# Patient Record
Sex: Female | Born: 1945 | Race: White | Hispanic: No | Marital: Married | Smoking: Never smoker
Health system: Southern US, Community
[De-identification: ages and names within clinical notes are randomized; demographics above are authoritative.]

## PROBLEM LIST (undated history)

## (undated) DIAGNOSIS — K5790 Diverticulosis of intestine, part unspecified, without perforation or abscess without bleeding: Secondary | ICD-10-CM

## (undated) DIAGNOSIS — G8929 Other chronic pain: Secondary | ICD-10-CM

## (undated) DIAGNOSIS — K317 Polyp of stomach and duodenum: Secondary | ICD-10-CM

## (undated) DIAGNOSIS — R011 Cardiac murmur, unspecified: Secondary | ICD-10-CM

## (undated) DIAGNOSIS — N189 Chronic kidney disease, unspecified: Secondary | ICD-10-CM

## (undated) DIAGNOSIS — E039 Hypothyroidism, unspecified: Secondary | ICD-10-CM

## (undated) DIAGNOSIS — M8580 Other specified disorders of bone density and structure, unspecified site: Secondary | ICD-10-CM

## (undated) DIAGNOSIS — Q239 Congenital malformation of aortic and mitral valves, unspecified: Secondary | ICD-10-CM

## (undated) DIAGNOSIS — D126 Benign neoplasm of colon, unspecified: Secondary | ICD-10-CM

## (undated) DIAGNOSIS — M545 Low back pain, unspecified: Secondary | ICD-10-CM

## (undated) DIAGNOSIS — I471 Supraventricular tachycardia, unspecified: Secondary | ICD-10-CM

## (undated) DIAGNOSIS — H269 Unspecified cataract: Secondary | ICD-10-CM

## (undated) DIAGNOSIS — I10 Essential (primary) hypertension: Secondary | ICD-10-CM

## (undated) DIAGNOSIS — M1990 Unspecified osteoarthritis, unspecified site: Secondary | ICD-10-CM

## (undated) DIAGNOSIS — E785 Hyperlipidemia, unspecified: Secondary | ICD-10-CM

## (undated) DIAGNOSIS — T7840XA Allergy, unspecified, initial encounter: Secondary | ICD-10-CM

## (undated) DIAGNOSIS — K219 Gastro-esophageal reflux disease without esophagitis: Secondary | ICD-10-CM

## (undated) DIAGNOSIS — M4800 Spinal stenosis, site unspecified: Secondary | ICD-10-CM

## (undated) DIAGNOSIS — C801 Malignant (primary) neoplasm, unspecified: Secondary | ICD-10-CM

## (undated) DIAGNOSIS — K589 Irritable bowel syndrome without diarrhea: Secondary | ICD-10-CM

## (undated) HISTORY — DX: Other chronic pain: G89.29

## (undated) HISTORY — DX: Spinal stenosis, site unspecified: M48.00

## (undated) HISTORY — DX: Gastro-esophageal reflux disease without esophagitis: K21.9

## (undated) HISTORY — PX: COLONOSCOPY: SHX174

## (undated) HISTORY — DX: Low back pain: M54.5

## (undated) HISTORY — DX: Hyperlipidemia, unspecified: E78.5

## (undated) HISTORY — DX: Essential (primary) hypertension: I10

## (undated) HISTORY — PX: ABDOMINAL HYSTERECTOMY: SHX81

## (undated) HISTORY — PX: COLONOSCOPY W/ BIOPSIES: SHX1374

## (undated) HISTORY — DX: Cardiac murmur, unspecified: R01.1

## (undated) HISTORY — DX: Benign neoplasm of colon, unspecified: D12.6

## (undated) HISTORY — PX: POLYPECTOMY: SHX149

## (undated) HISTORY — DX: Hypothyroidism, unspecified: E03.9

## (undated) HISTORY — DX: Unspecified cataract: H26.9

## (undated) HISTORY — DX: Chronic kidney disease, unspecified: N18.9

## (undated) HISTORY — DX: Supraventricular tachycardia: I47.1

## (undated) HISTORY — PX: COSMETIC SURGERY: SHX468

## (undated) HISTORY — DX: Congenital malformation of aortic and mitral valves, unspecified: Q23.9

## (undated) HISTORY — DX: Polyp of stomach and duodenum: K31.7

## (undated) HISTORY — DX: Supraventricular tachycardia, unspecified: I47.10

## (undated) HISTORY — DX: Irritable bowel syndrome, unspecified: K58.9

## (undated) HISTORY — PX: SHOULDER SURGERY: SHX246

## (undated) HISTORY — DX: Allergy, unspecified, initial encounter: T78.40XA

## (undated) HISTORY — DX: Low back pain, unspecified: M54.50

## (undated) HISTORY — DX: Other specified disorders of bone density and structure, unspecified site: M85.80

## (undated) HISTORY — DX: Unspecified osteoarthritis, unspecified site: M19.90

## (undated) HISTORY — DX: Diverticulosis of intestine, part unspecified, without perforation or abscess without bleeding: K57.90

## (undated) HISTORY — DX: Malignant (primary) neoplasm, unspecified: C80.1

---

## 2000-02-21 ENCOUNTER — Encounter: Admission: RE | Admit: 2000-02-21 | Discharge: 2000-02-21 | Payer: Self-pay | Admitting: Internal Medicine

## 2000-02-21 ENCOUNTER — Encounter: Payer: Self-pay | Admitting: Internal Medicine

## 2001-02-23 ENCOUNTER — Encounter: Payer: Self-pay | Admitting: Internal Medicine

## 2001-02-23 ENCOUNTER — Encounter: Admission: RE | Admit: 2001-02-23 | Discharge: 2001-02-23 | Payer: Self-pay | Admitting: Internal Medicine

## 2001-05-19 ENCOUNTER — Other Ambulatory Visit: Admission: RE | Admit: 2001-05-19 | Discharge: 2001-05-19 | Payer: Self-pay | Admitting: Obstetrics and Gynecology

## 2001-12-17 ENCOUNTER — Ambulatory Visit (HOSPITAL_COMMUNITY): Admission: RE | Admit: 2001-12-17 | Discharge: 2001-12-17 | Payer: Self-pay | Admitting: Internal Medicine

## 2001-12-17 ENCOUNTER — Encounter (INDEPENDENT_AMBULATORY_CARE_PROVIDER_SITE_OTHER): Payer: Self-pay

## 2002-03-01 ENCOUNTER — Encounter: Admission: RE | Admit: 2002-03-01 | Discharge: 2002-03-01 | Payer: Self-pay | Admitting: Obstetrics and Gynecology

## 2002-03-01 ENCOUNTER — Encounter: Payer: Self-pay | Admitting: Internal Medicine

## 2003-03-03 ENCOUNTER — Encounter: Admission: RE | Admit: 2003-03-03 | Discharge: 2003-03-03 | Payer: Self-pay | Admitting: Internal Medicine

## 2004-02-28 ENCOUNTER — Ambulatory Visit: Payer: Self-pay | Admitting: Internal Medicine

## 2004-03-04 ENCOUNTER — Ambulatory Visit (HOSPITAL_COMMUNITY): Admission: RE | Admit: 2004-03-04 | Discharge: 2004-03-04 | Payer: Self-pay | Admitting: Internal Medicine

## 2004-03-06 ENCOUNTER — Ambulatory Visit: Payer: Self-pay | Admitting: Internal Medicine

## 2004-05-17 ENCOUNTER — Other Ambulatory Visit: Admission: RE | Admit: 2004-05-17 | Discharge: 2004-05-17 | Payer: Self-pay | Admitting: Obstetrics and Gynecology

## 2004-05-22 ENCOUNTER — Ambulatory Visit: Payer: Self-pay | Admitting: Internal Medicine

## 2004-06-19 ENCOUNTER — Ambulatory Visit: Payer: Self-pay | Admitting: Family Medicine

## 2004-08-20 ENCOUNTER — Ambulatory Visit: Payer: Self-pay | Admitting: Internal Medicine

## 2004-11-22 ENCOUNTER — Ambulatory Visit: Payer: Self-pay | Admitting: Internal Medicine

## 2004-12-03 ENCOUNTER — Ambulatory Visit: Payer: Self-pay | Admitting: Internal Medicine

## 2004-12-06 ENCOUNTER — Ambulatory Visit: Payer: Self-pay | Admitting: Internal Medicine

## 2004-12-06 ENCOUNTER — Encounter: Payer: Self-pay | Admitting: Internal Medicine

## 2005-03-04 ENCOUNTER — Ambulatory Visit: Payer: Self-pay | Admitting: Internal Medicine

## 2005-03-07 ENCOUNTER — Ambulatory Visit (HOSPITAL_COMMUNITY): Admission: RE | Admit: 2005-03-07 | Discharge: 2005-03-07 | Payer: Self-pay | Admitting: Obstetrics and Gynecology

## 2005-03-11 ENCOUNTER — Ambulatory Visit: Payer: Self-pay | Admitting: Internal Medicine

## 2005-07-04 ENCOUNTER — Ambulatory Visit: Payer: Self-pay | Admitting: Internal Medicine

## 2005-11-04 ENCOUNTER — Ambulatory Visit: Payer: Self-pay | Admitting: Internal Medicine

## 2005-11-10 ENCOUNTER — Ambulatory Visit: Payer: Self-pay | Admitting: Internal Medicine

## 2006-02-12 ENCOUNTER — Ambulatory Visit: Payer: Self-pay | Admitting: Internal Medicine

## 2006-02-12 LAB — CONVERTED CEMR LAB
ALT: 15 units/L (ref 0–40)
AST: 12 units/L (ref 0–37)
Albumin: 3.6 g/dL (ref 3.5–5.2)
Alkaline Phosphatase: 71 units/L (ref 39–117)
BUN: 15 mg/dL (ref 6–23)
Basophils Absolute: 0 10*3/uL (ref 0.0–0.1)
Basophils Relative: 0.4 % (ref 0.0–1.0)
CO2: 32 meq/L (ref 19–32)
Calcium: 8.7 mg/dL (ref 8.4–10.5)
Chloride: 103 meq/L (ref 96–112)
Chol/HDL Ratio, serum: 4.6
Cholesterol: 163 mg/dL (ref 0–200)
Creatinine, Ser: 0.9 mg/dL (ref 0.4–1.2)
Eosinophil percent: 1.9 % (ref 0.0–5.0)
GFR calc non Af Amer: 67 mL/min
Glomerular Filtration Rate, Af Am: 82 mL/min/{1.73_m2}
Glucose, Bld: 103 mg/dL — ABNORMAL HIGH (ref 70–99)
HCT: 36.4 % (ref 36.0–46.0)
HDL: 35.7 mg/dL — ABNORMAL LOW (ref >39.0)
Hemoglobin: 12.3 g/dL (ref 12.0–15.0)
LDL Cholesterol: 116 mg/dL — ABNORMAL HIGH (ref 0–99)
Lymphocytes Relative: 24.1 % (ref 12.0–46.0)
MCHC: 33.8 g/dL (ref 30.0–36.0)
MCV: 90.9 fL (ref 78.0–100.0)
Monocytes Absolute: 0.3 10*3/uL (ref 0.2–0.7)
Monocytes Relative: 7.4 % (ref 3.0–11.0)
Neutro Abs: 2.8 10*3/uL (ref 1.4–7.7)
Neutrophils Relative %: 66.2 % (ref 43.0–77.0)
Platelets: 230 10*3/uL (ref 150–400)
Potassium: 3.5 meq/L (ref 3.5–5.1)
RBC: 4 M/uL (ref 3.87–5.11)
RDW: 12.2 % (ref 11.5–14.6)
Sodium: 141 meq/L (ref 135–145)
TSH: 0.8 microintl units/mL (ref 0.35–5.50)
Total Bilirubin: 0.7 mg/dL (ref 0.3–1.2)
Total Protein: 6 g/dL (ref 6.0–8.3)
Triglyceride fasting, serum: 59 mg/dL (ref 0–149)
VLDL: 12 mg/dL (ref 0–40)
WBC: 4.2 10*3/uL — ABNORMAL LOW (ref 4.5–10.5)

## 2006-02-19 ENCOUNTER — Ambulatory Visit: Payer: Self-pay | Admitting: Internal Medicine

## 2006-03-09 ENCOUNTER — Ambulatory Visit (HOSPITAL_COMMUNITY): Admission: RE | Admit: 2006-03-09 | Discharge: 2006-03-09 | Payer: Self-pay | Admitting: Obstetrics and Gynecology

## 2006-06-18 ENCOUNTER — Ambulatory Visit: Payer: Self-pay | Admitting: Internal Medicine

## 2006-09-16 ENCOUNTER — Ambulatory Visit: Payer: Self-pay | Admitting: Internal Medicine

## 2006-11-03 DIAGNOSIS — E039 Hypothyroidism, unspecified: Secondary | ICD-10-CM

## 2006-11-03 DIAGNOSIS — N951 Menopausal and female climacteric states: Secondary | ICD-10-CM

## 2006-11-03 DIAGNOSIS — Z86010 Personal history of colonic polyps: Secondary | ICD-10-CM

## 2006-11-03 DIAGNOSIS — I10 Essential (primary) hypertension: Secondary | ICD-10-CM

## 2006-11-27 ENCOUNTER — Telehealth: Payer: Self-pay | Admitting: Internal Medicine

## 2007-01-01 ENCOUNTER — Ambulatory Visit: Payer: Self-pay | Admitting: Internal Medicine

## 2007-03-01 ENCOUNTER — Telehealth: Payer: Self-pay | Admitting: Internal Medicine

## 2007-03-01 ENCOUNTER — Ambulatory Visit: Payer: Self-pay | Admitting: Internal Medicine

## 2007-03-12 ENCOUNTER — Ambulatory Visit (HOSPITAL_COMMUNITY): Admission: RE | Admit: 2007-03-12 | Discharge: 2007-03-12 | Payer: Self-pay | Admitting: Obstetrics and Gynecology

## 2007-04-27 ENCOUNTER — Ambulatory Visit: Payer: Self-pay | Admitting: Internal Medicine

## 2007-04-27 LAB — CONVERTED CEMR LAB
ALT: 15 units/L (ref 0–35)
AST: 13 units/L (ref 0–37)
Albumin: 3.6 g/dL (ref 3.5–5.2)
Alkaline Phosphatase: 76 units/L (ref 39–117)
BUN: 19 mg/dL (ref 6–23)
Basophils Absolute: 0 10*3/uL (ref 0.0–0.1)
Basophils Relative: 0.2 % (ref 0.0–1.0)
Bilirubin Urine: NEGATIVE
Bilirubin, Direct: 0.1 mg/dL (ref 0.0–0.3)
CO2: 33 meq/L — ABNORMAL HIGH (ref 19–32)
Calcium: 8.9 mg/dL (ref 8.4–10.5)
Chloride: 100 meq/L (ref 96–112)
Cholesterol: 186 mg/dL (ref 0–200)
Creatinine, Ser: 0.8 mg/dL (ref 0.4–1.2)
Eosinophils Absolute: 0.1 10*3/uL (ref 0.0–0.6)
Eosinophils Relative: 2.1 % (ref 0.0–5.0)
GFR calc Af Amer: 93 mL/min
GFR calc non Af Amer: 77 mL/min
Glucose, Bld: 106 mg/dL — ABNORMAL HIGH (ref 70–99)
Glucose, Urine, Semiquant: NEGATIVE
HCT: 34.8 % — ABNORMAL LOW (ref 36.0–46.0)
HDL: 38.2 mg/dL — ABNORMAL LOW (ref >39.0)
Hemoglobin: 12.4 g/dL (ref 12.0–15.0)
Ketones, urine, test strip: NEGATIVE
LDL Cholesterol: 130 mg/dL — ABNORMAL HIGH (ref 0–99)
Lymphocytes Relative: 22.7 % (ref 12.0–46.0)
MCHC: 35.7 g/dL (ref 30.0–36.0)
MCV: 87.4 fL (ref 78.0–100.0)
Monocytes Absolute: 0.3 10*3/uL (ref 0.2–0.7)
Monocytes Relative: 7.3 % (ref 3.0–11.0)
Neutro Abs: 3.1 10*3/uL (ref 1.4–7.7)
Neutrophils Relative %: 67.7 % (ref 43.0–77.0)
Nitrite: NEGATIVE
Platelets: 200 10*3/uL (ref 150–400)
Potassium: 3.8 meq/L (ref 3.5–5.1)
Protein, U semiquant: NEGATIVE
RBC: 3.99 M/uL (ref 3.87–5.11)
RDW: 12.7 % (ref 11.5–14.6)
Sodium: 139 meq/L (ref 135–145)
Specific Gravity, Urine: 1.02
TSH: 3.72 microintl units/mL (ref 0.35–5.50)
Total Bilirubin: 0.9 mg/dL (ref 0.3–1.2)
Total CHOL/HDL Ratio: 4.9
Total Protein: 6 g/dL (ref 6.0–8.3)
Triglycerides: 87 mg/dL (ref 0–149)
Urobilinogen, UA: 0.2
VLDL: 17 mg/dL (ref 0–40)
WBC: 4.5 10*3/uL (ref 4.5–10.5)
pH: 7

## 2007-05-04 ENCOUNTER — Ambulatory Visit: Payer: Self-pay | Admitting: Internal Medicine

## 2007-06-15 ENCOUNTER — Ambulatory Visit: Payer: Self-pay | Admitting: Internal Medicine

## 2007-07-23 ENCOUNTER — Telehealth: Payer: Self-pay | Admitting: Internal Medicine

## 2007-09-01 ENCOUNTER — Ambulatory Visit: Payer: Self-pay | Admitting: Internal Medicine

## 2007-09-16 ENCOUNTER — Telehealth: Payer: Self-pay | Admitting: Internal Medicine

## 2007-09-22 ENCOUNTER — Encounter: Payer: Self-pay | Admitting: Internal Medicine

## 2007-12-06 ENCOUNTER — Ambulatory Visit: Payer: Self-pay | Admitting: Internal Medicine

## 2008-01-13 ENCOUNTER — Ambulatory Visit: Payer: Self-pay | Admitting: Internal Medicine

## 2008-01-18 ENCOUNTER — Encounter: Payer: Self-pay | Admitting: Internal Medicine

## 2008-02-22 ENCOUNTER — Telehealth: Payer: Self-pay | Admitting: Internal Medicine

## 2008-03-02 ENCOUNTER — Telehealth: Payer: Self-pay | Admitting: Internal Medicine

## 2008-03-10 ENCOUNTER — Telehealth: Payer: Self-pay | Admitting: Internal Medicine

## 2008-03-15 ENCOUNTER — Telehealth (INDEPENDENT_AMBULATORY_CARE_PROVIDER_SITE_OTHER): Payer: Self-pay

## 2008-03-15 ENCOUNTER — Ambulatory Visit (HOSPITAL_COMMUNITY): Admission: RE | Admit: 2008-03-15 | Discharge: 2008-03-15 | Payer: Self-pay | Admitting: Obstetrics and Gynecology

## 2008-04-28 ENCOUNTER — Ambulatory Visit: Payer: Self-pay | Admitting: Internal Medicine

## 2008-04-28 LAB — CONVERTED CEMR LAB
ALT: 12 units/L (ref 0–35)
AST: 14 units/L (ref 0–37)
Albumin: 4 g/dL (ref 3.5–5.2)
Alkaline Phosphatase: 77 units/L (ref 39–117)
BUN: 24 mg/dL — ABNORMAL HIGH (ref 6–23)
Basophils Absolute: 0 10*3/uL (ref 0.0–0.1)
Basophils Relative: 0.7 % (ref 0.0–3.0)
Bilirubin Urine: NEGATIVE
Bilirubin, Direct: 0.1 mg/dL (ref 0.0–0.3)
Blood in Urine, dipstick: NEGATIVE
CO2: 32 meq/L (ref 19–32)
Calcium: 9 mg/dL (ref 8.4–10.5)
Chloride: 104 meq/L (ref 96–112)
Cholesterol: 191 mg/dL (ref 0–200)
Creatinine, Ser: 0.9 mg/dL (ref 0.4–1.2)
Eosinophils Absolute: 0.1 10*3/uL (ref 0.0–0.7)
Eosinophils Relative: 2.5 % (ref 0.0–5.0)
GFR calc Af Amer: 81 mL/min
GFR calc non Af Amer: 67 mL/min
Glucose, Bld: 112 mg/dL — ABNORMAL HIGH (ref 70–99)
Glucose, Urine, Semiquant: NEGATIVE
HCT: 37.1 % (ref 36.0–46.0)
HDL: 42.5 mg/dL (ref >39.0)
Hemoglobin: 12.9 g/dL (ref 12.0–15.0)
Ketones, urine, test strip: NEGATIVE
LDL Cholesterol: 130 mg/dL — ABNORMAL HIGH (ref 0–99)
Lymphocytes Relative: 22.7 % (ref 12.0–46.0)
MCHC: 34.7 g/dL (ref 30.0–36.0)
MCV: 90.2 fL (ref 78.0–100.0)
Monocytes Absolute: 0.4 10*3/uL (ref 0.1–1.0)
Monocytes Relative: 7.7 % (ref 3.0–12.0)
Neutro Abs: 3.2 10*3/uL (ref 1.4–7.7)
Neutrophils Relative %: 66.4 % (ref 43.0–77.0)
Nitrite: NEGATIVE
Platelets: 203 10*3/uL (ref 150–400)
Potassium: 4 meq/L (ref 3.5–5.1)
RBC: 4.11 M/uL (ref 3.87–5.11)
RDW: 12.3 % (ref 11.5–14.6)
Sodium: 141 meq/L (ref 135–145)
Specific Gravity, Urine: 1.015
TSH: 3.76 microintl units/mL (ref 0.35–5.50)
Total Bilirubin: 0.8 mg/dL (ref 0.3–1.2)
Total CHOL/HDL Ratio: 4.5
Total Protein: 6.5 g/dL (ref 6.0–8.3)
Triglycerides: 93 mg/dL (ref 0–149)
Urobilinogen, UA: 0.2
VLDL: 19 mg/dL (ref 0–40)
WBC Urine, dipstick: NEGATIVE
WBC: 4.8 10*3/uL (ref 4.5–10.5)
pH: 7

## 2008-05-04 ENCOUNTER — Encounter: Payer: Self-pay | Admitting: Internal Medicine

## 2008-05-05 ENCOUNTER — Ambulatory Visit: Payer: Self-pay | Admitting: Internal Medicine

## 2008-09-12 ENCOUNTER — Telehealth: Payer: Self-pay | Admitting: Internal Medicine

## 2008-09-14 ENCOUNTER — Telehealth: Payer: Self-pay | Admitting: Internal Medicine

## 2008-09-15 ENCOUNTER — Ambulatory Visit: Payer: Self-pay | Admitting: Internal Medicine

## 2008-12-26 ENCOUNTER — Telehealth (INDEPENDENT_AMBULATORY_CARE_PROVIDER_SITE_OTHER): Payer: Self-pay | Admitting: *Deleted

## 2009-02-16 ENCOUNTER — Ambulatory Visit: Payer: Self-pay | Admitting: Internal Medicine

## 2009-03-20 ENCOUNTER — Ambulatory Visit (HOSPITAL_COMMUNITY): Admission: RE | Admit: 2009-03-20 | Discharge: 2009-03-20 | Payer: Self-pay | Admitting: Obstetrics and Gynecology

## 2009-04-02 ENCOUNTER — Telehealth: Payer: Self-pay | Admitting: Internal Medicine

## 2009-04-10 ENCOUNTER — Telehealth: Payer: Self-pay | Admitting: Internal Medicine

## 2009-04-21 HISTORY — PX: COLON SURGERY: SHX602

## 2009-04-21 HISTORY — PX: LUMBAR EPIDURAL INJECTION: SHX1980

## 2009-08-09 ENCOUNTER — Ambulatory Visit: Payer: Self-pay | Admitting: Internal Medicine

## 2009-08-09 LAB — CONVERTED CEMR LAB
ALT: 12 units/L (ref 0–35)
AST: 14 units/L (ref 0–37)
Albumin: 3.9 g/dL (ref 3.5–5.2)
Alkaline Phosphatase: 89 units/L (ref 39–117)
BUN: 16 mg/dL (ref 6–23)
Basophils Absolute: 0 10*3/uL (ref 0.0–0.1)
Basophils Relative: 0.3 % (ref 0.0–3.0)
Bilirubin Urine: NEGATIVE
Bilirubin, Direct: 0 mg/dL (ref 0.0–0.3)
Blood in Urine, dipstick: NEGATIVE
CO2: 29 meq/L (ref 19–32)
Calcium: 8.7 mg/dL (ref 8.4–10.5)
Chloride: 102 meq/L (ref 96–112)
Cholesterol: 191 mg/dL (ref 0–200)
Creatinine, Ser: 0.9 mg/dL (ref 0.4–1.2)
Eosinophils Absolute: 0.2 10*3/uL (ref 0.0–0.7)
Eosinophils Relative: 3.8 % (ref 0.0–5.0)
GFR calc non Af Amer: 66.53 mL/min (ref >60)
Glucose, Bld: 108 mg/dL — ABNORMAL HIGH (ref 70–99)
Glucose, Urine, Semiquant: NEGATIVE
HCT: 36 % (ref 36.0–46.0)
HDL: 46.8 mg/dL (ref >39.00)
Hemoglobin: 12.2 g/dL (ref 12.0–15.0)
Ketones, urine, test strip: NEGATIVE
LDL Cholesterol: 122 mg/dL — ABNORMAL HIGH (ref 0–99)
Lymphocytes Relative: 12.6 % (ref 12.0–46.0)
Lymphs Abs: 0.7 10*3/uL (ref 0.7–4.0)
MCHC: 33.8 g/dL (ref 30.0–36.0)
MCV: 89.3 fL (ref 78.0–100.0)
Monocytes Absolute: 0.5 10*3/uL (ref 0.1–1.0)
Monocytes Relative: 9.1 % (ref 3.0–12.0)
Neutro Abs: 3.9 10*3/uL (ref 1.4–7.7)
Neutrophils Relative %: 74.2 % (ref 43.0–77.0)
Nitrite: NEGATIVE
Platelets: 206 10*3/uL (ref 150.0–400.0)
Potassium: 3.8 meq/L (ref 3.5–5.1)
Protein, U semiquant: NEGATIVE
RBC: 4.04 M/uL (ref 3.87–5.11)
RDW: 13.6 % (ref 11.5–14.6)
Sodium: 137 meq/L (ref 135–145)
Specific Gravity, Urine: 1.02
TSH: 7.15 microintl units/mL — ABNORMAL HIGH (ref 0.35–5.50)
Total Bilirubin: 0.6 mg/dL (ref 0.3–1.2)
Total CHOL/HDL Ratio: 4
Total Protein: 6.2 g/dL (ref 6.0–8.3)
Triglycerides: 113 mg/dL (ref 0.0–149.0)
Urobilinogen, UA: 0.2
VLDL: 22.6 mg/dL (ref 0.0–40.0)
WBC: 5.2 10*3/uL (ref 4.5–10.5)
pH: 6.5

## 2009-08-14 ENCOUNTER — Telehealth: Payer: Self-pay | Admitting: Internal Medicine

## 2009-08-16 ENCOUNTER — Ambulatory Visit: Payer: Self-pay | Admitting: Internal Medicine

## 2009-08-16 DIAGNOSIS — J45909 Unspecified asthma, uncomplicated: Secondary | ICD-10-CM

## 2009-08-26 ENCOUNTER — Emergency Department (HOSPITAL_COMMUNITY): Admission: EM | Admit: 2009-08-26 | Discharge: 2009-08-26 | Payer: Self-pay | Admitting: Emergency Medicine

## 2009-08-27 ENCOUNTER — Telehealth (INDEPENDENT_AMBULATORY_CARE_PROVIDER_SITE_OTHER): Payer: Self-pay | Admitting: *Deleted

## 2009-12-04 ENCOUNTER — Encounter (INDEPENDENT_AMBULATORY_CARE_PROVIDER_SITE_OTHER): Payer: Self-pay | Admitting: *Deleted

## 2009-12-05 ENCOUNTER — Encounter (INDEPENDENT_AMBULATORY_CARE_PROVIDER_SITE_OTHER): Payer: Self-pay | Admitting: *Deleted

## 2009-12-17 ENCOUNTER — Ambulatory Visit: Payer: Self-pay | Admitting: Internal Medicine

## 2009-12-17 DIAGNOSIS — M545 Low back pain, unspecified: Secondary | ICD-10-CM | POA: Insufficient documentation

## 2009-12-18 ENCOUNTER — Telehealth: Payer: Self-pay | Admitting: Internal Medicine

## 2010-01-08 ENCOUNTER — Encounter (INDEPENDENT_AMBULATORY_CARE_PROVIDER_SITE_OTHER): Payer: Self-pay | Admitting: *Deleted

## 2010-01-09 ENCOUNTER — Ambulatory Visit: Payer: Self-pay | Admitting: Internal Medicine

## 2010-01-19 LAB — HM COLONOSCOPY

## 2010-01-23 ENCOUNTER — Ambulatory Visit: Payer: Self-pay | Admitting: Internal Medicine

## 2010-01-23 DIAGNOSIS — K5790 Diverticulosis of intestine, part unspecified, without perforation or abscess without bleeding: Secondary | ICD-10-CM

## 2010-01-23 HISTORY — DX: Diverticulosis of intestine, part unspecified, without perforation or abscess without bleeding: K57.90

## 2010-02-07 ENCOUNTER — Encounter: Payer: Self-pay | Admitting: Internal Medicine

## 2010-02-15 ENCOUNTER — Encounter: Payer: Self-pay | Admitting: Internal Medicine

## 2010-02-15 ENCOUNTER — Inpatient Hospital Stay (HOSPITAL_COMMUNITY): Admission: RE | Admit: 2010-02-15 | Discharge: 2010-02-19 | Payer: Self-pay | Admitting: Surgery

## 2010-02-15 ENCOUNTER — Encounter (INDEPENDENT_AMBULATORY_CARE_PROVIDER_SITE_OTHER): Payer: Self-pay | Admitting: Surgery

## 2010-02-15 HISTORY — PX: HEMICOLECTOMY: SHX854

## 2010-02-26 ENCOUNTER — Encounter: Payer: Self-pay | Admitting: Internal Medicine

## 2010-03-06 ENCOUNTER — Encounter: Payer: Self-pay | Admitting: Internal Medicine

## 2010-03-21 ENCOUNTER — Encounter: Payer: Self-pay | Admitting: Internal Medicine

## 2010-03-22 ENCOUNTER — Ambulatory Visit (HOSPITAL_COMMUNITY)
Admission: RE | Admit: 2010-03-22 | Discharge: 2010-03-22 | Payer: Self-pay | Source: Home / Self Care | Admitting: Obstetrics and Gynecology

## 2010-04-08 ENCOUNTER — Ambulatory Visit: Payer: Self-pay | Admitting: Internal Medicine

## 2010-04-21 HISTORY — PX: MELANOMA EXCISION: SHX5266

## 2010-04-25 ENCOUNTER — Telehealth: Payer: Self-pay | Admitting: Internal Medicine

## 2010-04-26 ENCOUNTER — Emergency Department (HOSPITAL_COMMUNITY)
Admission: EM | Admit: 2010-04-26 | Discharge: 2010-04-27 | Payer: Self-pay | Source: Home / Self Care | Admitting: Emergency Medicine

## 2010-04-30 ENCOUNTER — Ambulatory Visit
Admission: RE | Admit: 2010-04-30 | Discharge: 2010-04-30 | Payer: Self-pay | Source: Home / Self Care | Attending: Internal Medicine | Admitting: Internal Medicine

## 2010-04-30 DIAGNOSIS — R109 Unspecified abdominal pain: Secondary | ICD-10-CM | POA: Insufficient documentation

## 2010-05-06 LAB — LIPASE, BLOOD: Lipase: 29 U/L (ref 11–59)

## 2010-05-06 LAB — URINALYSIS, ROUTINE W REFLEX MICROSCOPIC
Bilirubin Urine: NEGATIVE
Hgb urine dipstick: NEGATIVE
Ketones, ur: 15 mg/dL — AB
Nitrite: NEGATIVE
Protein, ur: NEGATIVE mg/dL
Specific Gravity, Urine: 1.015 (ref 1.005–1.030)
Urine Glucose, Fasting: NEGATIVE mg/dL
Urobilinogen, UA: 1 mg/dL (ref 0.0–1.0)
pH: 5.5 (ref 5.0–8.0)

## 2010-05-06 LAB — DIFFERENTIAL
Basophils Absolute: 0 10*3/uL (ref 0.0–0.1)
Basophils Relative: 0 % (ref 0–1)
Eosinophils Absolute: 0.1 10*3/uL (ref 0.0–0.7)
Eosinophils Relative: 2 % (ref 0–5)
Lymphocytes Relative: 18 % (ref 12–46)
Lymphs Abs: 1.4 10*3/uL (ref 0.7–4.0)
Monocytes Absolute: 0.7 10*3/uL (ref 0.1–1.0)
Monocytes Relative: 9 % (ref 3–12)
Neutro Abs: 5.4 10*3/uL (ref 1.7–7.7)
Neutrophils Relative %: 71 % (ref 43–77)

## 2010-05-06 LAB — COMPREHENSIVE METABOLIC PANEL
ALT: 17 U/L (ref 0–35)
AST: 16 U/L (ref 0–37)
Albumin: 3.6 g/dL (ref 3.5–5.2)
Alkaline Phosphatase: 83 U/L (ref 39–117)
BUN: 39 mg/dL — ABNORMAL HIGH (ref 6–23)
CO2: 28 mEq/L (ref 19–32)
Calcium: 9 mg/dL (ref 8.4–10.5)
Chloride: 101 mEq/L (ref 96–112)
Creatinine, Ser: 1.39 mg/dL — ABNORMAL HIGH (ref 0.4–1.2)
GFR calc Af Amer: 46 mL/min — ABNORMAL LOW (ref >60)
GFR calc non Af Amer: 38 mL/min — ABNORMAL LOW (ref >60)
Glucose, Bld: 121 mg/dL — ABNORMAL HIGH (ref 70–99)
Potassium: 3.6 mEq/L (ref 3.5–5.1)
Sodium: 140 mEq/L (ref 135–145)
Total Bilirubin: 0.5 mg/dL (ref 0.3–1.2)
Total Protein: 5.8 g/dL — ABNORMAL LOW (ref 6.0–8.3)

## 2010-05-06 LAB — URINE MICROSCOPIC-ADD ON

## 2010-05-06 LAB — CBC
HCT: 34 % — ABNORMAL LOW (ref 36.0–46.0)
Hemoglobin: 11 g/dL — ABNORMAL LOW (ref 12.0–15.0)
MCH: 27.8 pg (ref 26.0–34.0)
MCHC: 32.4 g/dL (ref 30.0–36.0)
MCV: 86.1 fL (ref 78.0–100.0)
Platelets: 226 10*3/uL (ref 150–400)
RBC: 3.95 MIL/uL (ref 3.87–5.11)
RDW: 13.7 % (ref 11.5–15.5)
WBC: 7.6 10*3/uL (ref 4.0–10.5)

## 2010-05-06 LAB — PROTIME-INR
INR: 1.06 (ref 0.00–1.49)
Prothrombin Time: 14 seconds (ref 11.6–15.2)

## 2010-05-21 NOTE — Letter (Signed)
Summary: Previsit letter  [facility]  [address] [place], [place] [address]   Phone: [phone]  Fax: [phone]       12/05/2009 MRN: [id number]  Jasmine Wheeler [address] [place], [place]  [address]  Dear Ms. Colunga,  Welcome to the Gastroenterology Division at [facility].    You are scheduled to see a nurse for your pre-procedure visit on January 09, 2010 at 11:00am on the 3rd floor at [facility], [address]  We ask that you try to arrive at our office 15 minutes prior to your appointment time to allow for check-in.  Your nurse visit will consist of discussing your medical and surgical history, your immediate family medical history, and your medications.    Please bring a complete list of all your medications or, if you prefer, bring the medication bottles and we will list them.  We will need to be aware of both prescribed and over the counter drugs.  We will need to know exact dosage information as well.  If you are on blood thinners (Coumadin, Plavix, Aggrenox, Ticlid, etc.) please call our office today/prior to your appointment, as we need to consult with your physician about holding your medication.   Please be prepared to read and sign documents such as consent forms, a financial agreement, and acknowledgement forms.  If necessary, and with your consent, a friend or relative is welcome to sit-in on the nurse visit with you.  Please bring your insurance card so that we may make a copy of it.  If your insurance requires a referral to see a specialist, please bring your referral form from your primary care physician.  No co-pay is required for this nurse visit.     If you cannot keep your appointment, please call [phone] to cancel or reschedule prior to your appointment date.  This allows [place] the opportunity to schedule an appointment for another patient in need of care.    Thank you for choosing [facility] for your  medical needs.  We appreciate the opportunity to care for you.  Please visit [place] at our website  to learn more about our practice.                     Sincerely.                                                                                                                   The Gastroenterology Division

## 2010-05-21 NOTE — Miscellaneous (Signed)
Summary: [facility] Previsit/prep  Clinical Lists Changes  Medications: Added new medication of MOVIPREP 100 GM  SOLR (PEG-KCL-NACL-NASULF-NA ASC-C) As per prep instructions. - Signed Rx of MOVIPREP 100 GM  SOLR (PEG-KCL-NACL-NASULF-NA ASC-C) As per prep instructions.;  #1 x 0;  Signed;  Entered by: Wyona Almas RN;  Authorized by: Iva Boop MD, [facility];  Method used: Electronically to [facility] [address], [place], [place], [place]  [address], Ph: [phone], Fax: [phone] Observations: Added new observation of ALLERGY REV: Done (01/09/2010 10:48)    Prescriptions: MOVIPREP 100 GM  SOLR (PEG-KCL-NACL-NASULF-NA ASC-C) As per prep instructions.  #1 x 0   Entered by:   Wyona Almas RN   Authorized by:   Iva Boop MD, [facility]   Signed by:   Wyona Almas RN on 01/09/2010   Method used:   Electronically to        [facility]  [address] (retail)       [address]       [place], [place]  [address]       Ph: [phone]       Fax: [phone]   RxID:   [id number]

## 2010-05-21 NOTE — Letter (Signed)
Summary: Colonoscopy Letter  [facility]  [address] [place], [place] [address]   Phone: [phone]  Fax: [phone]      December 04, 2009 MRN: [id number]   Jasmine Wheeler [address] [place], [place]  [address]   Dear Ms. Walters,   According to your medical record, it is time for you to schedule a Colonoscopy. The American Cancer Society recommends this procedure as a method to detect early colon cancer. Patients with a family history of colon cancer, or a personal history of colon polyps or inflammatory bowel disease are at increased risk.  This letter has beeen generated based on the recommendations made at the time of your procedure. If you feel that in your particular situation this may no longer apply, please contact our office.  Please call our office at [phone] to schedule this appointment or to update your records at your earliest convenience.  Thank you for cooperating with [place] to provide you with the very best care possible.   Sincerely,   Iva Boop, M.D.  [facility] Gastroenterology Division [phone]

## 2010-05-21 NOTE — Procedures (Signed)
Summary: Colonoscopy  Patient: Jasmine Wheeler Note: All result statuses are Final unless otherwise noted.  Tests: (1) Colonoscopy (COL)   COL Colonoscopy           DONE     [facility]     [address]     [place], [place]  [address]           COLONOSCOPY PROCEDURE REPORT           PATIENT:  Jasmine Wheeler, Jasmine Wheeler  MR#:  [id number]     BIRTHDATE:  1946/02/21, 64 yrs. old  GENDER:  female     ENDOSCOPIST:  Iva Boop, MD, [facility]           PROCEDURE DATE:  01/23/2010     PROCEDURE:  Colonoscopy with biopsy and snare polypectomy     ASA CLASS:  Class II     INDICATIONS:  surveillance and high-risk screening, history of     pre-cancerous (adenomatous) colon polyps     MEDICATIONS:   Fentanyl 50 mcg IV, Versed 7 mg IV           DESCRIPTION OF PROCEDURE:   After the risks benefits and     alternatives of the procedure were thoroughly explained, informed     consent was obtained.  Digital rectal exam was performed and     revealed no abnormalities.   The [id number] endoscope was     introduced through the anus and advanced to the cecum, which was     identified by both the appendix and ileocecal valve, without     limitations.  The quality of the prep was good, using MoviPrep.     The instrument was then slowly withdrawn as the colon was fully     examined. Insertion: 3:07 minutes Withdrawal: 15:28 minutes     <<PROCEDUREIMAGES>>           FINDINGS:  There was a possible polyp (lobulated and sessile     appearance) seen at the appendiceal orifice. Multiple biopsies     were obtained and sent to pathology.  A sessile polyp was found in     the cecum. It was 6 - 7 mm in size. Polyp was snared without     cautery. Retrieval was successful. snare polyp  Severe     diverticulosis was found in the left colon.  This was otherwise a     normal examination of the colon.   Retroflexed views in the rectum     revealed no abnormalities.    The scope was then withdrawn from  the patient and the procedure completed.           COMPLICATIONS:  None     ENDOSCOPIC IMPRESSION:     1) Possible polyp at the appendiceal orifice - biopsied     2) 6 - 7 mm sessile polyp in the cecum     3) Severe diverticulosis in the left colon     4) Otherwise normal examination, good prep     5) Prior diminutive adenoma (2006)     6) mother dx colon cancer at 31, 2 uncles with colon cancer age of     onset not known           RECOMMENDATIONS:     If there is an adenoma at the appendix surgery will be required.           REPEAT EXAM:  In for Colonoscopy, pending biopsy results.  Iva Boop, MD, [facility]           CC:  The Patient           n.     eSIGNED:   Iva Boop at 01/23/2010 12:18 PM           Jasmine Wheeler, [id number]  Note: An exclamation mark (!) indicates a result that was not dispersed into the flowsheet. Document Creation Date: 01/23/2010 12:18 PM _______________________________________________________________________  (1) Order result status: Final Collection or observation date-time: 01/23/2010 12:08 Requested date-time:  Receipt date-time:  Reported date-time:  Referring Physician:   Ordering Physician: Stan Head [phone]) Specimen Source:  Source: Launa Grill Order Number: [id number] Lab site:   Appended Document: Colonoscopy     Procedures Next Due Date:    Colonoscopy: 01/2015

## 2010-05-21 NOTE — Progress Notes (Signed)
Summary: Call-A-Nurse Report    Call-A-Nurse Triage Call Report Triage Record Num: [id number] Operator: Migdalia Dk Patient Name: Jasmine Wheeler Call Date & Time: 08/26/2009 1:06:24AM Patient Phone: PCP: Gordy Savers Patient Gender: Female PCP Fax : [phone] Patient DOB: 1945/12/09 Practice Name: [facility] Reason for Call: Husband states pt. is vomiting. Vomiting started around 2200, no diarrhea, afebrile. Homecare advice given. Protocol(s) Used: Nausea / Vomiting Recommended Outcome per Protocol: Provide Home/Self Care Reason for Outcome: New onset of 3-4 episodes vomiting or diarrhea following mild abdominal cramping Care Advice:  ~ Call provider if symptoms do not improve after 24 hours of home care. Vomiting and Diarrhea Care: - Do not eat solid foods until vomiting subsides. - Begin taking fluids by sucking on ice chips or popsicles or taking sips of cool, clear fluids (soda, fruit juices that are low acid, sports drinks or OTC oral rehydration solution). - Gradually drink larger amounts of these fluids so that you are drinking six to eight 8 oz. (1.2 to 1.6 liters) of fluids a day. - Keep activity to a minimum. - Once vomiting and diarrhea subside, eat smaller, more frequent meals of easily digested foods such as crackers, toast, bananas, rice, cooked cereal, applesauce, broth, baked or mashed potatoes, chicken or [place] without skin. Eat slowly. - Take fluids 30 minutes before or 60 minutes after meals. - Avoid high fat, highly seasoned, high fiber or high sugar content foods. - Avoid extremely hot or cold foods. - Do not take pain medication (such as aspirin, NSAIDs) while nauseated or vomiting. - Do not drink caffeinated or alcoholic beverages.  ~ Go to the ED if you have developed signs and symptoms of dehydration such as dry mouth and tongue; increased pulse rate at rest; concentrated urine; no urine output for 8 hours or more; increasing weakness  or drowsiness, or lightheadedness when trying to sit upright or standing.  ~ 08/26/2009 1:14:34AM Page 1 of 1 [id number]

## 2010-05-21 NOTE — Letter (Signed)
Summary: [facility] Instructions  [facility]  [address] [place], [place] [address]   Phone: [phone]  Fax: [phone]       SHAREN YOUNGREN    21-Nov-1945    MRN: [id number]        Procedure Day Dorna Bloom:  [facility] 01/23/10     Arrival Time: 9:30am     Procedure Time: 10:30am     Location of Procedure:                    _X _  [facility] (4th Floor)                       PREPARATION FOR COLONOSCOPY WITH MOVIPREP   Starting 5 days prior to your procedure FRIDAY 09/30  do not eat nuts, seeds, popcorn, corn, beans, peas,  salads, or any raw vegetables.  Do not take any fiber supplements (e.g. Metamucil, Citrucel, and Benefiber).  THE DAY BEFORE YOUR PROCEDURE         DATE: TUESDAY 01/22/10  1.  Drink clear liquids the entire day-NO SOLID FOOD  2.  Do not drink anything colored red or purple.  Avoid juices with pulp.  No orange juice.  3.  Drink at least 64 oz. (8 glasses) of fluid/clear liquids during the day to prevent dehydration and help the prep work efficiently.  CLEAR LIQUIDS INCLUDE: Water Jello Ice Popsicles Tea (sugar ok, no milk/cream) Powdered fruit flavored drinks Coffee (sugar ok, no milk/cream) Gatorade Juice: apple, white grape, white cranberry  Lemonade Clear bullion, consomm, broth Carbonated beverages (any kind) Strained chicken noodle soup Hard Candy                             4.  In the morning, mix first dose of MoviPrep solution:    Empty 1 Pouch A and 1 Pouch B into the disposable container    Add lukewarm drinking water to the top line of the container. Mix to dissolve    Refrigerate (mixed solution should be used within 24 hrs)  5.  Begin drinking the prep at 5:00 p.m. The MoviPrep container is divided by 4 marks.   Every 15 minutes drink the solution down to the next mark (approximately 8 oz) until the full liter is complete.   6.  Follow completed prep with 16 oz of clear liquid of your choice  (Nothing red or purple).  Continue to drink clear liquids until bedtime.  7.  Before going to bed, mix second dose of MoviPrep solution:    Empty 1 Pouch A and 1 Pouch B into the disposable container    Add lukewarm drinking water to the top line of the container. Mix to dissolve    Refrigerate  THE DAY OF YOUR PROCEDURE      DATE:  [facility] 01/23/10  Beginning at  5:30 a.m. (5 hours before procedure):         1. Every 15 minutes, drink the solution down to the next mark (approx 8 oz) until the full liter is complete.  2. Follow completed prep with 16 oz. of clear liquid of your choice.    3. You may drink clear liquids until 8:30am  (2 HOURS BEFORE PROCEDURE).   MEDICATION INSTRUCTIONS  Unless otherwise instructed, you should take regular prescription medications with a small sip of water   as early as possible the morning of your  procedure.   Additional medication instructions: Hold Spironolactone the morning of procedure.         OTHER INSTRUCTIONS  You will need a responsible adult at least 67 years of age to accompany you and drive you home.   This person must remain in the waiting room during your procedure.  Wear loose fitting clothing that is easily removed.  Leave jewelry and other valuables at home.  However, you may wish to bring a book to read or  an iPod/MP3 player to listen to music as you wait for your procedure to start.  Remove all body piercing jewelry and leave at home.  Total time from sign-in until discharge is approximately 2-3 hours.  You should go home directly after your procedure and rest.  You can resume normal activities the  day after your procedure.  The day of your procedure you should not:   Drive   Make legal decisions   Operate machinery   Drink alcohol   Return to work  You will receive specific instructions about eating, activities and medications before you leave.    The above instructions have been reviewed and  explained to me by  Wyona Almas RN  January 09, 2010 11:24 AM     I fully understand and can verbalize these instructions _____________________________ Date _________

## 2010-05-21 NOTE — Consult Note (Signed)
Summary: [facility]   Imported By: Maryln Gottron 02/19/2010 12:19:38  _____________________________________________________________________  External Attachment:    Type:   Image     Comment:   External Document

## 2010-05-21 NOTE — Letter (Signed)
Summary: [facility]   Imported By: Sherian Rein 03/20/2010 10:25:25  _____________________________________________________________________  External Attachment:    Type:   Image     Comment:   External Document

## 2010-05-21 NOTE — Assessment & Plan Note (Signed)
Summary: 4 month follow up/cjr   Vital Signs:  Patient profile:   65 year old female Weight:      172 pounds Temp:     98.0 degrees F oral BP sitting:   138 / 74  (right arm) Cuff size:   regular  Vitals Entered By: Duard Brady LPN (December 17, 2009 9:56 AM) CC: 4 mos rov - doing ok - being seen by ortho for back problems Is Patient Diabetic? No Flu Vaccine Consent Questions     Do you have a history of severe allergic reactions to this vaccine? no    Any prior history of allergic reactions to egg and/or gelatin? no    Do you have a sensitivity to the preservative Thimersol? no    Do you have a past history of Guillan-Barre Syndrome? no    Do you currently have an acute febrile illness? no    Have you ever had a severe reaction to latex? no    Vaccine information given and explained to patient? yes    Are you currently pregnant? no    Lot Number:[id number]   Exp Date:10/19/2010   Site Given  Left Deltoid IM   CC:  4 mos rov - doing ok - being seen by ortho for back problems.  History of Present Illness: 65 year old patient who is seen today for follow-up of her hypertension.  She has multi-drug-resistant hypertension, which has been well-controlled.  She has been followed closely by orthopedics due to low back pain.  This is associated with some right leg radiculopathy.  She has been treated aggressively but conservatively due to an apparent L3-4 disk.  She has hypothyroidism.  Laboratory studies were checked last spring.  She has a history of asthma, which has been stable  Allergies: 1)  Uniphyl 2)  Adalat Cc (Nifedipine) 3)  Proventil  Past History:  Past Medical History: Hypertension Hypothyroidism Colonic polyps, hx of Asthma Low back pain L3-4/ R radiculopathy ([facility])  Past Surgical History: Reviewed history from 08/16/2009 and no changes required. Hysterectomy complete Caesarean section Colonoscopy-12/06/2004 bone density 2006 2008,  02-2009  Review of Systems  The patient denies anorexia, fever, weight loss, weight gain, vision loss, decreased hearing, hoarseness, chest pain, syncope, dyspnea on exertion, peripheral edema, prolonged cough, headaches, hemoptysis, abdominal pain, melena, hematochezia, severe indigestion/heartburn, hematuria, incontinence, genital sores, muscle weakness, suspicious skin lesions, transient blindness, difficulty walking, depression, unusual weight change, abnormal bleeding, enlarged lymph nodes, angioedema, and breast masses.    Physical Exam  General:  overweight-appearing.  130/74overweight-appearing.   Head:  Normocephalic and atraumatic without obvious abnormalities. No apparent alopecia or balding. Eyes:  No corneal or conjunctival inflammation noted. EOMI. Perrla. Funduscopic exam benign, without hemorrhages, exudates or papilledema. Vision grossly normal. Mouth:  Oral mucosa and oropharynx without lesions or exudates.  Teeth in good repair. Neck:  No deformities, masses, or tenderness noted. Lungs:  Normal respiratory effort, chest expands symmetrically. Lungs are clear to auscultation, no crackles or wheezes. Heart:  Normal rate and regular rhythm. S1 and S2 normal without gallop, murmur, click, rub or other extra sounds. Abdomen:  Bowel sounds positive,abdomen soft and non-tender without masses, organomegaly or hernias noted. Msk:  No deformity or scoliosis noted of thoracic or lumbar spine.   Pulses:  R and L carotid,radial,femoral,dorsalis pedis and posterior tibial pulses are full and equal bilaterally Extremities:  No clubbing, cyanosis, edema, or deformity noted with normal full range of motion of all joints.     Impression &  Recommendations:  Problem # 1:  LOW BACK PAIN (ICD-724.2)  Her updated medication list for this problem includes:    Skelaxin 800 Mg Tabs (Metaxalone) ..... One three times daily as needed    Adult Aspirin Ec Low Strength 81 Mg Tbec (Aspirin) .Marland Kitchen... 1  once daily    Tramadol Hcl 50 Mg Tabs (Tramadol hcl) ..... Q6 hrs as needed pain    Hydrocodone-acetaminophen 5-325 Mg Tabs (Hydrocodone-acetaminophen) .Marland Kitchen... At bedtime as needed pain  Her updated medication list for this problem includes:    Skelaxin 800 Mg Tabs (Metaxalone) ..... One three times daily as needed    Adult Aspirin Ec Low Strength 81 Mg Tbec (Aspirin) .Marland Kitchen... 1 once daily    Tramadol Hcl 50 Mg Tabs (Tramadol hcl) ..... Q6 hrs as needed pain    Hydrocodone-acetaminophen 5-325 Mg Tabs (Hydrocodone-acetaminophen) .Marland Kitchen... At bedtime as needed pain  Problem # 2:  HYPERTENSION (ICD-401.9)  Her updated medication list for this problem includes:    Spironolactone 25 Mg Tabs (Spironolactone) .Marland Kitchen... 1 once daily    Quinapril Hcl 40 Mg Tabs (Quinapril hcl) .Marland Kitchen... 1 two times a day    Diltiazem Hcl Er Beads 360 Mg Xr24h-cap (Diltiazem hcl er beads) ..... One every morning    Her updated medication list for this problem includes:    Spironolactone 25 Mg Tabs (Spironolactone) .Marland Kitchen... 1 once daily    Quinapril Hcl 40 Mg Tabs (Quinapril hcl) .Marland Kitchen... 1 two times a day    Diltiazem Hcl Er Beads 360 Mg Xr24h-cap (Diltiazem hcl er beads) ..... One every morning  Orders: Prescription Created Electronically [phone])  Problem # 3:  HYPOTHYROIDISM (ICD-244.9)  Her updated medication list for this problem includes:    Synthroid 200 Mcg Tabs (Levothyroxine sodium) .Marland Kitchen... Take 1 tablet by mouth once a day  Her updated medication list for this problem includes:    Synthroid 200 Mcg Tabs (Levothyroxine sodium) .Marland Kitchen... Take 1 tablet by mouth once a day  Complete Medication List: 1)  Synthroid 200 Mcg Tabs (Levothyroxine sodium) .... Take 1 tablet by mouth once a day 2)  Zyrtec Allergy 10 Mg Tabs (Cetirizine hcl) .Marland Kitchen.. 1 once daily as needed 3)  Spironolactone 25 Mg Tabs (Spironolactone) .Marland Kitchen.. 1 once daily 4)  Quinapril Hcl 40 Mg Tabs (Quinapril hcl) .Marland Kitchen.. 1 two times a day 5)  Combivent 103-18 Mcg/act Aero  (Albuterol-ipratropium) .... Use as dir as needed 6)  Metoprolol Tartrate 50 Mg Tabs (metoprolol Tartrate)  .... One twice daily 7)  Skelaxin 800 Mg Tabs (Metaxalone) .... One three times daily as needed 8)  Adult Aspirin Ec Low Strength 81 Mg Tbec (Aspirin) .Marland Kitchen.. 1 once daily 9)  Diltiazem Hcl Er Beads 360 Mg Xr24h-cap (Diltiazem hcl er beads) .... One every morning 10)  Tramadol Hcl 50 Mg Tabs (Tramadol hcl) .... Q6 hrs as needed pain 11)  Hydrocodone-acetaminophen 5-325 Mg Tabs (Hydrocodone-acetaminophen) .... At bedtime as needed pain 12)  Vitamin C 500 Mg Tabs (Ascorbic acid) .... Qd 13)  Omega-3 Krill Oil 300 Mg Caps (Krill oil) .... Qd 14)  Caltrate 600+d 600-400 Mg-unit Tabs (Calcium carbonate-vitamin d) 15)  Multivitamins Tabs (Multiple vitamin) .... Qd  Other Orders: Admin 1st Vaccine (60454) Flu Vaccine 17yrs + (09811)  Patient Instructions: 1)  Please schedule a follow-up appointment in 4 months. 2)  Limit your Sodium (Salt). 3)  It is important that you exercise regularly at least 20 minutes 5 times a week. If you develop chest pain, have severe difficulty breathing, or  feel very tired , stop exercising immediately and seek medical attention. 4)  You need to lose weight. Consider a lower calorie diet and regular exercise.  Prescriptions: DILTIAZEM HCL ER BEADS 360 MG XR24H-CAP (DILTIAZEM HCL ER BEADS) one every morning  #90 x 6   Entered and Authorized by:   Gordy Savers  MD   Signed by:   Gordy Savers  MD on 12/17/2009   Method used:   Electronically to        [facility]  [address] (retail)       [address]       [place], [place]  [address]       Ph: [phone]       Fax: [phone]   RxID:   [id number] COMBIVENT 103-18 MCG/ACT  AERO (ALBUTEROL-IPRATROPIUM) use as dir as needed  #3 x 6   Entered and Authorized by:   Gordy Savers  MD   Signed by:   Gordy Savers  MD on 12/17/2009   Method used:   Electronically to         [facility]  [address] (retail)       [address]       [place], [place]  [address]       Ph: [phone]       Fax: [phone]   RxID:   [id number] QUINAPRIL HCL 40 MG  TABS (QUINAPRIL HCL) 1 two times a day  #180 x 6   Entered and Authorized by:   Gordy Savers  MD   Signed by:   Gordy Savers  MD on 12/17/2009   Method used:   Electronically to        [facility]  [address] [address] (retail)       [address]       [place], [place]  [address]       Ph: [phone]       Fax: [phone]   RxID:   [id number] SPIRONOLACTONE 25 MG  TABS (SPIRONOLACTONE) 1 once daily  #90 x 6   Entered and Authorized by:   Gordy Savers  MD   Signed by:   Gordy Savers  MD on 12/17/2009   Method used:   Electronically to        [facility]  [address] (retail)       [address]       [place], [place]  [address]       Ph: [phone]       Fax: [phone]   RxID:   [id number] SYNTHROID 200 MCG TABS (LEVOTHYROXINE SODIUM) Take 1 tablet by mouth once a day  #90 x 6   Entered and Authorized by:   Gordy Savers  MD   Signed by:   Gordy Savers  MD on 12/17/2009   Method used:   Electronically to        [facility]  [address] (retail)       [address]       [place], [place]  [address]       Ph: [phone]       Fax: [phone]   RxID:   [id number]

## 2010-05-21 NOTE — Assessment & Plan Note (Signed)
Summary: CPX /RS   Vital Signs:  Patient profile:   65 year old female Height:      62 inches Weight:      160 pounds BMI:     29.37 O2 Sat:      96 % on Room air Temp:     97.7 degrees F oral BP sitting:   122 / 60  (left arm) Cuff size:   regular  Vitals Entered By: Duard Brady LPN (August 16, 2009 9:35 AM)  O2 Flow:  Room air CC: cpx doing well colo do 11/2009 mammo/pap within normal limits   CC:  cpx doing well colo do 11/2009 mammo/pap within normal limits.  History of Present Illness: 65 year old patient who is seen today for a comprehensive evaluation.  Medical constant with a multi-drug-resistant hypertension.  She has hypothyroidism.  History colonic polyps and a history of mild asthma.  For the past, week she's had increasing chest congestion, cough, and some wheezing.  She is followed closely by gynecology.  Preventive Screening-Counseling & Management  Alcohol-Tobacco     Smoking Status: never  Allergies: 1)  Uniphyl 2)  Adalat Cc (Nifedipine) 3)  Proventil  Past History:  Past Medical History: Hypertension Hypothyroidism Colonic polyps, hx of Asthma  Past Surgical History: Hysterectomy complete Caesarean section Colonoscopy-12/06/2004 bone density 2006 2008, 02-2009  Family History: Reviewed history from 05/05/2008 and no changes required. Family History Lung cancer Family History Other cancer-Cervical Fam hx CAD Fam hx COPD Family History Diabetes 1st degree relative father died at age 17, lung cancer.   Mother history coronary disease, hypertension, COPD, hypothyroidism, CAD; history of rectal cancer  one brother one sister.  Positive for CAD  s/p CABG and cervical cancer (Sister died age 33) MGM-CNS Ca 2  maternal uncles-colon ca  Social History: Reviewed history from 05/05/2008 and no changes required. Married works at [employer] Status:  never  Review of Systems  The patient denies anorexia, fever, weight loss, weight gain,  vision loss, decreased hearing, hoarseness, chest pain, syncope, dyspnea on exertion, peripheral edema, prolonged cough, headaches, hemoptysis, abdominal pain, melena, hematochezia, severe indigestion/heartburn, hematuria, incontinence, genital sores, muscle weakness, suspicious skin lesions, transient blindness, difficulty walking, depression, unusual weight change, abnormal bleeding, enlarged lymph nodes, angioedema, and breast masses.    Physical Exam  General:  Well-developed,well-nourished,in no acute distress; alert,appropriate and cooperative throughout examination; 120/70 Head:  Normocephalic and atraumatic without obvious abnormalities. No apparent alopecia or balding. Eyes:  No corneal or conjunctival inflammation noted. EOMI. Perrla. Funduscopic exam benign, without hemorrhages, exudates or papilledema. Vision grossly normal. Ears:  External ear exam shows no significant lesions or deformities.  Otoscopic examination reveals clear canals, tympanic membranes are intact bilaterally without bulging, retraction, inflammation or discharge. Hearing is grossly normal bilaterally. Nose:  External nasal examination shows no deformity or inflammation. Nasal mucosa are pink and moist without lesions or exudates. Mouth:  Oral mucosa and oropharynx without lesions or exudates.  Teeth in good repair. Neck:  No deformities, masses, or tenderness noted. Chest Wall:  No deformities, masses, or tenderness noted. Breasts:  No mass, nodules, thickening, tenderness, bulging, retraction, inflamation, nipple discharge or skin changes noted.   Lungs:  Normal respiratory effort, chest expands symmetrically. Lungs are clear to auscultation, no crackles or wheezes. Heart:  Normal rate and regular rhythm. S1 and S2 normal without gallop, murmur, click, rub or other extra sounds. Abdomen:  Bowel sounds positive,abdomen soft and non-tender without masses, organomegaly or hernias noted. Msk:  No deformity or  scoliosis  noted of thoracic or lumbar spine.   Pulses:  R and L carotid,radial,femoral,dorsalis pedis and posterior tibial pulses are full and equal bilaterally Extremities:  No clubbing, cyanosis, edema, or deformity noted with normal full range of motion of all joints.   Neurologic:  No cranial nerve deficits noted. Station and gait are normal. Plantar reflexes are down-going bilaterally. DTRs are symmetrical throughout. Sensory, motor and coordinative functions appear intact. Skin:  Intact without suspicious lesions or rashes Cervical Nodes:  No lymphadenopathy noted Axillary Nodes:  No palpable lymphadenopathy Inguinal Nodes:  No significant adenopathy Psych:  Cognition and judgment appear intact. Alert and cooperative with normal attention span and concentration. No apparent delusions, illusions, hallucinations   Impression & Recommendations:  Problem # 1:  ASTHMA (ICD-493.90)  Her updated medication list for this problem includes:    Combivent 103-18 Mcg/act Aero (Albuterol-ipratropium) ..... Use as dir as needed  Her updated medication list for this problem includes:    Combivent 103-18 Mcg/act Aero (Albuterol-ipratropium) ..... Use as dir as needed  Problem # 2:  HYPOTHYROIDISM (ICD-244.9)  Her updated medication list for this problem includes:    Synthroid 200 Mcg Tabs (Levothyroxine sodium) .Marland Kitchen... Take 1 tablet by mouth once a day  Her updated medication list for this problem includes:    Synthroid 200 Mcg Tabs (Levothyroxine sodium) .Marland Kitchen... Take 1 tablet by mouth once a day  Problem # 3:  HYPERTENSION (ICD-401.9)  The following medications were removed from the medication list:    Diltiazem Hcl Cr 180 Mg Cp24 (Diltiazem hcl) .Marland Kitchen... 2 daily a.m. and 1 p.m. Her updated medication list for this problem includes:    Spironolactone 25 Mg Tabs (Spironolactone) .Marland Kitchen... 1 once daily    Quinapril Hcl 40 Mg Tabs (Quinapril hcl) .Marland Kitchen... 1 two times a day    Diltiazem Hcl Er Beads 360 Mg Xr24h-cap  (Diltiazem hcl er beads) ..... One every morning  Orders: EKG w/ Interpretation (93000)  Her updated medication list for this problem includes:    Spironolactone 25 Mg Tabs (Spironolactone) .Marland Kitchen... 1 once daily    Quinapril Hcl 40 Mg Tabs (Quinapril hcl) .Marland Kitchen... 1 two times a day    Diltiazem Hcl Cr 180 Mg Cp24 (Diltiazem hcl) .Marland Kitchen... 2 daily a.m. and 1 p.m.  Problem # 4:  Preventive Health Care (ICD-V70.0)  Complete Medication List: 1)  Synthroid 200 Mcg Tabs (Levothyroxine sodium) .... Take 1 tablet by mouth once a day 2)  Zyrtec Allergy 10 Mg Tabs (Cetirizine hcl) .Marland Kitchen.. 1 once daily as needed 3)  Spironolactone 25 Mg Tabs (Spironolactone) .Marland Kitchen.. 1 once daily 4)  Quinapril Hcl 40 Mg Tabs (Quinapril hcl) .Marland Kitchen.. 1 two times a day 5)  Combivent 103-18 Mcg/act Aero (Albuterol-ipratropium) .... Use as dir as needed 6)  Metoprolol Tartrate 50 Mg Tabs (metoprolol Tartrate)  .... One twice daily 7)  Skelaxin 800 Mg Tabs (Metaxalone) .... One three times daily as needed 8)  Adult Aspirin Ec Low Strength 81 Mg Tbec (Aspirin) .Marland Kitchen.. 1 once daily 9)  Diltiazem Hcl Er Beads 360 Mg Xr24h-cap (Diltiazem hcl er beads) .... One every morning  Patient Instructions: 1)  Please schedule a follow-up appointment in 4 months. 2)  Limit your Sodium (Salt). 3)  It is important that you exercise regularly at least 20 minutes 5 times a week. If you develop chest pain, have severe difficulty breathing, or feel very tired , stop exercising immediately and seek medical attention. 4)  You need to lose weight. Consider  a lower calorie diet and regular exercise.  5)  Schedule a colonoscopy/sigmoidoscopy to help detect colon cancer. 6)  Take calcium +Vitamin D daily. 7)  Check your Blood Pressure regularly. If it is above: 150/90 you should make an appointment. Prescriptions: DILTIAZEM HCL ER BEADS 360 MG XR24H-CAP (DILTIAZEM HCL ER BEADS) one every morning  #90 x 6   Entered and Authorized by:   Gordy Savers  MD    Signed by:   Gordy Savers  MD on 08/16/2009   Method used:   Print then Give to Patient   RxID:   [id number] SKELAXIN 800 MG  TABS (METAXALONE) one three times daily as needed  #40 x 0   Entered and Authorized by:   Gordy Savers  MD   Signed by:   Gordy Savers  MD on 08/16/2009   Method used:   Print then Give to Patient   RxID:   [id number] COMBIVENT 103-18 MCG/ACT  AERO (ALBUTEROL-IPRATROPIUM) use as dir as needed  #3 x 6   Entered and Authorized by:   Gordy Savers  MD   Signed by:   Gordy Savers  MD on 08/16/2009   Method used:   Print then Give to Patient   RxID:   [id number] QUINAPRIL HCL 40 MG  TABS (QUINAPRIL HCL) 1 two times a day  #180 x 6   Entered and Authorized by:   Gordy Savers  MD   Signed by:   Gordy Savers  MD on 08/16/2009   Method used:   Print then Give to Patient   RxID:   [id number] SPIRONOLACTONE 25 MG  TABS (SPIRONOLACTONE) 1 once daily  #90 x 6   Entered and Authorized by:   Gordy Savers  MD   Signed by:   Gordy Savers  MD on 08/16/2009   Method used:   Print then Give to Patient   RxID:   [id number] WCBJSE ALLERGY 10 MG TABS (CETIRIZINE HCL) 1 once daily as needed  #90 x 6   Entered and Authorized by:   Gordy Savers  MD   Signed by:   Gordy Savers  MD on 08/16/2009   Method used:   Print then Give to Patient   RxID:   [id number] SYNTHROID 200 MCG TABS (LEVOTHYROXINE SODIUM) Take 1 tablet by mouth once a day  #90 x 6   Entered and Authorized by:   Gordy Savers  MD   Signed by:   Gordy Savers  MD on 08/16/2009   Method used:   Print then Give to Patient   RxID:   [id number] DILTIAZEM HCL ER BEADS 360 MG XR24H-CAP (DILTIAZEM HCL ER BEADS) one every morning  #90 x 6   Entered and Authorized by:   Gordy Savers  MD   Signed by:   Gordy Savers  MD on 08/16/2009   Method used:   Electronically to        [facility]  [address] [facility] ([facility]ail)       [address]       [place], [place]  [address]       Ph: [phone]       Fax: [phone]   RxID:   [id number] DILTIAZEM HCL ER BEADS 360 MG XR24H-CAP (DILTIAZEM HCL ER BEADS) one every morning  #90 x 6   Entered and Authorized by:  Gordy Savers  MD   Signed by:   Gordy Savers  MD on 08/16/2009   Method used:   Electronically to        [facility] [address] (retail)       [address]       [place], [place]  [address]       Ph: [phone]       Fax: [phone]   RxID:   [id number] DILTIAZEM HCL CR 180 MG  CP24 (DILTIAZEM HCL) 2 daily a.m. and 1 p.m.  #270 x 3   Entered and Authorized by:   Gordy Savers  MD   Signed by:   Gordy Savers  MD on 08/16/2009   Method used:   Electronically to        [facility] [address] (retail)       [address]       [place], [place]  [address]       Ph: [phone]       Fax: [phone]   RxID:   [id number] SKELAXIN 800 MG  TABS (METAXALONE) one three times daily as needed  #40 x 0   Entered and Authorized by:   Gordy Savers  MD   Signed by:   Gordy Savers  MD on 08/16/2009   Method used:   Electronically to        [facility] [address] [facility] ([facility]ail)       [address]       [place], [place]  [address]       Ph: [phone]       Fax: [phone]   RxID:   [id number] METOPROLOL TARTRATE 50 MG  TABS (METOPROLOL TARTRATE) one twice daily  #180 x 6   Entered and Authorized by:   Gordy Savers  MD   Signed by:   Gordy Savers  MD on 08/16/2009   Method used:   Print then Give to Patient   RxID:   [id number] COMBIVENT 103-18 MCG/ACT  AERO (ALBUTEROL-IPRATROPIUM) use as dir as needed  #3 x 6   Entered and Authorized by:   Gordy Savers  MD   Signed by:   Gordy Savers  MD on 08/16/2009   Method used:   Electronically to        [facility] [address] [facility] ([facility]ail)        [address]       [place], [place]  [address]       Ph: [phone]       Fax: [phone]   RxID:   [id number] QUINAPRIL HCL 40 MG  TABS (QUINAPRIL HCL) 1 two times a day  #180 x 6   Entered and Authorized by:   Gordy Savers  MD   Signed by:   Gordy Savers  MD on 08/16/2009   Method used:   Electronically to        [facility] [address] [facility] ([facility])       [address]       [place], [place]  [address]       Ph: [phone]       Fax: [phone]   RxID:  [id number] SPIRONOLACTONE 25 MG  TABS (SPIRONOLACTONE) 1 once daily  #90 x 6   Entered and Authorized by:   Gordy Savers  MD   Signed by:   Gordy Savers  MD on 08/16/2009   Method used:   Electronically to        [facility] [address] (retail)       [address]       [place], [place]  [address]       Ph: [phone]       Fax: [phone]   RxID:   [id number] ZYRTEC ALLERGY 10 MG TABS (CETIRIZINE HCL) 1 once daily as needed  #90 x 6   Entered and Authorized by:   Gordy Savers  MD   Signed by:   Gordy Savers  MD on 08/16/2009   Method used:   Electronically to        [facility] [address] [facility])       [address]       [place], [place]  [address]       Ph: [phone]       Fax: [phone]   RxID:   [id number] SYNTHROID 200 MCG TABS (LEVOTHYROXINE SODIUM) Take 1 tablet by mouth once a day  #90 x 6   Entered and Authorized by:   Gordy Savers  MD   Signed by:   Gordy Savers  MD on 08/16/2009   Method used:   Electronically to        [facility] [address] [facility])       [address]       [place], [place]  [address]       Ph: [phone]       Fax: [phone]   RxID:   [id number]

## 2010-05-21 NOTE — Progress Notes (Signed)
Summary: Chest Cold  Phone Note Call from Patient Call back at [phone]   Caller: Patient Summary of Call: Cold deep in chest and has asthma would like to have something called to help break up the congestion. [facility]  Initial call taken by: Trixie Dredge,  August 14, 2009 10:14 AM  Follow-up for Phone Call        Joen Laura DM OTC Follow-up by: Gordy Savers  MD,  August 14, 2009 5:08 PM  Additional Follow-up for Phone Call Additional follow up Details #1::        called pt - told to use Muccinex DM but to make sure to take with a full glass of water - has cpx thurs - can address then if no better. KIK Additional Follow-up by: Duard Brady LPN,  August 14, 2009 5:20 PM

## 2010-05-21 NOTE — Progress Notes (Signed)
Summary: [facility]  Phone Note Call from Patient Call back at Home Phone [phone]   Caller: Patient---live call Summary of Call: was seen yesterday and her meds were sent to [facility] Please send the ones from yesterday to [facility]. Initial call taken by: Warnell Forester,  December 18, 2009 9:45 AM  Follow-up for Phone Call        [facility] - attempt to call pt - ans mach - LMTCB if questions - request completed.KIK Follow-up by: Duard Brady LPN,  December 18, 2009 10:00 AM    Prescriptions: DILTIAZEM HCL ER BEADS 360 MG XR24H-CAP (DILTIAZEM HCL ER BEADS) one every morning  #90 x 6   Entered by:   Duard Brady LPN   Authorized by:   Gordy Savers  MD   Signed by:   Duard Brady LPN on 09/81/1914   Method used:   Faxed to ...       [facility] education officer)       [address] [address]       [place], [place]  [address]       Ph: [phone]       Fax: [phone]   RxID:   [id number] COMBIVENT 103-18 MCG/ACT  AERO (ALBUTEROL-IPRATROPIUM) use as dir as needed  #3 x 6   Entered by:   Duard Brady LPN   Authorized by:   Gordy Savers  MD   Signed by:   Duard Brady LPN on 40/34/7425   Method used:   Faxed to ...       [facility] Environmental education officer)       [address] [address]       [place], [place]  [address]       Ph: [phone]       Fax: [phone]   RxID:   [id number] QUINAPRIL HCL 40 MG  TABS (QUINAPRIL HCL) 1 two times a day  #180 x 6   Entered by:   Duard Brady LPN   Authorized by:   Gordy Savers  MD   Signed by:   Duard Brady LPN on 25/42/7062   Method used:   Faxed to ...       [facility] Environmental education officer)       [address] [address]       [place], [place]  [address]       Ph: [phone]       Fax: [phone]   RxID:   [id number] SPIRONOLACTONE 25 MG  TABS (SPIRONOLACTONE) 1 once daily  #90 x 6   Entered by:   Duard Brady LPN   Authorized by:   Gordy Savers  MD   Signed by:   Duard Brady LPN on 99/37/1696   Method used:   Faxed to ...       [facility] Environmental education officer)       [address] [address]       [place], [place]  [address]       Ph: [phone]       Fax: [phone]   RxID:   [id number] SYNTHROID 200 MCG TABS (LEVOTHYROXINE SODIUM) Take 1 tablet by mouth once a day  #90 x 6   Entered by:   Duard Brady LPN   Authorized by:   Gordy Savers  MD   Signed by:   Duard Brady LPN on 95/12/3265   Method used:   Faxed to ...       [facility] Environmental education officer)  [address] [address]       [place], [place]  [address]       Ph: [phone]       Fax: [phone]   RxID:   [id number]

## 2010-05-22 ENCOUNTER — Encounter: Payer: Self-pay | Admitting: Emergency Medicine

## 2010-05-23 NOTE — Letter (Signed)
Summary: [facility]   Imported By: Sherian Rein 04/23/2010 10:52:41  _____________________________________________________________________  External Attachment:    Type:   Image     Comment:   External Document

## 2010-05-23 NOTE — Assessment & Plan Note (Signed)
Summary: 4 month fup//ccm   Vital Signs:  Patient profile:   65 year old female Weight:      173 pounds Temp:     98.2 degrees F oral BP sitting:   140 / 80  (left arm) Cuff size:   regular  Vitals Entered By: Duard Brady LPN (April 08, 2010 8:04 AM) CC: 4 mon rov---doing ok  Is Patient Diabetic? No   CC:  4 mon rov---doing ok .  History of Present Illness: 65 year old patient who is seen today for follow-up.  she has treated multi-drug-resistant hypertension, hypothyroidism, and also has a history of colonic polyps.  She recently underwent a cecal polypectomy for a tubular adenoma unresectable by colonoscopy.  She did well with her surgery and her blood pressure was not an issue.  She has chronic low back pain and a history of asthma, which has been stable.  Her weight has been stable.  Denies any chronic low back pain or any active wheezing.  Allergies: 1)  Uniphyl 2)  Adalat Cc (Nifedipine) 3)  Proventil  Past History:  Past Medical History: Reviewed history from 12/17/2009 and no changes required. Hypertension Hypothyroidism Colonic polyps, hx of Asthma Low back pain L3-4/ R radiculopathy ([facility])  Past Surgical History: Hysterectomy complete Caesarean section Colonoscopy-12/06/2004, 2011 bone density 2006 2008, 02-2009 status post partial colectomy October 2011  Family History: Reviewed history from 08/16/2009 and no changes required. Family History Lung cancer Family History Other cancer-Cervical Fam hx CAD Fam hx COPD Family History Diabetes 1st degree relative father died at age 6, lung cancer.   Mother history coronary disease, hypertension, COPD, hypothyroidism, CAD; history of rectal cancer  one brother one sister.  Positive for CAD  s/p CABG and cervical cancer (Sister died age 88) MGM-CNS Ca 2  maternal uncles-colon ca  Social History: Reviewed history from 05/05/2008 and no changes required. Married works at [employer]  Review  of Systems  The patient denies anorexia, fever, weight loss, weight gain, vision loss, decreased hearing, hoarseness, chest pain, syncope, dyspnea on exertion, peripheral edema, prolonged cough, headaches, hemoptysis, abdominal pain, melena, hematochezia, severe indigestion/heartburn, hematuria, incontinence, genital sores, muscle weakness, suspicious skin lesions, transient blindness, difficulty walking, depression, unusual weight change, abnormal bleeding, enlarged lymph nodes, angioedema, and breast masses.    Physical Exam  General:  overweight-appearing.  140/80overweight-appearing.   Head:  Normocephalic and atraumatic without obvious abnormalities. No apparent alopecia or balding. Eyes:  No corneal or conjunctival inflammation noted. EOMI. Perrla. Funduscopic exam benign, without hemorrhages, exudates or papilledema. Vision grossly normal. Mouth:  Oral mucosa and oropharynx without lesions or exudates.  Teeth in good repair. Neck:  No deformities, masses, or tenderness noted. Lungs:  Normal respiratory effort, chest expands symmetrically. Lungs are clear to auscultation, no crackles or wheezes. Heart:  Normal rate and regular rhythm. S1 and S2 normal without gallop, murmur, click, rub or other extra sounds. Abdomen:  Bowel sounds positive,abdomen soft and non-tender without masses, organomegaly or hernias noted. Msk:  No deformity or scoliosis noted of thoracic or lumbar spine.   Pulses:  R and L carotid,radial,femoral,dorsalis pedis and posterior tibial pulses are full and equal bilaterally Extremities:  No clubbing, cyanosis, edema, or deformity noted with normal full range of motion of all joints.     Impression & Recommendations:  Problem # 1:  ASTHMA (ICD-493.90)  Her updated medication list for this problem includes:    Combivent 103-18 Mcg/act Aero (Albuterol-ipratropium) ..... Use as dir as needed  Her updated medication list for this problem includes:    Combivent 103-18  Mcg/act Aero (Albuterol-ipratropium) ..... Use as dir as needed  Problem # 2:  COLONIC POLYPS, HX OF (ICD-V12.72)  Problem # 3:  HYPOTHYROIDISM (ICD-244.9)  Her updated medication list for this problem includes:    Synthroid 200 Mcg Tabs (Levothyroxine sodium) .Marland Kitchen... Take 1 tablet by mouth once a day  Her updated medication list for this problem includes:    Synthroid 200 Mcg Tabs (Levothyroxine sodium) .Marland Kitchen... Take 1 tablet by mouth once a day  Problem # 4:  HYPERTENSION (ICD-401.9)  Her updated medication list for this problem includes:    Spironolactone 25 Mg Tabs (Spironolactone) .Marland Kitchen... 1 once daily    Quinapril Hcl 40 Mg Tabs (Quinapril hcl) .Marland Kitchen... 1 two times a day    Diltiazem Hcl Er Beads 360 Mg Xr24h-cap (Diltiazem hcl er beads) ..... One every morning  Her updated medication list for this problem includes:    Spironolactone 25 Mg Tabs (Spironolactone) .Marland Kitchen... 1 once daily    Quinapril Hcl 40 Mg Tabs (Quinapril hcl) .Marland Kitchen... 1 two times a day    Diltiazem Hcl Er Beads 360 Mg Xr24h-cap (Diltiazem hcl er beads) ..... One every morning  Complete Medication List: 1)  Synthroid 200 Mcg Tabs (Levothyroxine sodium) .... Take 1 tablet by mouth once a day 2)  Zyrtec Allergy 10 Mg Tabs (Cetirizine hcl) .Marland Kitchen.. 1 once daily as needed 3)  Spironolactone 25 Mg Tabs (Spironolactone) .Marland Kitchen.. 1 once daily 4)  Quinapril Hcl 40 Mg Tabs (Quinapril hcl) .Marland Kitchen.. 1 two times a day 5)  Combivent 103-18 Mcg/act Aero (Albuterol-ipratropium) .... Use as dir as needed 6)  Metoprolol Tartrate 50 Mg Tabs (metoprolol Tartrate)  .... One twice daily 7)  Skelaxin 800 Mg Tabs (Metaxalone) .... One three times daily as needed 8)  Adult Aspirin Ec Low Strength 81 Mg Tbec (Aspirin) .Marland Kitchen.. 1 once daily 9)  Diltiazem Hcl Er Beads 360 Mg Xr24h-cap (Diltiazem hcl er beads) .... One every morning 10)  Tramadol Hcl 50 Mg Tabs (Tramadol hcl) .... Q6 hrs as needed pain 11)  Hydrocodone-acetaminophen 5-325 Mg Tabs  (Hydrocodone-acetaminophen) .... At bedtime as needed pain 12)  Vitamin C 500 Mg Tabs (Ascorbic acid) .... Qd 13)  Omega-3 Krill Oil 300 Mg Caps (Krill oil) .... Qd 14)  Caltrate 600+d 600-400 Mg-unit Tabs (Calcium carbonate-vitamin d) 15)  Multivitamins Tabs (Multiple vitamin) .... Qd  Patient Instructions: 1)  Please schedule a follow-up appointment in 4 months. 2)  Limit your Sodium (Salt). 3)  It is important that you exercise regularly at least 20 minutes 5 times a week. If you develop chest pain, have severe difficulty breathing, or feel very tired , stop exercising immediately and seek medical attention. 4)  You need to lose weight. Consider a lower calorie diet and regular exercise.  Prescriptions: DILTIAZEM HCL ER BEADS 360 MG XR24H-CAP (DILTIAZEM HCL ER BEADS) one every morning  #90 x 6   Entered and Authorized by:   Gordy Savers  MD   Signed by:   Gordy Savers  MD on 04/08/2010   Method used:   Print then Give to Patient   RxID:   [id number] METOPROLOL TARTRATE 50 MG  TABS (METOPROLOL TARTRATE) one twice daily  #180 x 6   Entered and Authorized by:   Gordy Savers  MD   Signed by:   Gordy Savers  MD on 04/08/2010   Method used:   Print then  Give to Patient   RxID:   [id number] COMBIVENT 103-18 MCG/ACT  AERO (ALBUTEROL-IPRATROPIUM) use as dir as needed  #3 x 6   Entered and Authorized by:   Gordy Savers  MD   Signed by:   Gordy Savers  MD on 04/08/2010   Method used:   Print then Give to Patient   RxID:   [id number] QUINAPRIL HCL 40 MG  TABS (QUINAPRIL HCL) 1 two times a day  #180 x 6   Entered and Authorized by:   Gordy Savers  MD   Signed by:   Gordy Savers  MD on 04/08/2010   Method used:   Print then Give to Patient   RxID:   [id number] SPIRONOLACTONE 25 MG  TABS (SPIRONOLACTONE) 1 once daily  #90 x 6   Entered and Authorized by:   Gordy Savers  MD   Signed by:   Gordy Savers   MD on 04/08/2010   Method used:   Print then Give to Patient   RxID:   [id number] SYNTHROID 200 MCG TABS (LEVOTHYROXINE SODIUM) Take 1 tablet by mouth once a day  #90 x 6   Entered and Authorized by:   Gordy Savers  MD   Signed by:   Gordy Savers  MD on 04/08/2010   Method used:   Print then Give to Patient   RxID:   [id number]    Orders Added: 1)  Est. Patient Level IV [51884]  Appended Document: 4 month fup//ccm     Allergies: 1)  Uniphyl 2)  Adalat Cc (Nifedipine) 3)  Proventil   Complete Medication List: 1)  Synthroid 200 Mcg Tabs (Levothyroxine sodium) .... Take 1 tablet by mouth once a day 2)  Zyrtec Allergy 10 Mg Tabs (Cetirizine hcl) .Marland Kitchen.. 1 once daily as needed 3)  Spironolactone 25 Mg Tabs (Spironolactone) .Marland Kitchen.. 1 once daily 4)  Quinapril Hcl 40 Mg Tabs (Quinapril hcl) .Marland Kitchen.. 1 two times a day 5)  Combivent 103-18 Mcg/act Aero (Albuterol-ipratropium) .... Use as dir as needed 6)  Metoprolol Tartrate 50 Mg Tabs (metoprolol Tartrate)  .... One twice daily 7)  Skelaxin 800 Mg Tabs (Metaxalone) .... One three times daily as needed 8)  Adult Aspirin Ec Low Strength 81 Mg Tbec (Aspirin) .Marland Kitchen.. 1 once daily 9)  Diltiazem Hcl Er Beads 360 Mg Xr24h-cap (Diltiazem hcl er beads) .... One every morning 10)  Tramadol Hcl 50 Mg Tabs (Tramadol hcl) .... Q6 hrs as needed pain 11)  Hydrocodone-acetaminophen 5-325 Mg Tabs (Hydrocodone-acetaminophen) .... At bedtime as needed pain 12)  Vitamin C 500 Mg Tabs (Ascorbic acid) .... Qd 13)  Omega-3 Krill Oil 300 Mg Caps (Krill oil) .... Qd 14)  Caltrate 600+d 600-400 Mg-unit Tabs (Calcium carbonate-vitamin d) 15)  Multivitamins Tabs (Multiple vitamin) .... Qd  Other Orders: Tdap => 88yrs IM (16606) Admin 1st Vaccine (30160)   Orders Added: 1)  Tdap => 108yrs IM [90715] 2)  Admin 1st Vaccine [10932]   Immunizations Administered:  Tetanus Vaccine:    Vaccine Type: Tdap    Site: right deltoid    Mfr:  GlaxoSmithKline    Dose: 0.5 ml    Route: IM    Given by: Duard Brady LPN    Exp. Date: 02/08/2012    Lot #: [id number]    VIS given: 03/08/08 version given April 08, 2010.    Physician counseled: yes   Immunizations Administered:  Tetanus Vaccine:    Vaccine Type: Tdap  Site: right deltoid    Mfr: GlaxoSmithKline    Dose: 0.5 ml    Route: IM    Given by: Duard Brady LPN    Exp. Date: 02/08/2012    Lot #: [id number]    VIS given: 03/08/08 version given April 08, 2010.    Physician counseled: yes

## 2010-05-23 NOTE — Progress Notes (Signed)
Summary: phenergan rx to [facility]  Phone Note Call from Patient Call back at Home Phone [phone]   Caller: Patient Call For: Jasmine Savers  MD Summary of Call: pt had nausea for last 2 day no fever. [facility] [place] [phone] Initial call taken by: Heron Sabins,  April 25, 2010 11:58 AM  Follow-up for Phone Call        phenergan suppos 25 mg #12 use every 4-6 hrs prn Follow-up by: Jasmine Savers  MD,  April 25, 2010 12:39 PM    New/Updated Medications: PROMETHAZINE HCL 25 MG SUPP (PROMETHAZINE HCL) 1 per rectum q6 hrs as needed nausea Prescriptions: PROMETHAZINE HCL 25 MG SUPP (PROMETHAZINE HCL) 1 per rectum q6 hrs as needed nausea  #12 x 0   Entered by:   Duard Brady LPN   Authorized by:   Jasmine Savers  MD   Signed by:   Duard Brady LPN on 84/69/6295   Method used:   Electronically to        [facility]  [address] (retail)       [address]       [place], [place]  [address]       Ph: [phone]       Fax: [phone]   RxID:   [id number]

## 2010-05-23 NOTE — Assessment & Plan Note (Signed)
Summary: er fup//ccm   Vital Signs:  Patient profile:   65 year old female Weight:      172 pounds Temp:     99.0 degrees F oral BP sitting:   120 / 80  (right arm) Cuff size:   regular  Vitals Entered By: Duard Brady LPN (April 30, 2010 11:25 AM) CC: f/u er - dx diverticulitis Is Patient Diabetic? No   CC:  f/u er - dx diverticulitis.  History of Present Illness: 65 year-old patient who is seen today for follow-up.  She was evaluated and treated in the emergency room 5 days ago after presenting with the nausea, vomiting, and upper abdominal pain.  There is some concern about a small bowel obstruction and cholelithiasis as well as pancreatitis.  Evaluation also included an abdominal CT and pelvic CT that revealed the diverticulosis and some nonspecific findings, but not inconsistent with early diverticulitis.  The patient also was noted to have a UTI and was discharged on both a regimen of Cipro and Flagyl.  Today she does quite well and has had no recurrent abdominal pain or vomiting.  She has developed a mild sore throat.  She has treated hypertension and asthma that has been stable.  Emergency  room x-rays and notes reviewed.  Laboratory studies reviewed.  Allergies: 1)  Uniphyl 2)  Adalat Cc (Nifedipine) 3)  Proventil  Past History:  Past Medical History: Reviewed history from 12/17/2009 and no changes required. Hypertension Hypothyroidism Colonic polyps, hx of Asthma Low back pain L3-4/ R radiculopathy ([facility])  Past Surgical History: Reviewed history from 04/08/2010 and no changes required. Hysterectomy complete Caesarean section Colonoscopy-12/06/2004, 2011 bone density 2006 2008, 02-2009 status post partial colectomy October 2011  Review of Systems       The patient complains of anorexia and abdominal pain.  The patient denies fever, weight loss, weight gain, vision loss, decreased hearing, hoarseness, chest pain, syncope, dyspnea on exertion,  peripheral edema, prolonged cough, headaches, hemoptysis, melena, hematochezia, severe indigestion/heartburn, hematuria, incontinence, genital sores, muscle weakness, suspicious skin lesions, transient blindness, difficulty walking, depression, unusual weight change, abnormal bleeding, enlarged lymph nodes, angioedema, and breast masses.    Physical Exam  General:  Well-developed,well-nourished,in no acute distress; alert,appropriate and cooperative throughout examination Head:  Normocephalic and atraumatic without obvious abnormalities. No apparent alopecia or balding. Eyes:  No corneal or conjunctival inflammation noted. EOMI. Perrla. Funduscopic exam benign, without hemorrhages, exudates or papilledema. Vision grossly normal. Ears:  External ear exam shows no significant lesions or deformities.  Otoscopic examination reveals clear canals, tympanic membranes are intact bilaterally without bulging, retraction, inflammation or discharge. Hearing is grossly normal bilaterally. Mouth:  pharyngeal erythema.   Neck:  No deformities, masses, or tenderness noted. Lungs:  Normal respiratory effort, chest expands symmetrically. Lungs are clear to auscultation, no crackles or wheezes. Heart:  Normal rate and regular rhythm. S1 and S2 normal without gallop, murmur, click, rub or other extra sounds. Abdomen:  Bowel sounds positive,abdomen soft and non-tender without masses, organomegaly or hernias noted. active bowel sounds   Impression & Recommendations:  Problem # 1:  HYPERTENSION (ICD-401.9)  Her updated medication list for this problem includes:    Spironolactone 25 Mg Tabs (Spironolactone) .Marland Kitchen... 1 once daily    Quinapril Hcl 40 Mg Tabs (Quinapril hcl) .Marland Kitchen... 1 two times a day    Diltiazem Hcl Er Beads 360 Mg Xr24h-cap (Diltiazem hcl er beads) ..... One every morning  Problem # 2:  ASTHMA (ICD-493.90)  Her updated medication  list for this problem includes:    Combivent 103-18 Mcg/act Aero  (Albuterol-ipratropium) ..... Use as dir as needed  Problem # 3:  ABDOMINAL PAIN (ICD-789.00) the patient's upper abdominal pain, nausea and vomiting have resolved.  Clinically, this does not appear to be acute diverticulitis and will discontinue antibiotic therapy.  She was given samples of Dexilant  for 5 days  Complete Medication List: 1)  Synthroid 200 Mcg Tabs (Levothyroxine sodium) .... Take 1 tablet by mouth once a day 2)  Zyrtec Allergy 10 Mg Tabs (Cetirizine hcl) .Marland Kitchen.. 1 once daily as needed 3)  Spironolactone 25 Mg Tabs (Spironolactone) .Marland Kitchen.. 1 once daily 4)  Quinapril Hcl 40 Mg Tabs (Quinapril hcl) .Marland Kitchen.. 1 two times a day 5)  Combivent 103-18 Mcg/act Aero (Albuterol-ipratropium) .... Use as dir as needed 6)  Metoprolol Tartrate 50 Mg Tabs (metoprolol Tartrate)  .... One twice daily 7)  Skelaxin 800 Mg Tabs (Metaxalone) .... One three times daily as needed 8)  Adult Aspirin Ec Low Strength 81 Mg Tbec (Aspirin) .Marland Kitchen.. 1 once daily 9)  Diltiazem Hcl Er Beads 360 Mg Xr24h-cap (Diltiazem hcl er beads) .... One every morning 10)  Tramadol Hcl 50 Mg Tabs (Tramadol hcl) .... Q6 hrs as needed pain 11)  Hydrocodone-acetaminophen 5-325 Mg Tabs (Hydrocodone-acetaminophen) .... At bedtime as needed pain 12)  Vitamin C 500 Mg Tabs (Ascorbic acid) .... Qd 13)  Omega-3 Krill Oil 300 Mg Caps (Krill oil) .... Qd 14)  Caltrate 600+d 600-400 Mg-unit Tabs (Calcium carbonate-vitamin d) 15)  Multivitamins Tabs (Multiple vitamin) .... Qd 16)  Promethazine Hcl 25 Mg Supp (Promethazine hcl) .Marland Kitchen.. 1 per rectum q6 hrs as needed nausea 17)  Cipro 500 Mg Tabs (Ciprofloxacin hcl) .... Two times a day x7days 18)  Flagyl 500 Mg Tabs (Metronidazole) .... Two times a day x 7days 19)  Percocet 5-325 Mg Tabs (Oxycodone-acetaminophen) .Marland Kitchen.. 1 by mouth q6hrs prn  Patient Instructions: 1)  Avoid foods high in acid (tomatoes, citrus juices, spicy foods). Avoid eating within two hours of lying down or before exercising. Do not  over eat; try smaller more frequent meals. Elevate head of bed twelve inches when sleeping. 2)  DEXILANT ONE DAILY    Orders Added: 1)  Est. Patient Level IV [54098]

## 2010-05-24 NOTE — Letter (Signed)
Summary: [facility]   Imported By: Lester [place] 03/29/2010 11:50:17  _____________________________________________________________________  External Attachment:    Type:   Image     Comment:   External Document

## 2010-07-03 LAB — BASIC METABOLIC PANEL
Chloride: 103 mEq/L (ref 96–112)
GFR calc Af Amer: >60 mL/min (ref >60)
GFR calc non Af Amer: >60 mL/min (ref >60)
Potassium: 3.7 mEq/L (ref 3.5–5.1)
Sodium: 139 mEq/L (ref 135–145)

## 2010-07-03 LAB — DIFFERENTIAL
Basophils Absolute: 0 10*3/uL (ref 0.0–0.1)
Eosinophils Absolute: 0.1 10*3/uL (ref 0.0–0.7)
Eosinophils Relative: 2 % (ref 0–5)
Lymphs Abs: 1.1 10*3/uL (ref 0.7–4.0)
Neutrophils Relative %: 67 % (ref 43–77)

## 2010-07-03 LAB — SURGICAL PCR SCREEN
MRSA, PCR: NEGATIVE
Staphylococcus aureus: POSITIVE — AB

## 2010-07-03 LAB — CBC
HCT: 37.3 % (ref 36.0–46.0)
MCV: 85.5 fL (ref 78.0–100.0)
Platelets: 247 10*3/uL (ref 150–400)
RBC: 4.37 MIL/uL (ref 3.87–5.11)
WBC: 5.2 10*3/uL (ref 4.0–10.5)

## 2010-07-09 LAB — CBC
Hemoglobin: 13.4 g/dL (ref 12.0–15.0)
MCHC: 35.1 g/dL (ref 30.0–36.0)
Platelets: 208 10*3/uL (ref 150–400)
RDW: 13.4 % (ref 11.5–15.5)

## 2010-07-09 LAB — COMPREHENSIVE METABOLIC PANEL
ALT: 15 U/L (ref 0–35)
AST: 25 U/L (ref 0–37)
Albumin: 4 g/dL (ref 3.5–5.2)
Alkaline Phosphatase: 97 U/L (ref 39–117)
Calcium: 8.5 mg/dL (ref 8.4–10.5)
GFR calc Af Amer: >60 mL/min (ref >60)
Potassium: 3.8 mEq/L (ref 3.5–5.1)
Sodium: 138 mEq/L (ref 135–145)
Total Protein: 6.7 g/dL (ref 6.0–8.3)

## 2010-07-09 LAB — DIFFERENTIAL
Basophils Relative: 0 % (ref 0–1)
Eosinophils Absolute: 0.1 10*3/uL (ref 0.0–0.7)
Lymphs Abs: 0.3 10*3/uL — ABNORMAL LOW (ref 0.7–4.0)
Monocytes Absolute: 0.4 10*3/uL (ref 0.1–1.0)
Monocytes Relative: 4 % (ref 3–12)

## 2010-07-09 LAB — POCT CARDIAC MARKERS: Myoglobin, poc: 153 ng/mL (ref 12–200)

## 2010-07-17 ENCOUNTER — Telehealth: Payer: Self-pay | Admitting: Internal Medicine

## 2010-07-18 NOTE — Telephone Encounter (Signed)
I did reach the patient on her work number.  She is having diarrhea multiple times a day. She had a colon resection by Dr Corliss Skains for a cecal polyp and has diarrhea ever since.  I have scheduled her for an appointment with Dr Leone Payor for 07/23/10 10:45

## 2010-07-18 NOTE — Telephone Encounter (Signed)
I returned a call to the above number, but the person that answered says it is a wrong number and that patient doesn't live there.  Will await a return call from the patient

## 2010-07-23 ENCOUNTER — Other Ambulatory Visit: Payer: Self-pay | Admitting: Internal Medicine

## 2010-07-23 ENCOUNTER — Telehealth: Payer: Self-pay

## 2010-07-23 ENCOUNTER — Ambulatory Visit (INDEPENDENT_AMBULATORY_CARE_PROVIDER_SITE_OTHER): Payer: Medicare Other | Admitting: Internal Medicine

## 2010-07-23 ENCOUNTER — Encounter: Payer: Self-pay | Admitting: Internal Medicine

## 2010-07-23 VITALS — BP 140/82 | HR 64 | Ht 63.0 in | Wt 173.4 lb

## 2010-07-23 DIAGNOSIS — Z86010 Personal history of colonic polyps: Secondary | ICD-10-CM

## 2010-07-23 DIAGNOSIS — R195 Other fecal abnormalities: Secondary | ICD-10-CM | POA: Insufficient documentation

## 2010-07-23 MED ORDER — LEVOTHYROXINE SODIUM 200 MCG PO TABS: 200.0000 ug | ORAL_TABLET | Freq: Every day | ORAL | Status: DC

## 2010-07-23 MED ORDER — LOPERAMIDE HCL 2 MG PO TABS: 2.0000 mg | ORAL_TABLET | Freq: Four times a day (QID) | ORAL | Status: DC | PRN

## 2010-07-23 MED ORDER — METOPROLOL SUCCINATE ER 50 MG PO TB24: 50.0000 mg | ORAL_TABLET | Freq: Two times a day (BID) | ORAL | Status: DC

## 2010-07-23 MED ORDER — DILTIAZEM HCL ER COATED BEADS 180 MG PO CP24: 180.0000 mg | ORAL_CAPSULE | Freq: Every day | ORAL | Status: DC

## 2010-07-23 MED ORDER — QUINAPRIL HCL 40 MG PO TABS: 40.0000 mg | ORAL_TABLET | Freq: Two times a day (BID) | ORAL | Status: DC

## 2010-07-23 MED ORDER — SPIRONOLACTONE 25 MG PO TABS: 25.0000 mg | ORAL_TABLET | Freq: Every day | ORAL | Status: DC

## 2010-07-23 NOTE — Telephone Encounter (Signed)
Recall changed  

## 2010-07-23 NOTE — Assessment & Plan Note (Signed)
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 The loss of the ileocecal release this. She is using amount of fiber in her diet and will reduce that, low fiber diet instructions given to be followed but not strictly. She may also use Imodium AD one to 4 a day. If this fails to work she will let me know. We both agree that while salt binder will be reserved for fracture he problems. I don't think she has an infection based upon the overall history so we did not pursue a workup for that.

## 2010-07-23 NOTE — Telephone Encounter (Signed)
Message copied by Darcey Nora on Tue Jul 23, 2010  3:14 PM ------      Message from: Stan Head      Created: Tue Jul 23, 2010  1:36 PM       Need to change her colon recall to 01/2013       It is in for 2016      Thanks - Zella Ball and others do not know how yet

## 2010-07-23 NOTE — Assessment & Plan Note (Signed)
Recall was checked and it is incorrectly entered for October 2016 we will change that entered from October 2014. Next routine colonoscopy then.

## 2010-07-23 NOTE — Patient Instructions (Addendum)
Try the diet listed below. You may not need to follow it strictly as we discussed. May also use Imodium AD (loperamide) on a regular basis up to 4 in a day as needed to control the loose stools occurring after your surgery.   Low-Fiber and Residue Restricted Diets A low-fiber diet restricts foods that contain carbohydrates that are not digested in the small intestine. A diet containing about 10 grams of fiber is considered low-fiber. The diet needs to be individualized to suit patient tolerances and preferences and to avoid unnecessary restrictions. Generally, the foods emphasized in a low-fiber diet have no skins or seeds. They may have been processed to remove bran, germ, or husks. Cooking may not necessarily eliminate the fiber. Cooking may, in fact, enable a greater quantity of fiber to be consumed in a lesser volume. Legumes and nuts are also restricted. The term low-residue has also been used to describe low-fiber diets, although the two are not the same. Residue refers to any substance that adds to bowel (colonic) contents, such as sloughed cells and intestinal germs (bacteria) in addition to fiber. Residue-containing foods, prunes and prune juice, milk, and connective tissue from meats may also need to be eliminated. It is important to eliminate these foods during sudden (acute) attacks of inflammatory bowel disease, when there is a partial obstruction due to another reason, or when minimal fecal output is desired. When problems are in remission, a more normal diet may be used. PURPOSE  Prevent blockage of a partially obstructed or narrowed gastrointestinal tract.   Reduce stool weight and volume.   Slow the movement of waste.  WHEN IS THIS DIET USED?  Acute phase of Crohn's disease, ulcerative colitis, regional enteritis, or diverticulitis.   Narrowing (stenosis) of intestinal or esophageal tubes (lumina).   Transitional diet following surgery, injury (trauma), or illness.   ADEQUACY This diet is nutritionally adequate based on individual food choices according to the Recommended Dietary Allowances of the [employer]. SPECIAL NOTES In severe cases, it is recommended that residue containing foods, prunes and prune juice, milk, and connective tissue from meats be eliminated. Check labels, especially on foods from the starch list. Often, dietary fiber content is listed with the nutrition information. Since products are continually introduced or removed from the grocery shelf, this list may not be complete. FOOD GROUP ALLOWED/RECOMMENDED AVOID/USE SPARINGLY  STARCHES 6 servings or more daily    Breads White, [place], and pita breads, plain rolls, buns, or sweet rolls, doughnuts, waffles, pancakes, bagels. Plain muffins, sweet breads, biscuits, matzoth. Flour. Bread, rolls, or crackers made with whole-wheat, multigrains, rye, bran seeds, nuts, or coconut. Corn tortillas, table-shells.  Crackers Soda, saltine, or graham crackers. Pretzels, rusks, melba toast, zwieback. See above. Corn chips, tortilla chips.  Cereals Cooked cereals: cornmeal, farina, cream cereals. Dry cereals: refined corn, wheat, rice, and oat cereals (check label). Cereals containing whole-grains, multigrains, bran, coconut, nuts, or raisins. Cooked or dry oatmeal. Coarse wheat cereals, granola. Cereals advertised as "high fiber."  Potatoes/Pasta/Rice Potatoes prepared any way without skins, refined macaroni, spaghetti, noodles, refined rice. Potato skins. Whole-grain pasta, wild or brown rice. Popcorn.  VEGETABLES 2 to 3 servings or more daily Strained tomato and vegetable juices. Fresh: tender lettuce, cucumber, cabbage, spinach, bean sprouts. Cooked, canned: asparagus, bean sprouts, cut green or wax beans, cauliflower, pumpkin, beets, mushrooms, olives, spinach, yellow squash, tomato, tomato sauce (no seeds), zucchini (peeled), turnips. Canned sweet potatoes. Small amounts of celery, onion,  radish, and green pepper may be used.  Keep servings limited to 1/2 cup. Fresh, cooked, or canned: artichokes, baked beans, beet greens, broccoli, Brussels sprouts, French-style green beans, corn, kale, legumes, peas, sweet potatoes. Cooked: green or red cabbage, spinach. Avoid large servings of any vegetables.  FRUIT 2 to 3 servings or more daily All fruit juices except prune juice. Cooked or canned: apricots applesauce, cantaloupe, cherries, grapefruit, grapes, kiwi, mandarin oranges, peaches, pears, fruit cocktail, pineapple, plums, watermelon. Fresh: banana, grapes, cantaloupe, avocado, cherries, pineapple, grapefruit, kiwi, nectarines, peaches, oranges, blueberries, plums. Keep servings limited to 1/2 cup or 1 piece. Fresh: apple with or without skin, apricots, mango, pears, raspberries, strawberries. Prune juice, stewed or dried prunes. Dried fruits, raisins, dates. Avoid large servings of all fresh fruits.  MEAT AND MEAT SUBSTITUTES 2 servings or more (4 to 6 total daily) Ground or well-cooked tender beef, ham, veal, lamb, pork, or poultry. Eggs, plain cheese. Fish, oysters, shrimp, lobster, other seafood. Liver, organ meats. Tough, fibrous meats with gristle. Peanut butter, smooth or chunky. Cheese with seeds, nuts, or other foods not allowed. Nuts, seeds, legumes, dried peas, beans, lentils.  MILK 2 cups or equivalent daily All milk products except those not allowed. Milk and milk product consumption should be minimal when low residue is desired. Yogurt that contains nuts or seeds.  SOUPS AND COMBINATION FOODS Bouillon, broth, or cream soups made from allowed foods. Any strained soup. Casseroles or mixed dishes made with allowed foods. Soups made from vegetables that are not allowed or that contain other foods not allowed.  DESSERTS AND SWEETS In moderation Plain cakes and cookies, pie made with allowed fruit, pudding, custard, cream pie. Gelatin, fruit, ice, sherbet, frozen ice pops. Ice cream, ice  milk without nuts. Plain hard candy, honey, jelly, molasses, syrup, sugar, chocolate syrup, gumdrops, marshmallows. Desserts, cookies, or candies that contain nuts, peanut butter, or dried fruits. Jams, preserves with seeds, marmalade.  FATS AND OILS In moderation Margarine, butter, cream, mayonnaise, salad oils, plain salad dressings made from allowed foods. Plain gravy, crisp bacon without rind. Seeds, nuts, olives. Avocados.  BEVERAGES All, except those listed to avoid. Fruit juices with high pulp, prune juice.  CONDIMENTS/ MISCELLANEOUS Ketchup, mustard, horseradish, vinegar, cream sauce, cheese sauce, cocoa powder. Spices in moderation: allspice, basil, bay leaves, celery powder or leaves, cinnamon, cumin powder, curry powder, ginger, mace, marjoram, onion or garlic powder, oregano, paprika, parsley flakes, ground pepper, rosemary, sage, savory, tarragon, thyme, turmeric. Coconut, pickles.  A serving is equal to: 1/2 cup for fruits, vegetables, and cooked cereals or 1 piece for foods such as a piece of bread, 1 orange, or 1 apple. For dry cereals and crackers, use serving sizes listed on the label. SAMPLE MEAL PLAN The following menu is provided as a sample. Your daily menu plans will vary. Be sure to include a minimum of the following each day in order to provide essential nutrients for the adult: Starch/Bread/Cereal Group Fruit/Vegetable Group Meat/Meat Substitute Group Milk/Milk Substitute Group 6 servings 5 servings 2 servings 2 servings  Combination foods may count as full or partial servings from various food groups. Fats, desserts, and sweets may be added to the meal plan after the requirements for essential nutrients are met. SAMPLE MENU Breakfast Lunch Supper  1/2 cup orange juice 1/2 cup chicken noodle soup 3 ounces baked chicken  1 boiled egg 2 to 3 ounces sliced roast beef 1/2 cup scalloped potatoes  1 slice white toast 2 slices seedless rye bread 1/2 cup cooked beets   Margarine Mayonnaise White dinner  roll  3/4 cup cornflakes 1/2 cup tomato juice Margarine  1 cup milk 1 small banana 1/2 cup canned peaches  Beverage Beverage Beverage  Document Released: 09/27/2001 Document Re-Released: 07/02/2009 [facility] [facility] Patient Information 2011 [place], [place]. Ronald Pippins

## 2010-07-23 NOTE — Telephone Encounter (Signed)
Attempted to call pt hm# - VM - lmtcb - need to know what 5 meds she needs rx'd - there are more than 5 rx meds in her history. She can call [facility] and have them request them or she can call back here with the list of meds needed. Called pt at wk - med list given - will send to [facility] . KIK

## 2010-07-23 NOTE — Telephone Encounter (Signed)
Refill her 5 rxs to [facility] in [place].

## 2010-07-23 NOTE — Progress Notes (Signed)
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 Since that time she's had increased frequency of stools that are loose. It was worse originally. She did take some antibiotics briefly, using Cipro and metronidazole in January of this year. However she does not think that affected things. She did have a nausea and vomiting illness at that time and was thought to have diverticulitis by CT scan performed in the emergency department, but her primary care physician appropriately stopped those antibiotics. She may also have a UTI at that time. There has been no fever chills. No more nausea and vomiting since January.  Essentially she has frequent postprandial defecation that is loose. There's been no incontinence described. She has not really tried anything over-the-counter to help this. She used a probiotic originally at discharge from the hospital but stopped that after her surgery because she thought that that was making her go more often. She does not have nocturnal symptoms. She otherwise feels generally well, she has some chronic low back pain that is better after 2 lumbar injections last year as well as time off work. She continues to work at [employer] in [place].  She is due for a complete physical with her primary care provider soon. Her TSH was mildly elevated last year and there are plans to recheck that this year.  Physical exam  Well-developed well-nourished no acute distress Lungs clear Heart S1-S2 no rubs or gallops Abdomen is soft and nontender without organomegaly or mass bowel sounds are present

## 2010-08-13 ENCOUNTER — Ambulatory Visit: Payer: Self-pay | Admitting: Internal Medicine

## 2010-08-21 ENCOUNTER — Encounter: Payer: Self-pay | Admitting: Internal Medicine

## 2010-08-21 ENCOUNTER — Ambulatory Visit (INDEPENDENT_AMBULATORY_CARE_PROVIDER_SITE_OTHER): Payer: Medicare Other | Admitting: Internal Medicine

## 2010-08-21 VITALS — BP 116/72 | Temp 98.5°F | Wt 171.0 lb

## 2010-08-21 DIAGNOSIS — J40 Bronchitis, not specified as acute or chronic: Secondary | ICD-10-CM

## 2010-08-21 DIAGNOSIS — E039 Hypothyroidism, unspecified: Secondary | ICD-10-CM

## 2010-08-21 DIAGNOSIS — I10 Essential (primary) hypertension: Secondary | ICD-10-CM

## 2010-08-21 MED ORDER — HYDROCODONE-HOMATROPINE 5-1.5 MG/5ML PO SYRP: 5.0000 mL | ORAL_SOLUTION | Freq: Four times a day (QID) | ORAL | Status: DC | PRN

## 2010-08-21 MED ORDER — METHYLPREDNISOLONE ACETATE 80 MG/ML IJ SUSP
80.0000 mg | Freq: Once | INTRAMUSCULAR | Status: AC
  Administered 2010-08-21: 80 mg via INTRAMUSCULAR

## 2010-08-21 NOTE — Patient Instructions (Signed)
Get plenty of rest, Drink lots of  clear liquids, and use Tylenol or ibuprofen for fever and discomfort.    Call or return to clinic prn if these symptoms worsen or fail to improve as anticipated.  

## 2010-08-22 ENCOUNTER — Encounter: Payer: Self-pay | Admitting: Internal Medicine

## 2010-08-22 NOTE — Progress Notes (Signed)
  Subjective:    Patient ID: Jasmine Wheeler, female    DOB: [birth year], 65 y.o.   MRN: [id number]  HPI  65 year old patient who presents with a one-week history of largely nonproductive cough she has had no fever chest pain or shortness of breath she has had fatigue weakness and a chief complaint of aggravating cough. She has noted no sputum production but has noted some chest tightness and wheezing she has been sensitive to albuterol in the past she does have a prior history of asthma    Review of Systems  Constitutional: Positive for fatigue.  HENT: Negative for hearing loss, congestion, sore throat, rhinorrhea, dental problem, sinus pressure and tinnitus.   Eyes: Negative for pain, discharge and visual disturbance.  Respiratory: Positive for cough, shortness of breath and wheezing.   Cardiovascular: Negative for chest pain, palpitations and leg swelling.  Gastrointestinal: Negative for nausea, vomiting, abdominal pain, diarrhea, constipation, blood in stool and abdominal distention.  Genitourinary: Negative for dysuria, urgency, frequency, hematuria, flank pain, vaginal bleeding, vaginal discharge, difficulty urinating, vaginal pain and pelvic pain.  Musculoskeletal: Negative for joint swelling, arthralgias and gait problem.  Skin: Negative for rash.  Neurological: Positive for weakness. Negative for dizziness, syncope, speech difficulty, numbness and headaches.  Hematological: Negative for adenopathy.  Psychiatric/Behavioral: Negative for behavioral problems, dysphoric mood and agitation. The patient is not nervous/anxious.        Objective:   Physical Exam  Constitutional: She is oriented to person, place, and time. She appears well-developed and well-nourished. No distress.  HENT:  Head: Normocephalic.  Right Ear: External ear normal.  Left Ear: External ear normal.  Mouth/Throat: Oropharynx is clear and moist.  Eyes: Conjunctivae and EOM are normal. Pupils are equal, round,  and reactive to light.  Neck: Normal range of motion. Neck supple. No thyromegaly present.  Cardiovascular: Normal rate, regular rhythm, normal heart sounds and intact distal pulses.   Pulmonary/Chest: Effort normal. She has wheezes.       No acute distress. O2 saturation 97%  Abdominal: Soft. Bowel sounds are normal. She exhibits no mass. There is no tenderness.  Musculoskeletal: Normal range of motion.  Lymphadenopathy:    She has no cervical adenopathy.  Neurological: She is alert and oriented to person, place, and time.  Skin: Skin is warm and dry. No rash noted.  Psychiatric: She has a normal mood and affect. Her behavior is normal.          Assessment & Plan:   Asthmatic bronchitis. We'll treat with Depo-Medrol expectorants. We'll treat symptomatically with Hydromet. In view of her sensitivity to albuterol will hold inhalational medication at this time. She will call if there is no prompt clinical improvement

## 2010-08-26 ENCOUNTER — Ambulatory Visit: Payer: Self-pay | Admitting: Internal Medicine

## 2010-08-27 ENCOUNTER — Other Ambulatory Visit: Payer: Self-pay

## 2010-08-27 MED ORDER — DILTIAZEM HCL ER COATED BEADS 360 MG PO CP24: 360.0000 mg | ORAL_CAPSULE | Freq: Every day | ORAL | Status: DC

## 2010-08-28 ENCOUNTER — Telehealth: Payer: Self-pay | Admitting: Internal Medicine

## 2010-09-06 NOTE — Assessment & Plan Note (Signed)
[facility] OFFICE NOTE   NAME:FRIDDLEEvaline, Waltman                   MRN:          [id number]  DATE:02/19/2006                            DOB:          [birth year]    A 65 year old female seen today for an annual exam.  She has a history of  multi-drug resistant hypertension, hypothyroidism.  Past history also of  chronic polyps.  She has had a remote hysterectomy in 1994 and C-section on  3 occasions.  She is followed by Dr. Tenny Craw and gynecologic check and  mammogram is scheduled in the near future.  Her last colonoscopy was about  15 months ago.  She had a bone density one year ago.   FAMILY HISTORY:  Father died at 21 of lung cancer.  Mother with a history of  CAD, hypertension and COPD.  One brother status post CABG.  One sister died  at 69 of cervical cancer.   EXAMINATION:  A healthy appearing female in no acute distress.  Blood pressure is 120/80 in the right arm, 130/90 in the left arm.  Weight  stable.  Fundi, ear, nose and throat clear.  NECK:  No bruits or adenopathy.  CHEST:  Was clear.  BREASTS:  Negative.  CARDIOVASCULAR:  Normal heart sounds, no murmur or gallop.  ABDOMEN:  [place].  PELVIC & RECTAL:  Deferred.  EXTREMITIES:  Full peripheral pulses.  NEUROLOGIC:  Negative.   IMPRESSION:  1. Hypertension.  2. Hypothyroidism.   DISPOSITION:  Medical regimen unchanged, prescriptions dispensed.  Reassess  in 4 months.    ______________________________  Gordy Savers, MD    PFK/MedQ  DD: 02/19/2006  DT: 02/19/2006  Job #: [id number]

## 2010-09-20 ENCOUNTER — Other Ambulatory Visit: Payer: Self-pay | Admitting: Obstetrics and Gynecology

## 2010-09-26 ENCOUNTER — Ambulatory Visit (INDEPENDENT_AMBULATORY_CARE_PROVIDER_SITE_OTHER): Payer: Medicare Other | Admitting: Internal Medicine

## 2010-09-26 ENCOUNTER — Encounter: Payer: Self-pay | Admitting: Internal Medicine

## 2010-09-26 DIAGNOSIS — I10 Essential (primary) hypertension: Secondary | ICD-10-CM

## 2010-09-26 DIAGNOSIS — J45909 Unspecified asthma, uncomplicated: Secondary | ICD-10-CM

## 2010-09-26 NOTE — Patient Instructions (Signed)
Limit your sodium (Salt) intake  Please check your blood pressure on a regular basis.  If it is consistently greater than 150/90, please make an office appointment.  Return in 4 months for follow-up  

## 2010-09-26 NOTE — Progress Notes (Signed)
  Subjective:    Patient ID: Jasmine Wheeler, female    DOB: 01-Jan-1946, 65 y.o.   MRN: [id number]  HPI  65 year old patient who is seen today for followup of her hypertension. She was seen recently for asthmatic bronchitis and all the symptoms have resolved. She feels well today. She has had a gynecologic check recently and blood pressure was noted to be normal at that time. She feels well today.  Review of Systems  Constitutional: Negative.   HENT: Negative for hearing loss, congestion, sore throat, rhinorrhea, dental problem, sinus pressure and tinnitus.   Eyes: Negative for pain, discharge and visual disturbance.  Respiratory: Negative for cough and shortness of breath.   Cardiovascular: Negative for chest pain, palpitations and leg swelling.  Gastrointestinal: Negative for nausea, vomiting, abdominal pain, diarrhea, constipation, blood in stool and abdominal distention.  Genitourinary: Negative for dysuria, urgency, frequency, hematuria, flank pain, vaginal bleeding, vaginal discharge, difficulty urinating, vaginal pain and pelvic pain.  Musculoskeletal: Negative for joint swelling, arthralgias and gait problem.  Skin: Negative for rash.  Neurological: Negative for dizziness, syncope, speech difficulty, weakness, numbness and headaches.  Hematological: Negative for adenopathy.  Psychiatric/Behavioral: Negative for behavioral problems, dysphoric mood and agitation. The patient is not nervous/anxious.        Objective:   Physical Exam  Constitutional: She is oriented to person, place, and time. She appears well-developed and well-nourished.  HENT:  Head: Normocephalic.  Right Ear: External ear normal.  Left Ear: External ear normal.  Mouth/Throat: Oropharynx is clear and moist.  Eyes: Conjunctivae and EOM are normal. Pupils are equal, round, and reactive to light.  Neck: Normal range of motion. Neck supple. No thyromegaly present.  Cardiovascular: Normal rate, regular rhythm,  normal heart sounds and intact distal pulses.   Pulmonary/Chest: Effort normal and breath sounds normal.  Abdominal: Soft. Bowel sounds are normal. She exhibits no mass. There is no tenderness.  Musculoskeletal: Normal range of motion.  Lymphadenopathy:    She has no cervical adenopathy.  Neurological: She is alert and oriented to person, place, and time.  Skin: Skin is warm and dry. No rash noted.  Psychiatric: She has a normal mood and affect. Her behavior is normal.          Assessment & Plan:    Hypertension stable Asthmatic bronchitis. Resolved  We'll schedule for a complete exam in the fall

## 2010-11-20 ENCOUNTER — Other Ambulatory Visit: Payer: Self-pay | Admitting: Dermatology

## 2010-11-26 ENCOUNTER — Other Ambulatory Visit: Payer: Self-pay | Admitting: Dermatology

## 2010-12-10 ENCOUNTER — Other Ambulatory Visit: Payer: Self-pay | Admitting: Dermatology

## 2011-01-10 ENCOUNTER — Other Ambulatory Visit: Payer: Self-pay | Admitting: Dermatology

## 2011-01-15 ENCOUNTER — Other Ambulatory Visit (INDEPENDENT_AMBULATORY_CARE_PROVIDER_SITE_OTHER): Payer: Medicare Other

## 2011-01-15 DIAGNOSIS — E039 Hypothyroidism, unspecified: Secondary | ICD-10-CM

## 2011-01-15 DIAGNOSIS — Z0000 Encounter for general adult medical examination without abnormal findings: Secondary | ICD-10-CM

## 2011-01-15 LAB — LIPID PANEL
Cholesterol: 189 mg/dL (ref 0–200)
Triglycerides: 144 mg/dL (ref 0.0–149.0)

## 2011-01-15 LAB — BASIC METABOLIC PANEL
BUN: 23 mg/dL (ref 6–23)
Chloride: 102 mEq/L (ref 96–112)
GFR: 64.59 mL/min (ref >60.00)
Glucose, Bld: 100 mg/dL — ABNORMAL HIGH (ref 70–99)
Potassium: 4.3 mEq/L (ref 3.5–5.1)
Sodium: 140 mEq/L (ref 135–145)

## 2011-01-15 LAB — CBC WITH DIFFERENTIAL/PLATELET
Eosinophils Relative: 2.2 % (ref 0.0–5.0)
HCT: 35.4 % — ABNORMAL LOW (ref 36.0–46.0)
Hemoglobin: 11.9 g/dL — ABNORMAL LOW (ref 12.0–15.0)
Lymphs Abs: 1.3 10*3/uL (ref 0.7–4.0)
MCV: 86.7 fl (ref 78.0–100.0)
Monocytes Absolute: 0.3 10*3/uL (ref 0.1–1.0)
Monocytes Relative: 6.8 % (ref 3.0–12.0)
Neutro Abs: 3.2 10*3/uL (ref 1.4–7.7)
Platelets: 230 10*3/uL (ref 150.0–400.0)
RDW: 13.1 % (ref 11.5–14.6)
WBC: 4.9 10*3/uL (ref 4.5–10.5)

## 2011-01-15 LAB — POCT URINALYSIS DIPSTICK
Bilirubin, UA: NEGATIVE
Blood, UA: NEGATIVE
Glucose, UA: NEGATIVE
Ketones, UA: NEGATIVE
Spec Grav, UA: 1.02

## 2011-01-15 LAB — TSH: TSH: 0.49 u[IU]/mL (ref 0.35–5.50)

## 2011-01-15 LAB — HEPATIC FUNCTION PANEL
ALT: 19 U/L (ref 0–35)
AST: 18 U/L (ref 0–37)
Albumin: 3.8 g/dL (ref 3.5–5.2)

## 2011-01-29 ENCOUNTER — Ambulatory Visit (INDEPENDENT_AMBULATORY_CARE_PROVIDER_SITE_OTHER): Payer: Medicare Other | Admitting: Internal Medicine

## 2011-01-29 ENCOUNTER — Encounter: Payer: Self-pay | Admitting: Internal Medicine

## 2011-01-29 DIAGNOSIS — M545 Low back pain: Secondary | ICD-10-CM

## 2011-01-29 DIAGNOSIS — Z23 Encounter for immunization: Secondary | ICD-10-CM

## 2011-01-29 DIAGNOSIS — J45909 Unspecified asthma, uncomplicated: Secondary | ICD-10-CM

## 2011-01-29 DIAGNOSIS — Z0000 Encounter for general adult medical examination without abnormal findings: Secondary | ICD-10-CM

## 2011-01-29 DIAGNOSIS — I10 Essential (primary) hypertension: Secondary | ICD-10-CM

## 2011-01-29 DIAGNOSIS — E039 Hypothyroidism, unspecified: Secondary | ICD-10-CM

## 2011-01-29 NOTE — Progress Notes (Signed)
Subjective:    Patient ID: Jasmine Wheeler, female    DOB: [birth year], 65 y.o.   MRN: [id number]  HPI   History of Present Illness:   65 year-old patient who is seen today for a comprehensive evaluation. Medical constant with a multi-drug-resistant hypertension. She has hypothyroidism. History colonic polyps and a history of mild asthma. For the past, week she's had increasing chest congestion, cough, and some wheezing. She is followed closely by gynecology.   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  Family History Lung cancer  Family History Other cancer-Cervical  Fam hx CAD  Fam hx COPD  Family History Diabetes 1st degree relative  father died at age 43, lung cancer.  Mother history coronary disease, hypertension, COPD, hypothyroidism, CAD; history of rectal cancer  one brother one sister. Positive for CAD s/p CABG and cervical cancer (Sister died age 87)  MGM-CNS Ca  2 maternal uncles-colon ca   Social History:  Reviewed history from 05/05/2008 and no changes required.  Married  works at [employer] Status: never   1. Risk factors, based on past  M,S,F history- cardiovascular risk factors include hypertension 2.  Physical activities: Remains quite active still is employed full-time no exercise limitations  3.  Depression/mood: No history depression or mood disorder  4.  Hearing: No deficits  5.  ADL's: Independent in all aspect of daily living  6.  Fall risk:  Low  7.  Home safety: No problems identified  8.  Height weight, and visual acuity; height and weight stable no change in visual acuity  9.  Counseling: Heart healthy diet encouraged restricted salt diet encouraged regular exercise encouraged  10. Lab orders based on risk factors: Laboratory profile including TSH and lipid panel reviewed  11. Referral : Will follow up with gynecology in December will have a mammogram and bone density at that time  12. Care plan: We'll regular exercise and modest weight loss encouraged  13. Cognitive assessment: Alert and oriented normal affect no cognitive dysfunction       Review of Systems  Constitutional: Negative for fever, appetite change, fatigue and unexpected weight change.  HENT: Negative for hearing loss, ear pain, nosebleeds, congestion, sore throat, mouth sores, trouble swallowing, neck stiffness, dental problem, voice change, sinus pressure and tinnitus.   Eyes: Negative for photophobia, pain, redness and visual disturbance.  Respiratory: Negative for cough, chest tightness and shortness of breath.   Cardiovascular: Negative for chest pain, palpitations and leg swelling.  Gastrointestinal: Negative for nausea, vomiting, abdominal pain, diarrhea, constipation, blood in stool, abdominal distention and rectal pain.  Genitourinary: Negative for dysuria, urgency, frequency, hematuria, flank pain, vaginal bleeding, vaginal discharge, difficulty urinating, genital sores, vaginal pain, menstrual problem and pelvic pain.  Musculoskeletal: Negative for back pain and arthralgias.  Skin: Negative for rash.  Neurological: Negative for dizziness, syncope, speech difficulty, weakness, light-headedness, numbness and headaches.  Hematological: Negative for adenopathy. Does not bruise/bleed easily.  Psychiatric/Behavioral: Negative for suicidal ideas, behavioral problems, self-injury, dysphoric mood and agitation. The patient is not nervous/anxious.  Objective:   Physical Exam  Constitutional: She is oriented to person, place, and time. She appears well-developed and well-nourished.  HENT:  Head: Normocephalic and atraumatic.  Right Ear: External ear normal.  Left Ear: External ear normal.  Mouth/Throat: Oropharynx is clear and moist.  Eyes: Conjunctivae and EOM are normal.  Neck: Normal range of motion. Neck supple. No JVD present. No thyromegaly present.  Cardiovascular: Normal rate, regular rhythm, normal heart sounds and intact distal pulses.   No murmur heard. Pulmonary/Chest: Effort normal and breath sounds normal. She has no wheezes. She has no rales.  Abdominal: Soft. Bowel sounds are normal. She exhibits no distension and no mass. There is no tenderness. There is no rebound and no guarding.       Lower midline surgical scar  Musculoskeletal: Normal range of motion. She exhibits no edema and no tenderness.  Neurological: She is alert and oriented to person, place, and time. She has normal reflexes. No cranial nerve deficit. She exhibits normal muscle tone. Coordination normal.  Skin: Skin is warm and dry. No rash noted.       Surgical scar left upper arm  Psychiatric: She has a normal mood and affect. Her behavior is normal.          Assessment & Plan:   Preventive health examination Hypertension. Blood pressure is slightly elevated today but home blood pressure readings have been well controlled in general she has had nice blood pressure readings and office tendons as well. We'll continue her present regimen so restricted diet modest weight loss more regular exercise all encouraged Hypothyroidism normal TSH we'll continue present dose of Synthroid Colonic polyps followup colonoscopy in 2014 Recent diagnosis of melanoma. Close followup dermatology

## 2011-01-29 NOTE — Patient Instructions (Signed)
Limit your sodium (Salt) intake    It is important that you exercise regularly, at least 20 minutes 3 to 4 times per week.  If you develop chest pain or shortness of breath seek  medical attention.  Followup gynecology and dermatology  Take a calcium supplement, plus [phone] units of vitamin D  Return in 6 months for follow-up

## 2011-02-20 ENCOUNTER — Other Ambulatory Visit (HOSPITAL_COMMUNITY): Payer: Self-pay | Admitting: Obstetrics and Gynecology

## 2011-02-20 DIAGNOSIS — M949 Disorder of cartilage, unspecified: Secondary | ICD-10-CM

## 2011-02-20 DIAGNOSIS — Z1231 Encounter for screening mammogram for malignant neoplasm of breast: Secondary | ICD-10-CM

## 2011-03-18 NOTE — Telephone Encounter (Signed)
Chart opened in error

## 2011-03-26 ENCOUNTER — Ambulatory Visit (HOSPITAL_COMMUNITY)
Admission: RE | Admit: 2011-03-26 | Discharge: 2011-03-26 | Disposition: A | Discharge status: Home / Self Care | Payer: Medicare Other | Source: Ambulatory Visit | Attending: Obstetrics and Gynecology | Admitting: Obstetrics and Gynecology

## 2011-03-26 DIAGNOSIS — Z1231 Encounter for screening mammogram for malignant neoplasm of breast: Secondary | ICD-10-CM | POA: Insufficient documentation

## 2011-03-26 DIAGNOSIS — M899 Disorder of bone, unspecified: Secondary | ICD-10-CM

## 2011-04-04 ENCOUNTER — Encounter: Payer: Self-pay | Admitting: Internal Medicine

## 2011-04-04 ENCOUNTER — Ambulatory Visit (INDEPENDENT_AMBULATORY_CARE_PROVIDER_SITE_OTHER): Payer: Medicare Other | Admitting: Internal Medicine

## 2011-04-04 DIAGNOSIS — I10 Essential (primary) hypertension: Secondary | ICD-10-CM

## 2011-04-04 DIAGNOSIS — J45909 Unspecified asthma, uncomplicated: Secondary | ICD-10-CM

## 2011-04-04 DIAGNOSIS — E039 Hypothyroidism, unspecified: Secondary | ICD-10-CM

## 2011-04-04 MED ORDER — HYDROCODONE-HOMATROPINE 5-1.5 MG/5ML PO SYRP: 5.0000 mL | ORAL_SOLUTION | Freq: Four times a day (QID) | ORAL | Status: AC | PRN

## 2011-04-04 NOTE — Progress Notes (Signed)
  Subjective:    Patient ID: Jasmine Wheeler, female    DOB: June 17, 1945, 65 y.o.   MRN: [id number]  HPI 65 year old patient has a history of hypertension hypothyroidism and asthma. She presents with a two-day history of sinus congestion drainage sore throat. She has had some headaches in the retro-orbital area her hypertension has been nicely controlled;  there's been no wheezing fever chills or productive cough    Review of Systems  Constitutional: Negative.   HENT: Positive for congestion, sore throat, rhinorrhea and sinus pressure. Negative for hearing loss, dental problem and tinnitus.   Eyes: Negative for pain, discharge and visual disturbance.  Respiratory: Negative for cough and shortness of breath.   Cardiovascular: Negative for chest pain, palpitations and leg swelling.  Gastrointestinal: Negative for nausea, vomiting, abdominal pain, diarrhea, constipation, blood in stool and abdominal distention.  Genitourinary: Negative for dysuria, urgency, frequency, hematuria, flank pain, vaginal bleeding, vaginal discharge, difficulty urinating, vaginal pain and pelvic pain.  Musculoskeletal: Negative for joint swelling, arthralgias and gait problem.  Skin: Negative for rash.  Neurological: Positive for headaches. Negative for dizziness, syncope, speech difficulty, weakness and numbness.  Hematological: Negative for adenopathy.  Psychiatric/Behavioral: Negative for behavioral problems, dysphoric mood and agitation. The patient is not nervous/anxious.        Objective:   Physical Exam  Constitutional: She is oriented to person, place, and time. She appears well-developed and well-nourished.       Blood pressure 110/70  HENT:  Head: Normocephalic.  Right Ear: External ear normal.  Left Ear: External ear normal.  Mouth/Throat: Oropharynx is clear and moist.  Eyes: Conjunctivae and EOM are normal. Pupils are equal, round, and reactive to light.  Neck: Normal range of motion. Neck  supple. No thyromegaly present.  Cardiovascular: Normal rate, regular rhythm, normal heart sounds and intact distal pulses.   Pulmonary/Chest: Effort normal and breath sounds normal.  Abdominal: Soft. Bowel sounds are normal. She exhibits no mass. There is no tenderness.  Musculoskeletal: Normal range of motion.  Lymphadenopathy:    She has no cervical adenopathy.  Neurological: She is alert and oriented to person, place, and time.  Skin: Skin is warm and dry. No rash noted.  Psychiatric: She has a normal mood and affect. Her behavior is normal.          Assessment & Plan:   Viral URI. Will treat symptomatically. Hypertension stable

## 2011-04-04 NOTE — Patient Instructions (Signed)
Get plenty of rest, Drink lots of  clear liquids, and use Tylenol or ibuprofen for fever and discomfort.    Return in 4 months for follow-up  

## 2011-04-09 ENCOUNTER — Telehealth: Payer: Self-pay | Admitting: *Deleted

## 2011-04-09 MED ORDER — HYDROCODONE-HOMATROPINE 5-1.5 MG/5ML PO SYRP: 5.0000 mL | ORAL_SOLUTION | Freq: Four times a day (QID) | ORAL | Status: AC | PRN

## 2011-04-09 NOTE — Telephone Encounter (Signed)
Hydromet 6 ounces 1 teaspoon  every 6 hours as needed for cough and congestion 

## 2011-04-09 NOTE — Telephone Encounter (Signed)
Pt is coughing up green stuff and wants something called to help get rid of this congestion.

## 2011-04-09 NOTE — Telephone Encounter (Signed)
rx called into pharmacy

## 2011-04-11 ENCOUNTER — Other Ambulatory Visit: Payer: Self-pay | Admitting: Dermatology

## 2011-05-17 ENCOUNTER — Other Ambulatory Visit: Payer: Self-pay | Admitting: Internal Medicine

## 2011-05-26 ENCOUNTER — Telehealth: Payer: Self-pay | Admitting: *Deleted

## 2011-05-26 NOTE — Telephone Encounter (Signed)
LM for pt to call in the am for appt.

## 2011-05-26 NOTE — Telephone Encounter (Signed)
Pt states she has a knot and a bruise on the bottom of her foot that aches.  Should she be concerned?  Should she simple keep an eye on it or should she come in to see Dr. Kirtland Bouchard?

## 2011-05-27 ENCOUNTER — Encounter: Payer: Self-pay | Admitting: Internal Medicine

## 2011-05-27 ENCOUNTER — Ambulatory Visit (INDEPENDENT_AMBULATORY_CARE_PROVIDER_SITE_OTHER): Payer: Medicare Other | Admitting: Internal Medicine

## 2011-05-27 DIAGNOSIS — M6740 Ganglion, unspecified site: Secondary | ICD-10-CM

## 2011-05-27 DIAGNOSIS — I10 Essential (primary) hypertension: Secondary | ICD-10-CM

## 2011-05-27 NOTE — Patient Instructions (Signed)
Call or return to clinic prn if these symptoms worsen or fail to improve as anticipated.

## 2011-05-27 NOTE — Progress Notes (Signed)
  Subjective:    Patient ID: Jasmine Wheeler, female    DOB: [birth year], 66 y.o.   MRN: [id number]  HPI  66 year old patient has a history of treated hypertension. The past 3 days she has noted slightly tender nodule involving the plantar aspect of her right midfoot area. There's been no trauma. Pain is minor.    Review of Systems  Musculoskeletal:       Right foot pain       Objective:   Physical Exam  Musculoskeletal:       A slightly tender 1 cm nodule present involving the right mid plantar aspect of the foot          Assessment & Plan:   Right foot nodule mid plantar aspect. Appears to be a benign cyst possibly a ganglion cyst pain is very minor will simply observe at this point and take Tylenol or Advil as necessary. If this fails to resolve or worsens will recontact the office and we'll consider referral to a podiatrist

## 2011-07-10 ENCOUNTER — Other Ambulatory Visit: Payer: Self-pay

## 2011-07-26 ENCOUNTER — Other Ambulatory Visit: Payer: Self-pay | Admitting: Internal Medicine

## 2011-07-30 ENCOUNTER — Ambulatory Visit: Payer: Medicare Other | Admitting: Internal Medicine

## 2011-08-07 ENCOUNTER — Other Ambulatory Visit: Payer: Self-pay

## 2011-08-08 ENCOUNTER — Ambulatory Visit (INDEPENDENT_AMBULATORY_CARE_PROVIDER_SITE_OTHER): Payer: Medicare Other | Admitting: Internal Medicine

## 2011-08-08 ENCOUNTER — Encounter: Payer: Self-pay | Admitting: Internal Medicine

## 2011-08-08 VITALS — BP 140/80 | Temp 98.1°F | Wt 178.0 lb

## 2011-08-08 DIAGNOSIS — E039 Hypothyroidism, unspecified: Secondary | ICD-10-CM

## 2011-08-08 DIAGNOSIS — J45909 Unspecified asthma, uncomplicated: Secondary | ICD-10-CM

## 2011-08-08 DIAGNOSIS — I10 Essential (primary) hypertension: Secondary | ICD-10-CM

## 2011-08-08 NOTE — Patient Instructions (Signed)
Limit your sodium (Salt) intake    It is important that you exercise regularly, at least 20 minutes 3 to 4 times per week.  If you develop chest pain or shortness of breath seek  medical attention.  Please check your blood pressure on a regular basis.  If it is consistently greater than 150/90, please make an office appointment.  Return in 6 months for follow-up   

## 2011-08-08 NOTE — Progress Notes (Signed)
  Subjective:    Patient ID: Jasmine Wheeler, female    DOB: 01-30-1946, 66 y.o.   MRN: [id number]  HPI  66 year old patient who is in today for followup of hypertension. She is doing quite well without concerns or complaints. She does have a history of asthma which has been stable. She has hypothyroidism on Synthroid supplementation. No concerns or complaints    Review of Systems  Constitutional: Negative.   HENT: Negative for hearing loss, congestion, sore throat, rhinorrhea, dental problem, sinus pressure and tinnitus.   Eyes: Negative for pain, discharge and visual disturbance.  Respiratory: Negative for cough and shortness of breath.   Cardiovascular: Negative for chest pain, palpitations and leg swelling.  Gastrointestinal: Negative for nausea, vomiting, abdominal pain, diarrhea, constipation, blood in stool and abdominal distention.  Genitourinary: Negative for dysuria, urgency, frequency, hematuria, flank pain, vaginal bleeding, vaginal discharge, difficulty urinating, vaginal pain and pelvic pain.  Musculoskeletal: Negative for joint swelling, arthralgias and gait problem.  Skin: Negative for rash. Wound: recent resection of a dysplastic nevus left upper back.  Neurological: Negative for dizziness, syncope, speech difficulty, weakness, numbness and headaches.  Hematological: Negative for adenopathy.  Psychiatric/Behavioral: Negative for behavioral problems, dysphoric mood and agitation. The patient is not nervous/anxious.        Objective:   Physical Exam  Constitutional: She is oriented to person, place, and time. She appears well-developed and well-nourished.       Repeat blood pressure 122/80  HENT:  Head: Normocephalic.  Right Ear: External ear normal.  Left Ear: External ear normal.  Mouth/Throat: Oropharynx is clear and moist.  Eyes: Conjunctivae and EOM are normal. Pupils are equal, round, and reactive to light.  Neck: Normal range of motion. Neck supple. No  thyromegaly present.  Cardiovascular: Normal rate, regular rhythm, normal heart sounds and intact distal pulses.   Pulmonary/Chest: Effort normal and breath sounds normal.  Abdominal: Soft. Bowel sounds are normal. She exhibits no mass. There is no tenderness.  Musculoskeletal: Normal range of motion.  Lymphadenopathy:    She has no cervical adenopathy.  Neurological: She is alert and oriented to person, place, and time.  Skin: Skin is warm and dry. No rash noted.  Psychiatric: She has a normal mood and affect. Her behavior is normal.          Assessment & Plan:   Hypertension well controlled Hypothyroidism Asthma stable  We'll continue a low-salt diet and home blood pressure monitoring. More exercise modest weight loss all encouraged. We'll see in 6 months for her annual exam. Medications refilled

## 2011-08-13 ENCOUNTER — Other Ambulatory Visit: Payer: Self-pay | Admitting: Internal Medicine

## 2011-08-31 ENCOUNTER — Other Ambulatory Visit: Payer: Self-pay | Admitting: Internal Medicine

## 2011-10-04 ENCOUNTER — Other Ambulatory Visit: Payer: Self-pay | Admitting: Internal Medicine

## 2011-11-04 ENCOUNTER — Other Ambulatory Visit: Payer: Self-pay | Admitting: Internal Medicine

## 2012-02-02 ENCOUNTER — Other Ambulatory Visit (INDEPENDENT_AMBULATORY_CARE_PROVIDER_SITE_OTHER): Payer: Medicare Other

## 2012-02-02 DIAGNOSIS — Z0000 Encounter for general adult medical examination without abnormal findings: Secondary | ICD-10-CM

## 2012-02-02 DIAGNOSIS — I10 Essential (primary) hypertension: Secondary | ICD-10-CM

## 2012-02-02 DIAGNOSIS — Z79899 Other long term (current) drug therapy: Secondary | ICD-10-CM

## 2012-02-02 LAB — CBC WITH DIFFERENTIAL/PLATELET
Basophils Absolute: 0 10*3/uL (ref 0.0–0.1)
Eosinophils Absolute: 0.1 10*3/uL (ref 0.0–0.7)
HCT: 34 % — ABNORMAL LOW (ref 36.0–46.0)
Lymphs Abs: 1.1 10*3/uL (ref 0.7–4.0)
MCHC: 32.6 g/dL (ref 30.0–36.0)
MCV: 90.1 fl (ref 78.0–100.0)
Monocytes Absolute: 0.5 10*3/uL (ref 0.1–1.0)
Neutrophils Relative %: 73.1 % (ref 43.0–77.0)
Platelets: 197 10*3/uL (ref 150.0–400.0)
RDW: 13.8 % (ref 11.5–14.6)
WBC: 6.3 10*3/uL (ref 4.5–10.5)

## 2012-02-02 LAB — POCT URINALYSIS DIPSTICK
Blood, UA: NEGATIVE
Glucose, UA: NEGATIVE
Nitrite, UA: NEGATIVE
Protein, UA: NEGATIVE
Spec Grav, UA: 1.02
Urobilinogen, UA: 0.2
pH, UA: 6

## 2012-02-02 LAB — BASIC METABOLIC PANEL
BUN: 23 mg/dL (ref 6–23)
CO2: 28 mEq/L (ref 19–32)
Chloride: 103 mEq/L (ref 96–112)
Creatinine, Ser: 0.9 mg/dL (ref 0.4–1.2)
Glucose, Bld: 115 mg/dL — ABNORMAL HIGH (ref 70–99)
Potassium: 3.8 mEq/L (ref 3.5–5.1)

## 2012-02-02 LAB — HEPATIC FUNCTION PANEL
ALT: 13 U/L (ref 0–35)
AST: 11 U/L (ref 0–37)
Albumin: 3.4 g/dL — ABNORMAL LOW (ref 3.5–5.2)
Alkaline Phosphatase: 82 U/L (ref 39–117)
Total Protein: 6.1 g/dL (ref 6.0–8.3)

## 2012-02-02 LAB — LIPID PANEL
LDL Cholesterol: 95 mg/dL (ref 0–99)
Total CHOL/HDL Ratio: 4

## 2012-02-02 LAB — TSH: TSH: 0.15 u[IU]/mL — ABNORMAL LOW (ref 0.35–5.50)

## 2012-02-09 ENCOUNTER — Ambulatory Visit (INDEPENDENT_AMBULATORY_CARE_PROVIDER_SITE_OTHER): Payer: Medicare Other | Admitting: Internal Medicine

## 2012-02-09 ENCOUNTER — Encounter: Payer: Self-pay | Admitting: Internal Medicine

## 2012-02-09 VITALS — BP 120/82 | HR 60 | Temp 98.2°F | Resp 20 | Ht 62.25 in | Wt 172.0 lb

## 2012-02-09 DIAGNOSIS — E785 Hyperlipidemia, unspecified: Secondary | ICD-10-CM

## 2012-02-09 DIAGNOSIS — R7302 Impaired glucose tolerance (oral): Secondary | ICD-10-CM | POA: Insufficient documentation

## 2012-02-09 DIAGNOSIS — R7309 Other abnormal glucose: Secondary | ICD-10-CM

## 2012-02-09 DIAGNOSIS — I10 Essential (primary) hypertension: Secondary | ICD-10-CM

## 2012-02-09 DIAGNOSIS — Z0000 Encounter for general adult medical examination without abnormal findings: Secondary | ICD-10-CM

## 2012-02-09 DIAGNOSIS — Z860100 Personal history of colon polyps, unspecified: Secondary | ICD-10-CM

## 2012-02-09 DIAGNOSIS — E039 Hypothyroidism, unspecified: Secondary | ICD-10-CM

## 2012-02-09 MED ORDER — QUINAPRIL HCL 40 MG PO TABS: 40.0000 mg | ORAL_TABLET | Freq: Every day | ORAL | Status: DC

## 2012-02-09 MED ORDER — LEVOTHYROXINE SODIUM 200 MCG PO TABS: 200.0000 ug | ORAL_TABLET | Freq: Every day | ORAL | Status: DC

## 2012-02-09 MED ORDER — DILTIAZEM HCL ER BEADS 360 MG PO CP24: 360.0000 mg | ORAL_CAPSULE | Freq: Every day | ORAL | Status: DC

## 2012-02-09 MED ORDER — METOPROLOL SUCCINATE ER 50 MG PO TB24: 50.0000 mg | ORAL_TABLET | Freq: Every day | ORAL | Status: DC

## 2012-02-09 MED ORDER — SPIRONOLACTONE 25 MG PO TABS: 25.0000 mg | ORAL_TABLET | Freq: Every day | ORAL | Status: DC

## 2012-02-09 NOTE — Patient Instructions (Addendum)

## 2012-02-09 NOTE — Progress Notes (Signed)
Patient ID: Jasmine Wheeler, female   DOB: March 02, 1946, 66 y.o.   MRN: [id number]  Subjective:    Patient ID: Jasmine Wheeler, female    DOB: 1945/07/22, 66 y.o.   MRN: [id number]  HPI   History of Present Illness:   66  year-old patient who is seen today for a comprehensive evaluation. Medical problems include multi-drug-resistant hypertension. She has hypothyroidism. History colonic polyps and a history of mild asthma. She is followed closely by dermatology and is scheduled for followup colonoscopy next year She is followed closely by gynecology.   Preventive Screening-Counseling & Management  Alcohol-Tobacco  Smoking Status: never   Allergies:  1) Uniphyl  2) Adalat Cc (Nifedipine)  3) Proventil   Past History:  Past Medical History:  Hypertension  Hypothyroidism  Colonic polyps, hx of tubulovillous adenoma Asthma History of melanoma   Past Surgical History:  Hysterectomy complete  Caesarean section  Colonoscopy-12/06/2004  bone density 2006 2008, 02-2009  Resection left arm melanoma October 2012 Resection tubovillous adenoma October 2011  Family History:  Reviewed history from 05/05/2008 and no changes required.  Family History Lung cancer  Family History Other cancer-Cervical  Fam hx CAD  Fam hx COPD  Family History Diabetes 1st degree relative  father died at age 35, lung cancer.  Mother history coronary disease, hypertension, COPD, hypothyroidism, CAD; history of rectal cancer  one brother one sister. Positive for CAD s/p CABG and cervical cancer (Sister died age 36)  MGM-CNS Ca  2 maternal uncles-colon ca   Social History:  Reviewed history from 05/05/2008 and no changes required.  Married  works at [employer] Status: never   1. Risk factors, based on past  M,S,F history- cardiovascular risk factors include hypertension 2.  Physical activities: Remains quite active still is employed full-time no exercise limitations  3.  Depression/mood: No  history depression or mood disorder  4.  Hearing: No deficits  5.  ADL's: Independent in all aspect of daily living  6.  Fall risk: Low  7.  Home safety: No problems identified  8.  Height weight, and visual acuity; height and weight stable no change in visual acuity  9.  Counseling: Heart healthy diet encouraged restricted salt diet encouraged regular exercise encouraged  10. Lab orders based on risk factors: Laboratory profile including TSH and lipid panel reviewed  11. Referral : Will follow up with gynecology in December will have a mammogram and bone density at that time  12. Care plan: We'll regular exercise and modest weight loss encouraged  13. Cognitive assessment: Alert and oriented normal affect no cognitive dysfunction       Review of Systems  Constitutional: Negative for fever, appetite change, fatigue and unexpected weight change.  HENT: Negative for hearing loss, ear pain, nosebleeds, congestion, sore throat, mouth sores, trouble swallowing, neck stiffness, dental problem, voice change, sinus pressure and tinnitus.   Eyes: Negative for photophobia, pain, redness and visual disturbance.  Respiratory: Negative for cough, chest tightness and shortness of breath.   Cardiovascular: Negative for chest pain, palpitations and leg swelling.  Gastrointestinal: Negative for nausea, vomiting, abdominal pain, diarrhea, constipation, blood in stool, abdominal distention and rectal pain.  Genitourinary: Negative for dysuria, urgency, frequency, hematuria, flank pain, vaginal bleeding, vaginal discharge, difficulty urinating, genital sores, vaginal pain, menstrual problem and pelvic pain.  Musculoskeletal: Negative for back pain and arthralgias.  Skin: Negative for rash.  Neurological: Negative for dizziness, syncope, speech difficulty, weakness, light-headedness, numbness and headaches.  Hematological: Negative for adenopathy.  Does not bruise/bleed easily.    Psychiatric/Behavioral: Negative for suicidal ideas, behavioral problems, self-injury, dysphoric mood and agitation. The patient is not nervous/anxious.        Objective:   Physical Exam  Constitutional: She is oriented to person, place, and time. She appears well-developed and well-nourished.  HENT:  Head: Normocephalic and atraumatic.  Right Ear: External ear normal.  Left Ear: External ear normal.  Mouth/Throat: Oropharynx is clear and moist.  Eyes: Conjunctivae normal and EOM are normal.  Neck: Normal range of motion. Neck supple. No JVD present. No thyromegaly present.  Cardiovascular: Normal rate, regular rhythm, normal heart sounds and intact distal pulses.   No murmur heard. Pulmonary/Chest: Effort normal and breath sounds normal. She has no wheezes. She has no rales.  Abdominal: Soft. Bowel sounds are normal. She exhibits no distension and no mass. There is no tenderness. There is no rebound and no guarding.       Lower midline surgical scar  Musculoskeletal: Normal range of motion. She exhibits no edema and no tenderness.  Neurological: She is alert and oriented to person, place, and time. She has normal reflexes. No cranial nerve deficit. She exhibits normal muscle tone. Coordination normal.  Skin: Skin is warm and dry. No rash noted.       Surgical scar left upper arm  Psychiatric: She has a normal mood and affect. Her behavior is normal.          Assessment & Plan:   Preventive health examination Hypertension. blood pressure readings have been well controlled in general. We'll continue her present regimen; salt  restricted diet modest weight loss more regular exercise all encouraged Patient is presently following a Weight Watchers diet with some nice results Impaired glucose tolerance Hypothyroidism slightly suppressed TSH;  we'll continue present dose of Synthroid Colonic polyps followup colonoscopy in 2014 Recent diagnosis of melanoma. Close followup  dermatology

## 2012-02-12 ENCOUNTER — Telehealth: Payer: Self-pay | Admitting: Internal Medicine

## 2012-02-12 MED ORDER — METOPROLOL SUCCINATE ER 50 MG PO TB24: 50.0000 mg | ORAL_TABLET | Freq: Two times a day (BID) | ORAL | Status: DC

## 2012-02-12 MED ORDER — QUINAPRIL HCL 40 MG PO TABS: 40.0000 mg | ORAL_TABLET | Freq: Two times a day (BID) | ORAL | Status: DC

## 2012-02-12 NOTE — Telephone Encounter (Signed)
Spoke with pt- informed I have read note from cpx- no changes to meds indicated - will send new rx's to pharmacy .

## 2012-02-12 NOTE — Telephone Encounter (Signed)
Caller: [name]/Patient; Patient Name: Jasmine Wheeler; PCP: Eleonore Chiquito [facility]); Best Callback Phone Number: [phone].  Pt. had refills this week for the Metoprolol Succinate (Tablet SR 24 hr) TOPROL-XL 50 MG , and the Quinapril HCl (Tab) ACCUPRIL 40 MG.  Pt states her previous dosing schedule was to take these 2 meds BID.  When pt. picked up her refills, she notes it is now scheduled for QD.  Pt. states this is a different dose schedule that she had been taking.  Pt. states that Dr. Lesia Hausen states that he would keep her on the same dosing, and a decrease in dosing was never discussed.   Pt. inquiring whether this is the correct dosing.  PLEASE ADVISE.  CAN/db.

## 2012-02-16 ENCOUNTER — Other Ambulatory Visit: Payer: Self-pay | Admitting: *Deleted

## 2012-03-09 ENCOUNTER — Other Ambulatory Visit: Payer: Self-pay | Admitting: Internal Medicine

## 2012-03-09 DIAGNOSIS — Z1231 Encounter for screening mammogram for malignant neoplasm of breast: Secondary | ICD-10-CM

## 2012-04-21 HISTORY — PX: HEMORRHOID BANDING: SHX5850

## 2012-06-09 ENCOUNTER — Ambulatory Visit (HOSPITAL_COMMUNITY)
Admission: RE | Admit: 2012-06-09 | Discharge: 2012-06-09 | Disposition: A | Discharge status: Home / Self Care | Payer: Medicare Other | Source: Ambulatory Visit | Attending: Internal Medicine | Admitting: Internal Medicine

## 2012-06-09 DIAGNOSIS — Z1231 Encounter for screening mammogram for malignant neoplasm of breast: Secondary | ICD-10-CM | POA: Insufficient documentation

## 2012-07-12 ENCOUNTER — Ambulatory Visit (INDEPENDENT_AMBULATORY_CARE_PROVIDER_SITE_OTHER): Payer: Medicare Other | Admitting: Internal Medicine

## 2012-07-12 ENCOUNTER — Encounter: Payer: Self-pay | Admitting: Internal Medicine

## 2012-07-12 ENCOUNTER — Other Ambulatory Visit: Payer: Self-pay | Admitting: *Deleted

## 2012-07-12 VITALS — BP 160/90 | HR 59 | Temp 97.6°F | Resp 18 | Wt 156.0 lb

## 2012-07-12 DIAGNOSIS — I10 Essential (primary) hypertension: Secondary | ICD-10-CM

## 2012-07-12 DIAGNOSIS — E039 Hypothyroidism, unspecified: Secondary | ICD-10-CM

## 2012-07-12 MED ORDER — LEVOTHYROXINE SODIUM 150 MCG PO TABS: 150.0000 ug | ORAL_TABLET | Freq: Every day | ORAL | Status: DC

## 2012-07-12 NOTE — Patient Instructions (Signed)
Limit your sodium (Salt) intake    It is important that you exercise regularly, at least 20 minutes 3 to 4 times per week.  If you develop chest pain or shortness of breath seek  medical attention.  You need to lose weight.  Consider a lower calorie diet and regular exercise.  Return in 6 months for follow-up   

## 2012-07-12 NOTE — Progress Notes (Signed)
Subjective:    Patient ID: Jasmine Wheeler, female    DOB: 08-Apr-1946, 66 y.o.   MRN: [id number]  HPI  67 year old patient who is seen today for followup. Her appointment is slightly early do to a two-week history of palpitations. She describes occasional skipped beats associated with slight weakness. Her caffeine load is modest. She does have along history of hypothyroidism in October her TSH was depressed. Thyroid dose was not adjusted because she has been on a stable dose for years and even at her present dose her TSH has also been slightly elevated in the past. She takes her Synthroid on an empty stomach in the morning and does not eat or take other medications for at least 30 minutes to an hour. She generally feels well. There has been some significant weight loss do to a weight watchers program  Past Medical History  Diagnosis Date  . Hypertension   . Asthma   . Adenomatous colon polyp   . Hypothyroidism   . Chronic low back pain     History   Social History  . Marital Status: Married    Spouse Name: N/A    Number of Children: 3  . 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 Smoking status: Never Smoker   . Smokeless tobacco: Never Used  . Alcohol Use: No  . Drug Use: No  . Sexually Active: Not on file   Other Topics Concern  . Not on file   Social History Narrative  . No narrative on file    Past Surgical History  Procedure Laterality Date  . Hemicolectomy  02/15/10    right, tubulovillous adenoma appendix, Dr. Corliss Skains  . Abdominal hysterectomy    . Repeat cesarean section      3 in all, last 1973  . Lumbar epidural injection  2011    x 2    Family History  Problem Relation Age of Onset  . Lung cancer Father   . Heart disease Father   . Colon cancer Mother   . Colon cancer Maternal Uncle   . Colon cancer Maternal Uncle     Allergies  Allergen Reactions  . Albuterol     REACTION: unspecified  .  Nifedipine     REACTION: unspecified  . Theophylline     REACTION: unspecified    Current Outpatient Prescriptions on File Prior to Visit  Medication Sig Dispense Refill  . Ascorbic Acid (VITAMIN C) 500 MG tablet daily. Take 2 tablet daily       . aspirin 81 MG EC tablet Take 81 mg by mouth daily.        . Calcium Carbonate-Vitamin D (CALCIUM-VITAMIN D) 500-200 MG-UNIT per tablet Take 1 tablet by mouth 2 (two) times daily with a meal.        . Cranberry 1000 MG CAPS Take by mouth. Take 2 tablets daily       . diltiazem (TAZTIA XT) 360 MG 24 hr capsule Take 1 capsule (360 mg total) by mouth daily.  90 capsule  4  . fish oil-omega-3 fatty acids 1000 MG capsule Take 2 g by mouth 2 (two) times daily.        Marland Kitchen glucosamine-chondroitin 500-400 MG tablet Take 1 tablet by mouth daily.        Marland Kitchen levothyroxine (SYNTHROID, LEVOTHROID) 200 MCG tablet Take 1 tablet (200 mcg total) by mouth daily.  90 tablet  3  .  loperamide (IMODIUM A-D) 2 MG tablet Take 1 tablet (2 mg total) by mouth 4 (four) times daily as needed for diarrhea/loose stools. You may find that taking 1 or 2 every AM will help control the loose stools.  30 tablet  0  . metoprolol succinate (TOPROL-XL) 50 MG 24 hr tablet Take 1 tablet (50 mg total) by mouth 2 (two) times daily. Take with or immediately following a meal.  180 tablet  3  . Multiple Vitamin (MULTIVITAMIN) tablet Take 1 tablet by mouth daily.        . quinapril (ACCUPRIL) 40 MG tablet Take 1 tablet (40 mg total) by mouth 2 (two) times daily.  180 tablet  3  . spironolactone (ALDACTONE) 25 MG tablet Take 1 tablet (25 mg total) by mouth daily.  90 tablet  3  . vitamin B-12 (CYANOCOBALAMIN) 500 MCG tablet Take 500 mcg by mouth daily.        . [DISCONTINUED] diltiazem (CARDIZEM CD) 360 MG 24 hr capsule Take 1 capsule (360 mg total) by mouth daily.  90 capsule  3   No current facility-administered medications on file prior to visit.    BP 160/90  Pulse 59  Temp(Src) 97.6 F (36.4  C) (Oral)  Resp 18  Wt 156 lb (70.761 kg)  BMI 28.31 kg/m2  SpO2 95%       Review of Systems  Constitutional: Negative.   HENT: Negative for hearing loss, congestion, sore throat, rhinorrhea, dental problem, sinus pressure and tinnitus.   Eyes: Negative for pain, discharge and visual disturbance.  Respiratory: Negative for cough and shortness of breath.   Cardiovascular: Positive for palpitations. Negative for chest pain and leg swelling.  Gastrointestinal: Negative for nausea, vomiting, abdominal pain, diarrhea, constipation, blood in stool and abdominal distention.  Genitourinary: Negative for dysuria, urgency, frequency, hematuria, flank pain, vaginal bleeding, vaginal discharge, difficulty urinating, vaginal pain and pelvic pain.  Musculoskeletal: Negative for joint swelling, arthralgias and gait problem.  Skin: Negative for rash.  Neurological: Negative for dizziness, syncope, speech difficulty, weakness, numbness and headaches.  Hematological: Negative for adenopathy.  Psychiatric/Behavioral: Negative for behavioral problems, dysphoric mood and agitation. The patient is not nervous/anxious.        Objective:   Physical Exam  Constitutional: She is oriented to person, place, and time. She appears well-developed and well-nourished.  Blood pressure 156/78  HENT:  Head: Normocephalic.  Right Ear: External ear normal.  Left Ear: External ear normal.  Mouth/Throat: Oropharynx is clear and moist.  Eyes: Conjunctivae and EOM are normal. Pupils are equal, round, and reactive to light.  Neck: Normal range of motion. Neck supple. No thyromegaly present.  Cardiovascular: Normal rate, regular rhythm, normal heart sounds and intact distal pulses.   Slow and regular without ectopics  Pulmonary/Chest: Effort normal and breath sounds normal.  Abdominal: Soft. Bowel sounds are normal. She exhibits no mass. There is no tenderness.  Musculoskeletal: Normal range of motion.   Lymphadenopathy:    She has no cervical adenopathy.  Neurological: She is alert and oriented to person, place, and time.  Skin: Skin is warm and dry. No rash noted.  Psychiatric: She has a normal mood and affect. Her behavior is normal.          Assessment & Plan:   Palpitations History of suppressed TSH. We'll repeat today is still suppressed will decrease her Synthroid dose Hypertension. Multi-drug-resistant Recheck 6 months

## 2012-07-12 NOTE — Progress Notes (Signed)
  Subjective:    Patient ID: Jasmine Wheeler, female    DOB: [birth year], 67 y.o.   MRN: [id number]  HPI  Wt Readings from Last 3 Encounters:  07/12/12 156 lb (70.761 kg)  02/09/12 172 lb (78.019 kg)  08/08/11 178 lb (80.74 kg)    Review of Systems     Objective:   Physical Exam        Assessment & Plan:

## 2012-08-09 ENCOUNTER — Ambulatory Visit: Payer: Medicare Other | Admitting: Internal Medicine

## 2012-12-22 ENCOUNTER — Telehealth: Payer: Self-pay | Admitting: Internal Medicine

## 2012-12-22 NOTE — Telephone Encounter (Signed)
Patient is having fecal incontinence and according to the last office note her colonoscopy recall should be October of this year.  I have set her up for an office visit for 01/20/13 for fecal incontinence

## 2012-12-29 ENCOUNTER — Other Ambulatory Visit: Payer: Self-pay

## 2013-01-10 ENCOUNTER — Other Ambulatory Visit: Payer: Self-pay | Admitting: *Deleted

## 2013-01-10 ENCOUNTER — Ambulatory Visit (INDEPENDENT_AMBULATORY_CARE_PROVIDER_SITE_OTHER): Payer: Medicare Other | Admitting: Internal Medicine

## 2013-01-10 ENCOUNTER — Encounter: Payer: Self-pay | Admitting: Internal Medicine

## 2013-01-10 VITALS — BP 132/80 | HR 56 | Temp 98.1°F | Resp 20 | Wt 161.0 lb

## 2013-01-10 DIAGNOSIS — Z23 Encounter for immunization: Secondary | ICD-10-CM

## 2013-01-10 DIAGNOSIS — E039 Hypothyroidism, unspecified: Secondary | ICD-10-CM

## 2013-01-10 DIAGNOSIS — I10 Essential (primary) hypertension: Secondary | ICD-10-CM

## 2013-01-10 LAB — TSH: TSH: 4.94 u[IU]/mL (ref 0.35–5.50)

## 2013-01-10 MED ORDER — LEVOTHYROXINE SODIUM 175 MCG PO TABS: 175.0000 ug | ORAL_TABLET | Freq: Every day | ORAL | Status: DC

## 2013-01-10 NOTE — Progress Notes (Signed)
  Subjective:    Patient ID: Jasmine Wheeler, female    DOB: 17-Feb-1946, 67 y.o.   MRN: [id number]  HPI  Wt Readings from Last 3 Encounters:  01/10/13 161 lb (73.029 kg)  07/12/12 156 lb (70.761 kg)  02/09/12 172 lb (78.019 kg)    Review of Systems     Objective:   Physical Exam        Assessment & Plan:

## 2013-01-10 NOTE — Progress Notes (Signed)
Subjective:    Patient ID: Jasmine Wheeler, female    DOB: [birth year], 67 y.o.   MRN: [id number]  HPI   67 year old patient who is seen today for her biannual followup. She has a history of hypertension as well as hypothyroidism. 6 months ago her levothyroxine was down titrated do to a suppressed TSH. She has had much more difficulty losing weight in spite of a weight watchers program. Currently feels well.  Past Medical History  Diagnosis Date  . Hypertension   . Asthma   . Adenomatous colon polyp   . Hypothyroidism   . Chronic low back pain     History   Social History  . Marital Status: Married    Spouse Name: N/A    Number of Children: 3  . 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 Smoking status: Never Smoker   . Smokeless tobacco: Never Used  . Alcohol Use: No  . Drug Use: No  . Sexual Activity: Not on file   Other Topics Concern  . Not on file   Social History Narrative  . No narrative on file    Past Surgical History  Procedure Laterality Date  . Hemicolectomy  02/15/10    right, tubulovillous adenoma appendix, Dr. Corliss Skains  . Abdominal hysterectomy    . Repeat cesarean section      3 in all, last 1973  . Lumbar epidural injection  2011    x 2    Family History  Problem Relation Age of Onset  . Lung cancer Father   . Heart disease Father   . Colon cancer Mother   . Colon cancer Maternal Uncle   . Colon cancer Maternal Uncle     Allergies  Allergen Reactions  . Albuterol     REACTION: unspecified  . Nifedipine     REACTION: unspecified  . Theophylline     REACTION: unspecified    Current Outpatient Prescriptions on File Prior to Visit  Medication Sig Dispense Refill  . Ascorbic Acid (VITAMIN C) 500 MG tablet daily. Take 2 tablet daily       . aspirin 81 MG EC tablet Take 81 mg by mouth daily.        . Calcium Carbonate-Vitamin D (CALCIUM-VITAMIN D) 500-200 MG-UNIT per tablet Take 1  tablet by mouth 2 (two) times daily with a meal.        . Cranberry 1000 MG CAPS Take by mouth. Take 2 tablets daily       . diltiazem (TAZTIA XT) 360 MG 24 hr capsule Take 1 capsule (360 mg total) by mouth daily.  90 capsule  4  . fish oil-omega-3 fatty acids 1000 MG capsule Take 2 g by mouth 2 (two) times daily.        Marland Kitchen glucosamine-chondroitin 500-400 MG tablet Take 1 tablet by mouth daily.        Marland Kitchen levothyroxine (SYNTHROID, LEVOTHROID) 150 MCG tablet Take 1 tablet (150 mcg total) by mouth daily.  90 tablet  3  . loperamide (IMODIUM A-D) 2 MG tablet Take 1 tablet (2 mg total) by mouth 4 (four) times daily as needed for diarrhea/loose stools. You may find that taking 1 or 2 every AM will help control the loose stools.  30 tablet  0  . metoprolol succinate (TOPROL-XL) 50 MG 24 hr tablet Take 1 tablet (50 mg total) by mouth 2 (two) times daily. Take with  or immediately following a meal.  180 tablet  3  . Multiple Vitamin (MULTIVITAMIN) tablet Take 1 tablet by mouth daily.        . quinapril (ACCUPRIL) 40 MG tablet Take 1 tablet (40 mg total) by mouth 2 (two) times daily.  180 tablet  3  . spironolactone (ALDACTONE) 25 MG tablet Take 1 tablet (25 mg total) by mouth daily.  90 tablet  3  . vitamin B-12 (CYANOCOBALAMIN) 500 MCG tablet Take 500 mcg by mouth daily.        . [DISCONTINUED] diltiazem (CARDIZEM CD) 360 MG 24 hr capsule Take 1 capsule (360 mg total) by mouth daily.  90 capsule  3   No current facility-administered medications on file prior to visit.    BP 132/80  Pulse 56  Temp(Src) 98.1 F (36.7 C) (Oral)  Resp 20  Wt 161 lb (73.029 kg)  BMI 29.22 kg/m2  SpO2 98%       Review of Systems  Constitutional: Positive for unexpected weight change.  HENT: Negative for hearing loss, congestion, sore throat, rhinorrhea, dental problem, sinus pressure and tinnitus.   Eyes: Negative for pain, discharge and visual disturbance.  Respiratory: Negative for cough and shortness of breath.    Cardiovascular: Negative for chest pain, palpitations and leg swelling.  Gastrointestinal: Negative for nausea, vomiting, abdominal pain, diarrhea, constipation, blood in stool and abdominal distention.  Genitourinary: Negative for dysuria, urgency, frequency, hematuria, flank pain, vaginal bleeding, vaginal discharge, difficulty urinating, vaginal pain and pelvic pain.  Musculoskeletal: Negative for joint swelling, arthralgias and gait problem.  Skin: Negative for rash.  Neurological: Negative for dizziness, syncope, speech difficulty, weakness, numbness and headaches.  Hematological: Negative for adenopathy.  Psychiatric/Behavioral: Negative for behavioral problems, dysphoric mood and agitation. The patient is not nervous/anxious.        Objective:   Physical Exam  Constitutional: She is oriented to person, place, and time. She appears well-developed and well-nourished.  HENT:  Head: Normocephalic.  Right Ear: External ear normal.  Left Ear: External ear normal.  Mouth/Throat: Oropharynx is clear and moist.  Eyes: Conjunctivae and EOM are normal. Pupils are equal, round, and reactive to light.  Neck: Normal range of motion. Neck supple. No thyromegaly present.  Cardiovascular: Normal rate, regular rhythm, normal heart sounds and intact distal pulses.   Pulmonary/Chest: Effort normal and breath sounds normal.  Abdominal: Soft. Bowel sounds are normal. She exhibits no mass. There is no tenderness.  Musculoskeletal: Normal range of motion.  Lymphadenopathy:    She has no cervical adenopathy.  Neurological: She is alert and oriented to person, place, and time.  Skin: Skin is warm and dry. No rash noted.  Psychiatric: She has a normal mood and affect. Her behavior is normal.          Assessment & Plan:   Hypertension. Well controlled Hypothyroidism. We'll check a TSH Asthma stable  CPX 6 months

## 2013-01-10 NOTE — Patient Instructions (Signed)
Limit your sodium (Salt) intake  Please check your blood pressure on a regular basis.  If it is consistently greater than 150/90, please make an office appointment.    It is important that you exercise regularly, at least 20 minutes 3 to 4 times per week.  If you develop chest pain or shortness of breath seek  medical attention.  Return in 6 months for follow-up  

## 2013-01-20 ENCOUNTER — Encounter: Payer: Self-pay | Admitting: Internal Medicine

## 2013-01-20 ENCOUNTER — Ambulatory Visit (INDEPENDENT_AMBULATORY_CARE_PROVIDER_SITE_OTHER): Payer: Medicare Other | Admitting: Internal Medicine

## 2013-01-20 VITALS — BP 140/82 | HR 48 | Ht 62.5 in | Wt 158.0 lb

## 2013-01-20 DIAGNOSIS — K648 Other hemorrhoids: Secondary | ICD-10-CM

## 2013-01-20 DIAGNOSIS — K644 Residual hemorrhoidal skin tags: Secondary | ICD-10-CM

## 2013-01-20 DIAGNOSIS — Z86010 Personal history of colonic polyps: Secondary | ICD-10-CM

## 2013-01-20 MED ORDER — MOVIPREP 100 G PO SOLR: ORAL | Status: DC

## 2013-01-20 NOTE — Assessment & Plan Note (Signed)
She is interested in hemorrhoid ligation. We can discuss this after the colonoscopy. I have given her preliminary discussion about that today.

## 2013-01-20 NOTE — Patient Instructions (Addendum)
You have been scheduled for a colonoscopy with propofol. Please follow written instructions given to you at your visit today.  Please use the moviprep sample we gave you today. If you use inhalers (even only as needed), please bring them with you on the day of your procedure. Your physician has requested that you go to www.startemmi.com and enter the access code given to you at your visit today. This web site gives a general overview about your procedure. However, you should still follow specific instructions given to you by our office regarding your preparation for the procedure.  Today we are giving you information on the [facility] to read over.  I appreciate the opportunity to care for you.

## 2013-01-20 NOTE — Progress Notes (Signed)
  Subjective:    Patient ID: Jasmine Wheeler, female    DOB: 26-Aug-1945, 67 y.o.   MRN: [id number]  HPI Patient is here for routine followup, to schedule a followup colonoscopy. 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 In general she does okay but occasionally has some abdominal cramps after eating. She says it's not too bad she does wonders what causes it. She does have prolapsing hemorrhoids and difficulty cleansing and seepage of stool and mucus into the underwear at times as well. She would like therapy for hemorrhoids if possible. Medications, allergies, past medical history, past surgical history, family history and social history are reviewed and updated in the EMR.  Review of Systems As per history of present illness    Objective:   Physical Exam General:  NAD Eyes:   anicteric Lungs:  clear Heart:  S1S2 no rubs, murmurs or gallops Abdomen:  soft and nontender, BS+ Ext:   no edema  Rectal:  Female staff present Anoderm inspection revealed multiple tags Digital exam revealed slightly decreased resting tone   Anoscopy was performed with the patient in the left lateral decubitus position while a chaperone was present and revealed internal and external hemorrhoids Grade 1-2, most prominent in LL   Data Reviewed:  Prior colonoscopies, operative note pathology report        Assessment & Plan:   1. Personal history of colonic polyps   2. Internal and external prolapsed hemorrhoids

## 2013-01-20 NOTE — Assessment & Plan Note (Signed)
Will schedule for surveillance colonoscopy.The risks and benefits as well as alternatives of endoscopic procedure(s) have been discussed and reviewed. All questions answered. The patient agrees to proceed.

## 2013-01-21 ENCOUNTER — Ambulatory Visit (AMBULATORY_SURGERY_CENTER): Payer: Medicare Other | Admitting: Internal Medicine

## 2013-01-21 ENCOUNTER — Encounter: Payer: Self-pay | Admitting: Internal Medicine

## 2013-01-21 VITALS — BP 149/74 | HR 48 | Temp 98.1°F | Resp 14 | Ht 62.5 in | Wt 158.0 lb

## 2013-01-21 DIAGNOSIS — K644 Residual hemorrhoidal skin tags: Secondary | ICD-10-CM

## 2013-01-21 DIAGNOSIS — D126 Benign neoplasm of colon, unspecified: Secondary | ICD-10-CM

## 2013-01-21 DIAGNOSIS — Z860100 Personal history of colon polyps, unspecified: Secondary | ICD-10-CM

## 2013-01-21 DIAGNOSIS — K648 Other hemorrhoids: Secondary | ICD-10-CM

## 2013-01-21 MED ORDER — SODIUM CHLORIDE 0.9 % IV SOLN: 500.0000 mL | INTRAVENOUS | Status: DC

## 2013-01-21 NOTE — Progress Notes (Signed)
Report to pacu rn, vss, bbs=clear 

## 2013-01-21 NOTE — Progress Notes (Signed)
Patient did not have preoperative order for IV antibiotic SSI prophylaxis. (G8918)  Patient did not experience any of the following events: a burn prior to discharge; a fall within the facility; wrong site/side/patient/procedure/implant event; or a hospital transfer or hospital admission upon discharge from the facility. (G8907)  

## 2013-01-21 NOTE — Op Note (Addendum)
[facility] [address] [place], [address]   COLONOSCOPY PROCEDURE REPORT  PATIENT: Jasmine Wheeler, Jasmine Wheeler  MR#: [id number] BIRTHDATE: 04/24/1945 , 67  yrs. old GENDER: Female ENDOSCOPIST: Iva Boop, MD, [facility] PROCEDURE DATE:  01/21/2013 PROCEDURE:   Colonoscopy with snare polypectomy First Screening Colonoscopy - Avg.  risk and is 50 yrs.  old or older - No.  Prior Negative Screening - Now for repeat screening. N/A  History of Adenoma - Now for follow-up colonoscopy & has been > or = to 3 yrs.  Yes hx of adenoma.  Has been 3 or more years since last colonoscopy.  Polyps Removed Today? Yes. ASA CLASS:   Class II INDICATIONS:Patient's personal history of adenomatous colon polyps. Last exam 3 yrs ago - had surgical resection TV adenoma appendix MEDICATIONS: propofol (Diprivan) 200mg  IV, MAC sedation, administered by CRNA, and These medications were titrated to patient response per physician's verbal order  DESCRIPTION OF PROCEDURE:   After the risks benefits and alternatives of the procedure were thoroughly explained, informed consent was obtained.  A digital rectal exam revealed several skin tags and A digital rectal exam revealed no rectal mass.   The endoscope was introduced through the anus and advanced to the surgical anastomosis. No adverse events experienced.   The quality of the prep was good, using MoviPrep  The instrument was then slowly withdrawn as the colon was fully examined.      COLON FINDINGS: A semi-pedunculated polyp measuring 8 mm in size was found in the sigmoid colon.  A polypectomy was performed using snare cautery.  The resection was complete and the polyp tissue was completely retrieved.   Severe diverticulosis was noted in the sigmoid colon.   The colon mucosa was otherwise normal.   There was evidence of a prior end-to-side ileocolonic surgical anastomosis The finding was in the right colon.  Retroflexed views revealed internal  hemorrhoids. The time to cecum=2 minutes 32 seconds. Withdrawal time=9 minutes 43 seconds.  The scope was withdrawn and the procedure completed. COMPLICATIONS: There were no complications.  ENDOSCOPIC IMPRESSION: 1.   Semi-pedunculated polyp measuring 8 mm in size was found in the sigmoid colon; polypectomy was performed using snare cautery 2.   Severe diverticulosis was noted in the sigmoid colon 3.   The colon mucosa was otherwise normal - good prep 4.   There was evidence of a prior ileocolonic surgical anastomosis in the right colon 5.   Internal hemorrhoids  RECOMMENDATIONS: 1.  Timing of repeat colonoscopy will be determined by pathology findings. 2.   My office will contact her to arrange hemorrhoid banding. 3. Hold any aspirin or NSAID's until 10/10   eSigned:  Iva Boop, MD, [facility] 01/21/2013 2:09 PM Revised: 01/21/2013 2:09 PM  cc: The Patient

## 2013-01-21 NOTE — Patient Instructions (Addendum)
I found and removed one polyp today. You also have diverticulosis and hemorrhoids.  I will let you know pathology results and when to have another routine colonoscopy by mail.  My office will contact you to make an appointment to start treating the hemorrhoids.  I appreciate the opportunity to care for you. Iva Boop, MD, FACG  YOU HAD AN ENDOSCOPIC PROCEDURE TODAY AT THE [facility]: Refer to the procedure report that was given to you for any specific questions about what was found during the examination.  If the procedure report does not answer your questions, please call your gastroenterologist to clarify.  If you requested that your care partner not be given the details of your procedure findings, then the procedure report has been included in a sealed envelope for you to review at your convenience later.  YOU SHOULD EXPECT: Some feelings of bloating in the abdomen. Passage of more gas than usual.  Walking can help get rid of the air that was put into your GI tract during the procedure and reduce the bloating. If you had a lower endoscopy (such as a colonoscopy or flexible sigmoidoscopy) you may notice spotting of blood in your stool or on the toilet paper. If you underwent a bowel prep for your procedure, then you may not have a normal bowel movement for a few days.  DIET: Your first meal following the procedure should be a light meal and then it is ok to progress to your normal diet.  A half-sandwich or bowl of soup is an example of a good first meal.  Heavy or fried foods are harder to digest and may make you feel nauseous or bloated.  Likewise meals heavy in dairy and vegetables can cause extra gas to form and this can also increase the bloating.  Drink plenty of fluids but you should avoid alcoholic beverages for 24 hours.  ACTIVITY: Your care partner should take you home directly after the procedure.  You should plan to take it easy, moving slowly for the rest of the day.   You can resume normal activity the day after the procedure however you should NOT DRIVE or use heavy machinery for 24 hours (because of the sedation medicines used during the test).    SYMPTOMS TO REPORT IMMEDIATELY: A gastroenterologist can be reached at any hour.  During normal business hours, 8:30 AM to 5:00 PM Monday through Friday, call [phone].  After hours and on weekends, please call the GI answering service at [phone] who will take a message and have the physician on call contact you.   Following lower endoscopy (colonoscopy or flexible sigmoidoscopy):  Excessive amounts of blood in the stool  Significant tenderness or worsening of abdominal pains  Swelling of the abdomen that is new, acute  Fever of 100F or higher  FOLLOW UP: If any biopsies were taken you will be contacted by phone or by letter within the next 1-3 weeks.  Call your gastroenterologist if you have not heard about the biopsies in 3 weeks.  Our staff will call the home number listed on your records the next business day following your procedure to check on you and address any questions or concerns that you may have at that time regarding the information given to you following your procedure. This is a courtesy call and so if there is no answer at the home number and we have not heard from you through the emergency physician on call, we will assume that  you have returned to your regular daily activities without incident.  SIGNATURES/CONFIDENTIALITY: You and/or your care partner have signed paperwork which will be entered into your electronic medical record.  These signatures attest to the fact that that the information above on your After Visit Summary has been reviewed and is understood.  Full responsibility of the confidentiality of this discharge information lies with you and/or your care-partner.

## 2013-01-21 NOTE — Progress Notes (Signed)
Called to room to assist during endoscopic procedure.  Patient ID and intended procedure confirmed with present staff. Received instructions for my participation in the procedure from the performing physician.  

## 2013-01-24 ENCOUNTER — Telehealth: Payer: Self-pay | Admitting: *Deleted

## 2013-01-24 NOTE — Telephone Encounter (Signed)
  Follow up Call-  Call back number 01/21/2013  Post procedure Call Back phone  # [phone]  Permission to leave phone message Yes     Patient questions:  Do you have a fever, pain , or abdominal swelling? no Pain Score  0 *  Have you tolerated food without any problems? yes  Have you been able to return to your normal activities? yes  Do you have any questions about your discharge instructions: Diet   no Medications  no Follow up visit  no  Do you have questions or concerns about your Care? no  Actions: * If pain score is 4 or above: No action needed, pain <4.

## 2013-01-28 ENCOUNTER — Encounter: Payer: Self-pay | Admitting: Internal Medicine

## 2013-01-28 NOTE — Progress Notes (Signed)
Quick Note:  Not a pre-cancerous polyp Repeat colonoscopy 2024 if fit ______

## 2013-02-05 ENCOUNTER — Other Ambulatory Visit: Payer: Self-pay | Admitting: Internal Medicine

## 2013-02-09 ENCOUNTER — Ambulatory Visit (INDEPENDENT_AMBULATORY_CARE_PROVIDER_SITE_OTHER): Payer: Medicare Other | Admitting: Internal Medicine

## 2013-02-09 ENCOUNTER — Encounter: Payer: Self-pay | Admitting: Internal Medicine

## 2013-02-09 VITALS — BP 110/80 | HR 76 | Ht 62.5 in | Wt 161.0 lb

## 2013-02-09 DIAGNOSIS — K648 Other hemorrhoids: Secondary | ICD-10-CM

## 2013-02-09 DIAGNOSIS — K644 Residual hemorrhoidal skin tags: Secondary | ICD-10-CM

## 2013-02-09 NOTE — Assessment & Plan Note (Signed)
LL column banded today. Tolerated well. Will start fiber supplementation and have her return in about 2 weeks

## 2013-02-09 NOTE — Progress Notes (Signed)
Patient ID: Jasmine Wheeler, female   DOB: 11-Apr-1946, 67 y.o.   MRN: [id number]   PROCEDURE NOTE: The patient presents with symptomatic grade 1-2  hemorrhoids, requesting rubber band ligation of his/her hemorrhoidal disease.  All risks, benefits and alternative forms of therapy were described and informed consent was obtained. She c/o of some prolapse with spontaneous reduction and fecal seepage. She had anoscopy 10/2 with LL column largest but all 3 columns of internal and some external hemorrhoids.  Rectal today shows fleshy tags in all positions, largest RA. Female staff present for exam and procedure.  The decision was made to band the LL internal hemorrhoid, and the [facility] System was used to perform band ligation without complication.  Digital anorectal examination was then performed to assure proper positioning of the band, and to adjust the banded tissue as required.  The patient was discharged home without pain or other issues.  Dietary and behavioral recommendations were given and along with follow-up instructions.     The following adjunctive treatments were recommended:  Benefiber 2 tbsp daily  The patient will return in 2 weeks  for  follow-up and possible additional banding as required. No complications were encountered and the patient tolerated the procedure well.

## 2013-02-09 NOTE — Patient Instructions (Addendum)
HEMORRHOID BANDING PROCEDURE    FOLLOW-UP CARE   1. The procedure you have had should have been relatively painless since the banding of the area involved does not have nerve endings and there is no pain sensation.  The rubber band cuts off the blood supply to the hemorrhoid and the band may fall off as soon as 48 hours after the banding (the band may occasionally be seen in the toilet bowl following a bowel movement). You may notice a temporary feeling of fullness in the rectum which should respond adequately to plain Tylenol or Motrin.  2. Following the banding, avoid strenuous exercise that evening and resume full activity the next day.  A sitz bath (soaking in a warm tub) or bidet is soothing, and can be useful for cleansing the area after bowel movements.     3. To avoid constipation, take two tablespoons of natural wheat bran, natural oat bran, flax, Benefiber or any over the counter fiber supplement and increase your water intake to 7-8 glasses daily.    4. Unless you have been prescribed anorectal medication, do not put anything inside your rectum for two weeks: No suppositories, enemas, fingers, etc.  5. Occasionally, you may have more bleeding than usual after the banding procedure.  This is often from the untreated hemorrhoids rather than the treated one.  Don't be concerned if there is a tablespoon or so of blood.  If there is more blood than this, lie flat with your bottom higher than your head and apply an ice pack to the area. If the bleeding does not stop within a half an hour or if you feel faint, call our office at [phone] or go to the emergency room.  6. Problems are not common; however, if there is a substantial amount of bleeding, severe pain, chills, fever or difficulty passing urine (very rare) or other problems, you should call us at [phone] or report to the nearest emergency room.  7. Do not stay seated continuously for more than 2-3 hours for a day or two  after the procedure.  Tighten your buttock muscles 10-15 times every two hours and take 10-15 deep breaths every 1-2 hours.  Do not spend more than a few minutes on the toilet if you cannot empty your bowel; instead re-visit the toilet at a later time.    Please use a fiber supplement , sheet provided today.  Follow up with [place] in 2 weeks for your next banding. Appointment made 02/23/13.  We will send you a recall letter for your next colon in Oct 2019.  It is the time of year to have a vaccination to prevent the flu (influenza virus).  Please have this done through your primary care provider or you can get this done at local pharmacies or the [facility]. It would be very helpful if you notify your primary care provider when and where you had the vaccination given by messaging them in My Chart, leaving a message or faxing the information.   I appreciate the opportunity to care for you.

## 2013-02-23 ENCOUNTER — Ambulatory Visit (INDEPENDENT_AMBULATORY_CARE_PROVIDER_SITE_OTHER): Payer: Medicare Other | Admitting: Internal Medicine

## 2013-02-23 ENCOUNTER — Encounter: Payer: Self-pay | Admitting: Internal Medicine

## 2013-02-23 VITALS — BP 150/78 | HR 64 | Ht 62.5 in | Wt 159.2 lb

## 2013-02-23 DIAGNOSIS — K644 Residual hemorrhoidal skin tags: Secondary | ICD-10-CM

## 2013-02-23 DIAGNOSIS — K648 Other hemorrhoids: Secondary | ICD-10-CM

## 2013-02-23 NOTE — Assessment & Plan Note (Signed)
RA and RP piles banded Reduce fiber to 1 tablespoon daily Phone call f/u 2 months

## 2013-02-23 NOTE — Patient Instructions (Signed)
HEMORRHOID BANDING PROCEDURE    FOLLOW-UP CARE   1. The procedure you have had should have been relatively painless since the banding of the area involved does not have nerve endings and there is no pain sensation.  The rubber band cuts off the blood supply to the hemorrhoid and the band may fall off as soon as 48 hours after the banding (the band may occasionally be seen in the toilet bowl following a bowel movement). You may notice a temporary feeling of fullness in the rectum which should respond adequately to plain Tylenol or Motrin.  2. Following the banding, avoid strenuous exercise that evening and resume full activity the next day.  A sitz bath (soaking in a warm tub) or bidet is soothing, and can be useful for cleansing the area after bowel movements.     3. To avoid constipation, take two tablespoons of natural wheat bran, natural oat bran, flax, Benefiber or any over the counter fiber supplement and increase your water intake to 7-8 glasses daily.    4. Unless you have been prescribed anorectal medication, do not put anything inside your rectum for two weeks: No suppositories, enemas, fingers, etc.  5. Occasionally, you may have more bleeding than usual after the banding procedure.  This is often from the untreated hemorrhoids rather than the treated one.  Don't be concerned if there is a tablespoon or so of blood.  If there is more blood than this, lie flat with your bottom higher than your head and apply an ice pack to the area. If the bleeding does not stop within a half an hour or if you feel faint, call our office at [phone] or go to the emergency room.  6. Problems are not common; however, if there is a substantial amount of bleeding, severe pain, chills, fever or difficulty passing urine (very rare) or other problems, you should call us at [phone] or report to the nearest emergency room.  7. Do not stay seated continuously for more than 2-3 hours for a day or two  after the procedure.  Tighten your buttock muscles 10-15 times every two hours and take 10-15 deep breaths every 1-2 hours.  Do not spend more than a few minutes on the toilet if you cannot empty your bowel; instead re-visit the toilet at a later time.    Reduce her Benefiber to once a day.  We will be in touch.  I appreciate the opportunity to care for you.

## 2013-02-23 NOTE — Progress Notes (Signed)
Patient ID: Jasmine Wheeler, female   DOB: 1946/03/14, 67 y.o.   MRN: [id number]  PROCEDURE NOTE: The patient presents with symptomatic grade 1-2  Hemorrhoids - prolapse and mucous dc, requesting rubber band ligation of his/her hemorrhoidal disease.  All risks, benefits and alternative forms of therapy were described and informed consent was obtained.  She reports reduced fecal seepage after LL banding and using Benefiber  The decision was made to band the RP and RA  internal hemorrhoids, and the [facility] System was used to perform band ligation without complication.  Digital anorectal examination was then performed to assure proper positioning of the band, and to adjust the banded tissue as required.  The patient was discharged home without pain or other issues.  Dietary and behavioral recommendations were given and along with follow-up instructions.     The following adjunctive treatments were recommended:  Benefiber 1 tbsp daily  The patient will return in as needed, we will plan to call in about 2 months -  for  follow-up and possible additional banding as required. No complications were encountered and the patient tolerated the procedure well.

## 2013-02-26 ENCOUNTER — Other Ambulatory Visit: Payer: Self-pay | Admitting: Internal Medicine

## 2013-03-24 ENCOUNTER — Other Ambulatory Visit: Payer: Self-pay | Admitting: Internal Medicine

## 2013-05-02 ENCOUNTER — Other Ambulatory Visit (HOSPITAL_COMMUNITY): Payer: Self-pay | Admitting: Obstetrics and Gynecology

## 2013-05-02 DIAGNOSIS — M810 Age-related osteoporosis without current pathological fracture: Secondary | ICD-10-CM

## 2013-05-04 ENCOUNTER — Telehealth: Payer: Self-pay | Admitting: Internal Medicine

## 2013-05-04 NOTE — Telephone Encounter (Signed)
Pt has changed insurance companies to [employer] and will be getting rx from [facility]. They will be sending a fax later tod

## 2013-05-05 MED ORDER — LEVOTHYROXINE SODIUM 175 MCG PO TABS: 175.0000 ug | ORAL_TABLET | Freq: Every day | ORAL | Status: DC

## 2013-05-05 MED ORDER — SPIRONOLACTONE 25 MG PO TABS: ORAL_TABLET | ORAL | Status: DC

## 2013-05-05 MED ORDER — METOPROLOL SUCCINATE ER 50 MG PO TB24: ORAL_TABLET | ORAL | Status: DC

## 2013-05-05 MED ORDER — DILTIAZEM HCL ER BEADS 360 MG PO CP24: ORAL_CAPSULE | ORAL | Status: DC

## 2013-05-05 MED ORDER — QUINAPRIL HCL 40 MG PO TABS: ORAL_TABLET | ORAL | Status: DC

## 2013-05-05 NOTE — Addendum Note (Signed)
Addended by: Marian Sorrow on: 05/05/2013 04:53 PM   Modules accepted: Orders

## 2013-05-05 NOTE — Telephone Encounter (Signed)
Noted  

## 2013-05-05 NOTE — Telephone Encounter (Signed)
Rx refill(s) sent.

## 2013-05-05 NOTE — Telephone Encounter (Signed)
[facility] calling to request new scripts for the following:  levothyroxine (SYNTHROID, LEVOTHROID) 175 MCG tablet quinapril (ACCUPRIL) 40 MG tablet Taztia XT metoprolol succinate (TOPROL-XL) 50 MG 24 hr tablet spironolactone (ALDACTONE) 25 MG tablet Ipratropium-albuterol

## 2013-05-12 ENCOUNTER — Other Ambulatory Visit (HOSPITAL_COMMUNITY): Payer: Self-pay | Admitting: Obstetrics and Gynecology

## 2013-05-12 DIAGNOSIS — Z1231 Encounter for screening mammogram for malignant neoplasm of breast: Secondary | ICD-10-CM

## 2013-05-16 ENCOUNTER — Telehealth: Payer: Self-pay | Admitting: Internal Medicine

## 2013-05-16 DIAGNOSIS — L989 Disorder of the skin and subcutaneous tissue, unspecified: Secondary | ICD-10-CM

## 2013-05-16 NOTE — Telephone Encounter (Signed)
Pt needs new referral to dermatologist / Rinaldo Ratel  [phone] due to new insurance. Pt had another dermatologist, but not on her new plan.

## 2013-05-16 NOTE — Telephone Encounter (Signed)
Spoke to pt asked her what she sees dermatologist for? Pt said she has had Melanoma lesions removed and needs to change provider due to insurance. Told pt okay will send referral and someone will contact her for an appt. Pt verbalized understanding.

## 2013-05-26 ENCOUNTER — Telehealth: Payer: Self-pay | Admitting: Internal Medicine

## 2013-05-26 NOTE — Telephone Encounter (Signed)
Fax no: [phone] This is an new referral NPI #[id number]  Dr Rozann Lesches Pt appt at 11:50 am today, They were under impression they needed the paper referral  Only got a verbal. Oda Kilts I will fax memo asap. Dani

## 2013-06-10 ENCOUNTER — Ambulatory Visit (HOSPITAL_COMMUNITY)
Admission: RE | Admit: 2013-06-10 | Discharge: 2013-06-10 | Disposition: A | Discharge status: Home / Self Care | Payer: Medicare HMO | Source: Ambulatory Visit | Attending: Obstetrics and Gynecology | Admitting: Obstetrics and Gynecology

## 2013-06-10 DIAGNOSIS — M810 Age-related osteoporosis without current pathological fracture: Secondary | ICD-10-CM

## 2013-06-10 DIAGNOSIS — Z1231 Encounter for screening mammogram for malignant neoplasm of breast: Secondary | ICD-10-CM

## 2013-06-10 DIAGNOSIS — Z780 Asymptomatic menopausal state: Secondary | ICD-10-CM | POA: Insufficient documentation

## 2013-06-10 DIAGNOSIS — Z13820 Encounter for screening for osteoporosis: Secondary | ICD-10-CM | POA: Insufficient documentation

## 2013-07-04 ENCOUNTER — Other Ambulatory Visit: Payer: Medicare Other

## 2013-07-05 ENCOUNTER — Other Ambulatory Visit (INDEPENDENT_AMBULATORY_CARE_PROVIDER_SITE_OTHER): Payer: Medicare HMO

## 2013-07-05 DIAGNOSIS — Z0000 Encounter for general adult medical examination without abnormal findings: Secondary | ICD-10-CM

## 2013-07-05 LAB — POCT URINALYSIS DIPSTICK
Bilirubin, UA: NEGATIVE
GLUCOSE UA: NEGATIVE
Ketones, UA: NEGATIVE
NITRITE UA: NEGATIVE
PH UA: 7
PROTEIN UA: NEGATIVE
RBC UA: NEGATIVE
Spec Grav, UA: 1.02
UROBILINOGEN UA: 0.2

## 2013-07-05 LAB — CBC WITH DIFFERENTIAL/PLATELET
BASOS ABS: 0 10*3/uL (ref 0.0–0.1)
Basophils Relative: 0.4 % (ref 0.0–3.0)
Eosinophils Absolute: 0.1 10*3/uL (ref 0.0–0.7)
Eosinophils Relative: 3.1 % (ref 0.0–5.0)
HEMATOCRIT: 35.5 % — AB (ref 36.0–46.0)
HEMOGLOBIN: 12 g/dL (ref 12.0–15.0)
LYMPHS ABS: 1.2 10*3/uL (ref 0.7–4.0)
Lymphocytes Relative: 25.8 % (ref 12.0–46.0)
MCHC: 33.8 g/dL (ref 30.0–36.0)
MCV: 89.6 fl (ref 78.0–100.0)
MONOS PCT: 9.4 % (ref 3.0–12.0)
Monocytes Absolute: 0.4 10*3/uL (ref 0.1–1.0)
NEUTROS ABS: 2.8 10*3/uL (ref 1.4–7.7)
Neutrophils Relative %: 61.3 % (ref 43.0–77.0)
Platelets: 208 10*3/uL (ref 150.0–400.0)
RBC: 3.96 Mil/uL (ref 3.87–5.11)
RDW: 13.7 % (ref 11.5–14.6)
WBC: 4.6 10*3/uL (ref 4.5–10.5)

## 2013-07-05 LAB — BASIC METABOLIC PANEL
BUN: 25 mg/dL — ABNORMAL HIGH (ref 6–23)
CHLORIDE: 102 meq/L (ref 96–112)
CO2: 30 mEq/L (ref 19–32)
Calcium: 9 mg/dL (ref 8.4–10.5)
Creatinine, Ser: 1 mg/dL (ref 0.4–1.2)
GFR: 61.79 mL/min (ref >60.00)
Glucose, Bld: 110 mg/dL — ABNORMAL HIGH (ref 70–99)
POTASSIUM: 3.8 meq/L (ref 3.5–5.1)
SODIUM: 139 meq/L (ref 135–145)

## 2013-07-05 LAB — HEPATIC FUNCTION PANEL
ALT: 11 U/L (ref 0–35)
AST: 11 U/L (ref 0–37)
Albumin: 3.9 g/dL (ref 3.5–5.2)
Alkaline Phosphatase: 69 U/L (ref 39–117)
Bilirubin, Direct: 0 mg/dL (ref 0.0–0.3)
Total Bilirubin: 0.6 mg/dL (ref 0.3–1.2)
Total Protein: 6.2 g/dL (ref 6.0–8.3)

## 2013-07-05 LAB — TSH: TSH: 2.02 u[IU]/mL (ref 0.35–5.50)

## 2013-07-05 LAB — LIPID PANEL
Cholesterol: 201 mg/dL — ABNORMAL HIGH (ref 0–200)
HDL: 49.1 mg/dL (ref >39.00)
LDL Cholesterol: 132 mg/dL — ABNORMAL HIGH (ref 0–99)
Total CHOL/HDL Ratio: 4
Triglycerides: 102 mg/dL (ref 0.0–149.0)
VLDL: 20.4 mg/dL (ref 0.0–40.0)

## 2013-07-11 ENCOUNTER — Ambulatory Visit (INDEPENDENT_AMBULATORY_CARE_PROVIDER_SITE_OTHER): Payer: Medicare HMO | Admitting: Internal Medicine

## 2013-07-11 ENCOUNTER — Encounter: Payer: Self-pay | Admitting: Internal Medicine

## 2013-07-11 VITALS — BP 150/80 | HR 61 | Temp 98.0°F | Resp 18 | Ht 62.5 in | Wt 166.0 lb

## 2013-07-11 DIAGNOSIS — N951 Menopausal and female climacteric states: Secondary | ICD-10-CM

## 2013-07-11 DIAGNOSIS — R7302 Impaired glucose tolerance (oral): Secondary | ICD-10-CM

## 2013-07-11 DIAGNOSIS — E039 Hypothyroidism, unspecified: Secondary | ICD-10-CM

## 2013-07-11 DIAGNOSIS — I10 Essential (primary) hypertension: Secondary | ICD-10-CM

## 2013-07-11 DIAGNOSIS — R7309 Other abnormal glucose: Secondary | ICD-10-CM

## 2013-07-11 DIAGNOSIS — Z0000 Encounter for general adult medical examination without abnormal findings: Secondary | ICD-10-CM

## 2013-07-11 DIAGNOSIS — Z23 Encounter for immunization: Secondary | ICD-10-CM

## 2013-07-11 NOTE — Progress Notes (Signed)
Pre-visit discussion using our clinic review tool. No additional management support is needed unless otherwise documented below in the visit note.  

## 2013-07-11 NOTE — Progress Notes (Signed)
Patient ID: Jasmine Wheeler, female   DOB: [birth year], 68 y.o.   MRN: [id number]  Subjective:    Patient ID: Jasmine Wheeler, female    DOB: 01-28-1946, 68 y.o.   MRN: [id number]  HPI    History of Present Illness:   68  year-old patient who is seen today for a comprehensive evaluation. Medical problems include multi-drug-resistant hypertension. She has hypothyroidism. History colonic polyps and a history of mild asthma.  Her last colonoscopy 2014.  She is followed closely by gynecology.   Preventive Screening-Counseling & Management  Alcohol-Tobacco  Smoking Status: never   Allergies:  1) Uniphyl  2) Adalat Cc (Nifedipine)  3) Proventil   Past History:  Past Medical History:  Hypertension  Hypothyroidism  Colonic polyps, hx of tubulovillous adenoma Asthma History of melanoma   Past Surgical History:  Hysterectomy complete  Caesarean section  Colonoscopy-12/06/2004  2014  bone density 2006 2008, 02-2009  2014 Resection left arm melanoma October 2012 Resection tubovillous adenoma October 2011 Hemorrhoidectomy Leroy Kennedy) October 2014  Family History:   Family History Lung cancer  Family History Other cancer-Cervical  Fam hx CAD  Fam hx COPD  Family History Diabetes 1st degree relative  father died at age 31, lung cancer.  Mother history coronary disease, hypertension, COPD, hypothyroidism, CAD; history of rectal cancer  one brother one sister. Positive for CAD s/p CABG and cervical cancer (Sister died age 55)  MGM-CNS Ca  2 maternal uncles-colon ca   Social History:    Married  works at [employer] Status: never   1. Risk factors, based on past  M,S,F history- cardiovascular risk factors include hypertension 2.  Physical activities: Remains quite active still is employed full-time no exercise limitations  3.  Depression/mood: No history depression or mood disorder  4.  Hearing: No deficits  5.  ADL's: Independent in all aspect of daily living  6.   Fall risk: Low  7.  Home safety: No problems identified  8.  Height weight, and visual acuity; height and weight stable no change in visual acuity  9.  Counseling: Heart healthy diet encouraged restricted salt diet encouraged regular exercise encouraged  10. Lab orders based on risk factors: Laboratory profile including TSH and lipid panel reviewed  11. Referral : Will follow up with gynecology in December will have a mammogram and bone density at that time  12. Care plan: We'll regular exercise and modest weight loss encouraged  13. Cognitive assessment: Alert and oriented normal affect no cognitive dysfunction       Review of Systems  Constitutional: Negative for fever, appetite change, fatigue and unexpected weight change.  HENT: Negative for congestion, dental problem, ear pain, hearing loss, mouth sores, nosebleeds, sinus pressure, sore throat, tinnitus, trouble swallowing and voice change.   Eyes: Negative for photophobia, pain, redness and visual disturbance.  Respiratory: Negative for cough, chest tightness and shortness of breath.   Cardiovascular: Negative for chest pain, palpitations and leg swelling.  Gastrointestinal: Negative for nausea, vomiting, abdominal pain, diarrhea, constipation, blood in stool, abdominal distention and rectal pain.  Genitourinary: Negative for dysuria, urgency, frequency, hematuria, flank pain, vaginal bleeding, vaginal discharge, difficulty urinating, genital sores, vaginal pain, menstrual problem and pelvic pain.  Musculoskeletal: Negative for arthralgias, back pain and neck stiffness.  Skin: Negative for rash.  Neurological: Negative for dizziness, syncope, speech difficulty, weakness, light-headedness, numbness and headaches.  Hematological: Negative for adenopathy. Does not bruise/bleed easily.  Psychiatric/Behavioral: Negative for suicidal ideas, behavioral problems, self-injury, dysphoric mood  and agitation. The patient is not  nervous/anxious.        Objective:   Physical Exam  Constitutional: She is oriented to person, place, and time. She appears well-developed and well-nourished.  HENT:  Head: Normocephalic and atraumatic.  Right Ear: External ear normal.  Left Ear: External ear normal.  Mouth/Throat: Oropharynx is clear and moist.  Eyes: Conjunctivae and EOM are normal.  Neck: Normal range of motion. Neck supple. No JVD present. No thyromegaly present.  Cardiovascular: Normal rate, regular rhythm, normal heart sounds and intact distal pulses.   No murmur heard. Pulmonary/Chest: Effort normal and breath sounds normal. She has no wheezes. She has no rales.  Abdominal: Soft. Bowel sounds are normal. She exhibits no distension and no mass. There is no tenderness. There is no rebound and no guarding.  Lower midline surgical scar  Musculoskeletal: Normal range of motion. She exhibits no edema and no tenderness.  Neurological: She is alert and oriented to person, place, and time. She has normal reflexes. No cranial nerve deficit. She exhibits normal muscle tone. Coordination normal.  Skin: Skin is warm and dry. No rash noted.  Surgical scar left upper arm  Psychiatric: She has a normal mood and affect. Her behavior is normal.          Assessment & Plan:   Preventive health examination Hypertension. blood pressure readings have been well controlled in general. We'll continue her present regimen; salt  restricted diet modest weight loss more regular exercise all encouraged Patient is presently following a Weight Watchers diet with some nice results Impaired glucose tolerance Hypothyroidism  we'll continue present dose of Synthroid Colonic polyps  (followup colonoscopy performed in 2014) H/O melanoma. Close followup dermatology

## 2013-07-11 NOTE — Patient Instructions (Signed)
Limit your sodium (Salt) intake    It is important that you exercise regularly, at least 20 minutes 3 to 4 times per week.  If you develop chest pain or shortness of breath seek  medical attention.  Please check your blood pressure on a regular basis.  If it is consistently greater than 150/90, please make an office appointment.  Return in 6 months for follow-up  Take a calcium supplement, plus 800-1200 units of vitamin D 

## 2013-07-12 ENCOUNTER — Telehealth: Payer: Self-pay | Admitting: Internal Medicine

## 2013-07-12 NOTE — Telephone Encounter (Signed)
Relevant patient education assigned to patient using Emmi. ° °

## 2013-08-01 ENCOUNTER — Telehealth: Payer: Self-pay | Admitting: Internal Medicine

## 2013-08-01 NOTE — Telephone Encounter (Signed)
Pt request albuteral inhaler, Respinal, (Ipratropium)  Could not find on med list. °[facility] Mail order 90 days °

## 2013-08-02 MED ORDER — IPRATROPIUM-ALBUTEROL 20-100 MCG/ACT IN AERS: 1.0000 | INHALATION_SPRAY | Freq: Four times a day (QID) | RESPIRATORY_TRACT | Status: DC

## 2013-08-02 MED ORDER — IPRATROPIUM-ALBUTEROL 20-100 MCG/ACT IN AERS: 1.0000 | INHALATION_SPRAY | Freq: Four times a day (QID) | RESPIRATORY_TRACT | Status: DC | PRN

## 2013-08-02 NOTE — Telephone Encounter (Signed)
Pt called back, asked her what she is taking? She said Combivent Inhaler. Told her okay Rx sent to [facility]. Pt verbalized understanding.

## 2013-08-02 NOTE — Telephone Encounter (Signed)
Left message on voicemail to call office.  

## 2013-08-09 ENCOUNTER — Telehealth: Payer: Self-pay | Admitting: Internal Medicine

## 2013-08-09 DIAGNOSIS — M25512 Pain in left shoulder: Secondary | ICD-10-CM

## 2013-08-09 NOTE — Telephone Encounter (Signed)
Pt call req a referral to see Dr Stacie Gl[facility] // fax number; [phone]

## 2013-08-09 NOTE — Telephone Encounter (Signed)
Left message on voicemail to call office.  

## 2013-08-09 NOTE — Telephone Encounter (Signed)
Spoke to pt asked her why she is seeing Ortho. Pt stated she is having Left shoulder pain and she was seen there for her right shoulder surgery in the past. Told pt okay will send order for referral and our referral person will take care of it. Pt verbalized understanding.

## 2013-09-14 ENCOUNTER — Other Ambulatory Visit: Payer: Self-pay | Admitting: Internal Medicine

## 2013-09-16 ENCOUNTER — Telehealth: Payer: Self-pay | Admitting: Internal Medicine

## 2013-09-16 DIAGNOSIS — C439 Malignant melanoma of skin, unspecified: Secondary | ICD-10-CM

## 2013-09-16 NOTE — Telephone Encounter (Signed)
Pt has appt on 09-27-13 with dr Sydnee Levans [phone] fax [phone] pt had melanoma. Pt is going for completed skin cheek. Pt has humana gold. Pt has seen this dermatologist in past

## 2013-09-26 NOTE — Telephone Encounter (Signed)
Pt called about her referral to the dermatologist she has an appt on 09/27/13 and need the referral asap

## 2013-09-26 NOTE — Telephone Encounter (Signed)
Left detailed message order for referral to Dermatology done and the referral coordinator will send referral over to office. Any questions call office.

## 2013-09-26 NOTE — Telephone Encounter (Signed)
Order for referral for Dermatology done.

## 2013-10-25 ENCOUNTER — Other Ambulatory Visit: Payer: Self-pay | Admitting: Internal Medicine

## 2013-10-26 ENCOUNTER — Other Ambulatory Visit: Payer: Self-pay | Admitting: Obstetrics and Gynecology

## 2013-10-27 LAB — CYTOLOGY - PAP

## 2013-10-28 ENCOUNTER — Other Ambulatory Visit: Payer: Self-pay | Admitting: Internal Medicine

## 2013-12-09 ENCOUNTER — Telehealth: Payer: Self-pay | Admitting: Internal Medicine

## 2013-12-09 NOTE — Telephone Encounter (Signed)
Submitted a referral for patient to see Dr Theda Sers -12-27-2013 Authorization # [id number] start 12/27/2013 - 06/27/2014 [place]: Jonn Shingles MD  Address: [address] [place], [place], [place] [address]  [phone]

## 2013-12-19 ENCOUNTER — Ambulatory Visit (INDEPENDENT_AMBULATORY_CARE_PROVIDER_SITE_OTHER): Payer: Commercial Managed Care - HMO | Admitting: Physician Assistant

## 2013-12-19 ENCOUNTER — Encounter: Payer: Self-pay | Admitting: Physician Assistant

## 2013-12-19 VITALS — BP 140/80 | HR 62 | Temp 98.4°F | Resp 18 | Wt 170.1 lb

## 2013-12-19 DIAGNOSIS — J45901 Unspecified asthma with (acute) exacerbation: Secondary | ICD-10-CM

## 2013-12-19 MED ORDER — ALBUTEROL SULFATE (2.5 MG/3ML) 0.083% IN NEBU: 2.5000 mg | INHALATION_SOLUTION | RESPIRATORY_TRACT | Status: DC

## 2013-12-19 MED ORDER — METHYLPREDNISOLONE ACETATE 40 MG/ML IJ SUSP: 40.0000 mg | Freq: Once | INTRAMUSCULAR | Status: DC

## 2013-12-19 NOTE — Patient Instructions (Addendum)
Plain Over the Counter Mucinex (NOT Mucinex D) for thick secretions  Force NON dairy fluids, drinking plenty of water is best.    Over the Counter Flonase OR Nasacort AQ 1 spray in each nostril twice a day as needed. Use the "crossover" technique into opposite nostril spraying toward opposite ear @ 45 degree angle, not straight up into nostril.   Plain Over the Counter Allegra (NOT D )  160 daily , OR Loratidine 10 mg , OR Zyrtec 10 mg @ bedtime  as needed for itchy eyes & sneezing.  Saline Irrigation and Saline Sprays can also help reduce symptoms.  If emergency symptoms discussed during visit developed, seek medical attention immediately.  Followup as needed, or for worsening or persistent symptoms despite treatment.     Asthma, Acute Bronchospasm Acute bronchospasm caused by asthma is also referred to as an asthma attack. Bronchospasm means your air passages become narrowed. The narrowing is caused by inflammation and tightening of the muscles in the air tubes (bronchi) in your lungs. This can make it hard to breathe or cause you to wheeze and cough. CAUSES Possible triggers are:  Animal dander from the skin, hair, or feathers of animals.  Dust mites contained in house dust.  Cockroaches.  Pollen from trees or grass.  Mold.  Cigarette or tobacco smoke.  Air pollutants such as dust, household cleaners, hair sprays, aerosol sprays, paint fumes, strong chemicals, or strong odors.  Cold air or weather changes. Cold air may trigger inflammation. Winds increase molds and pollens in the air.  Strong emotions such as crying or laughing hard.  Stress.  Certain medicines such as aspirin or beta-blockers.  Sulfites in foods and drinks, such as dried fruits and wine.  Infections or inflammatory conditions, such as a flu, cold, or inflammation of the nasal membranes (rhinitis).  Gastroesophageal reflux disease (GERD). GERD is a condition where stomach acid backs up into your  esophagus.  Exercise or strenuous activity. SIGNS AND SYMPTOMS   Wheezing.  Excessive coughing, particularly at night.  Chest tightness.  Shortness of breath. DIAGNOSIS  Your health care provider will ask you about your medical history and perform a physical exam. A chest X-ray or blood testing may be performed to look for other causes of your symptoms or other conditions that may have triggered your asthma attack. TREATMENT  Treatment is aimed at reducing inflammation and opening up the airways in your lungs. Most asthma attacks are treated with inhaled medicines. These include quick relief or rescue medicines (such as bronchodilators) and controller medicines (such as inhaled corticosteroids). These medicines are sometimes given through an inhaler or a nebulizer. Systemic steroid medicine taken by mouth or given through an IV tube also can be used to reduce the inflammation when an attack is moderate or severe. Antibiotic medicines are only used if a bacterial infection is present.  HOME CARE INSTRUCTIONS   Rest.  Drink plenty of liquids. This helps the mucus to remain thin and be easily coughed up. Only use caffeine in moderation and do not use alcohol until you have recovered from your illness.  Do not smoke. Avoid being exposed to secondhand smoke.  You play a critical role in keeping yourself in good health. Avoid exposure to things that cause you to wheeze or to have breathing problems.  Keep your medicines up-to-date and available. Carefully follow your health care provider's treatment plan.  Take your medicine exactly as prescribed.  When pollen or pollution is bad, keep windows closed and  use an air conditioner or go to places with air conditioning.  Asthma requires careful medical care. See your health care provider for a follow-up as advised. If you are more than [redacted] weeks pregnant and you were prescribed any new medicines, let your obstetrician know about the visit and  how you are doing. Follow up with your health care provider as directed.  After you have recovered from your asthma attack, make an appointment with your outpatient doctor to talk about ways to reduce the likelihood of future attacks. If you do not have a doctor who manages your asthma, make an appointment with a primary care doctor to discuss your asthma. SEEK IMMEDIATE MEDICAL CARE IF:   You are getting worse.  You have trouble breathing. If severe, call your local emergency services (911 in the [place]  You develop chest pain or discomfort.  You are vomiting.  You are not able to keep fluids down.  You are coughing up yellow, green, brown, or bloody sputum.  You have a fever and your symptoms suddenly get worse.  You have trouble swallowing. MAKE SURE YOU:   Understand these instructions.  Will watch your condition.  Will get help right away if you are not doing well or get worse. Document Released: 07/23/2006 Document Revised: 04/12/2013 Document Reviewed: 10/13/2012 [facility] Patient Information 2015 [place], [place]. This information is not intended to replace advice given to you by your health care provider. Make sure you discuss any questions you have with your health care provider.

## 2013-12-19 NOTE — Progress Notes (Signed)
Subjective:    Patient ID: Jasmine Wheeler, female    DOB: 13-Aug-1945, 68 y.o.   MRN: [id number]  Cough This is a new problem. The current episode started in the past 7 days (4 days). The problem has been gradually worsening. The problem occurs every few minutes. The cough is productive of sputum. Associated symptoms include headaches, myalgias, nasal congestion, postnasal drip, rhinorrhea and wheezing (has asthma). Pertinent negatives include no chest pain, chills, ear congestion, ear pain, fever, heartburn, hemoptysis, rash, sore throat, shortness of breath, sweats or weight loss. The symptoms are aggravated by lying down. She has tried a beta-agonist inhaler (ibuprofen, zyrtec, mucinex, and symbicort) for the symptoms. The treatment provided mild relief. Her past medical history is significant for asthma and environmental allergies. There is no history of COPD.      Review of Systems  Constitutional: Negative for fever, chills and weight loss.  HENT: Positive for postnasal drip and rhinorrhea. Negative for ear pain and sore throat.   Respiratory: Positive for cough and wheezing (has asthma). Negative for hemoptysis and shortness of breath.   Cardiovascular: Negative for chest pain.  Gastrointestinal: Negative for heartburn, nausea, vomiting and diarrhea.  Musculoskeletal: Positive for myalgias.  Skin: Negative for rash.  Allergic/Immunologic: Positive for environmental allergies.  Neurological: Positive for headaches. Negative for syncope.  All other systems reviewed and are negative.    Past Medical History  Diagnosis Date  . Hypertension   . Asthma   . Adenomatous colon polyp   . Hypothyroidism   . Chronic low back pain   . Diverticulosis 01/23/2010    left colon    History   Social History  . Marital Status: Married    Spouse Name: N/A    Number of Children: 3  . 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  Smoking status: Never Smoker   . Smokeless tobacco: Never Used  . Alcohol Use: No  . Drug Use: No  . Sexual Activity: Not on file   Other Topics Concern  . Not on file   Social History Narrative  . No narrative on file    Past Surgical History  Procedure Laterality Date  . Hemicolectomy  02/15/10    right, tubulovillous adenoma appendix, Dr. Georgette Dover  . Abdominal hysterectomy    . Repeat cesarean section      3 in all, last 1973  . Lumbar epidural injection  2011    x 2  . Colonoscopy w/ biopsies  multiple  . Hemorrhoid banding  2014    Family History  Problem Relation Age of Onset  . Lung cancer Father   . Heart disease Father   . Colon cancer Mother   . Colon cancer Maternal Uncle   . Colon cancer Maternal Uncle     Allergies  Allergen Reactions  . Nifedipine     REACTION: unspecified  . Theophylline     REACTION: unspecified    Current Outpatient Prescriptions on File Prior to Visit  Medication Sig Dispense Refill  . Ascorbic Acid (VITAMIN C) 500 MG tablet daily. Take 2 tablet daily       . aspirin 81 MG EC tablet Take 81 mg by mouth daily.        . Calcium Carbonate-Vitamin D (CALCIUM-VITAMIN D) 500-200 MG-UNIT per tablet Take 1 tablet by mouth 2 (two) times daily with a meal.        .  Cranberry 1000 MG CAPS Take by mouth. Take 2 tablets daily       . diltiazem (TIAZAC) 360 MG 24 hr capsule TAKE 1 CAPSULE EVERY DAY  90 capsule  0  . fish oil-omega-3 fatty acids 1000 MG capsule Take 2 g by mouth 2 (two) times daily.        Marland Kitchen glucosamine-chondroitin 500-400 MG tablet Take 2 tablets by mouth daily.       . Ipratropium-Albuterol (COMBIVENT) 20-100 MCG/ACT AERS respimat Inhale 1 puff into the lungs every 6 (six) hours as needed for wheezing.  3 Inhaler  1  . levothyroxine (SYNTHROID, LEVOTHROID) 175 MCG tablet TAKE 1 TABLET EVERY DAY BEFORE BREAKFAST  90 tablet  1  . loperamide (IMODIUM A-D) 2 MG tablet Take 1 tablet (2 mg total) by mouth 4 (four) times daily as  needed for diarrhea/loose stools. You may find that taking 1 or 2 every AM will help control the loose stools.  30 tablet  0  . metoprolol succinate (TOPROL-XL) 50 MG 24 hr tablet TAKE 1 TABLET TWICE DAILY WITH OR IMMEDIATELY FOLLOWING MEALS  180 tablet  0  . Multiple Vitamin (MULTIVITAMIN) tablet Take 1 tablet by mouth daily.        . Probiotic Product ([place]) Take by mouth daily.      . quinapril (ACCUPRIL) 40 MG tablet TAKE 1 TABLET TWICE DAILY  180 tablet  1  . spironolactone (ALDACTONE) 25 MG tablet TAKE 1 TABLET EVERY DAY  90 tablet  1  . vitamin B-12 (CYANOCOBALAMIN) 500 MCG tablet Take 500 mcg by mouth daily.        . [DISCONTINUED] diltiazem (CARDIZEM CD) 360 MG 24 hr capsule Take 1 capsule (360 mg total) by mouth daily.  90 capsule  3   No current facility-administered medications on file prior to visit.    EXAM: BP 140/80  Pulse 62  Temp(Src) 98.4 F (36.9 C) (Oral)  Resp 18  Wt 170 lb 1.6 oz (77.157 kg)  SpO2 96%     Objective:   Physical Exam  Nursing note and vitals reviewed. Constitutional: She is oriented to person, place, and time. She appears well-developed and well-nourished. No distress.  HENT:  Head: Normocephalic and atraumatic.  Right Ear: External ear normal.  Left Ear: External ear normal.  Nose: Nose normal.  Mouth/Throat: Oropharynx is clear and moist. No oropharyngeal exudate.  Bilateral TMs normal. Bilateral frontal and maxillary sinuses non-TTP.   Eyes: Conjunctivae and EOM are normal. Pupils are equal, round, and reactive to light.  Neck: Normal range of motion. Neck supple.  Cardiovascular: Normal rate, regular rhythm and intact distal pulses.   Pulmonary/Chest: Effort normal. No stridor. No respiratory distress. She has wheezes. She has no rales. She exhibits no tenderness.  Lymphadenopathy:    She has no cervical adenopathy.  Neurological: She is alert and oriented to person, place, and time.  Skin: Skin is warm and dry.  She is not diaphoretic.  Psychiatric: She has a normal mood and affect. Her behavior is normal. Judgment and thought content normal.    Lab Results  Component Value Date   WBC 4.6 07/05/2013   HGB 12.0 07/05/2013   HCT 35.5* 07/05/2013   PLT 208.0 07/05/2013   GLUCOSE 110* 07/05/2013   CHOL 201* 07/05/2013   TRIG 102.0 07/05/2013   HDL 49.10 07/05/2013   LDLCALC 132* 07/05/2013   ALT 11 07/05/2013   AST 11 07/05/2013   NA 139 07/05/2013  K 3.8 07/05/2013   CL 102 07/05/2013   CREATININE 1.0 07/05/2013   BUN 25* 07/05/2013   CO2 30 07/05/2013   TSH 2.02 07/05/2013   INR 1.06 04/26/2010        Assessment & Plan:  Hilaria was seen today for cough.  Diagnoses and associated orders for this visit:  Asthmatic bronchitis, unspecified asthma severity, with acute exacerbation Comments: Neb in office, depo-medrol IM. Push fluids, rest, otc mucinex, Robitussin-DM, continue zyrtec. - methylPREDNISolone acetate (DEPO-MEDROL) injection 40 mg; Inject 1 mL (40 mg total) into the muscle once. - albuterol (PROVENTIL) (2.5 MG/3ML) 0.083% nebulizer solution 2.5 mg; Take 3 mLs (2.5 mg total) by nebulization now.    Spoke with Dr. Burnice Logan about the patient as patient is having an acute bronchospasm from her asthma, however has a surgery next week and may not be able to do a full course of prednisone. I agree with him that we can instead provide 40 mg Depo-Medrol injection to help the wheezing symptoms, as well as in office neb treatment. Patient is amenable to this, and will followup for any persistent symptoms or worsening.  Return precautions provided, and patient handout on asthma acute bronchospasm.  Plan to follow up as needed, or for worsening or persistent symptoms despite treatment.  Patient Instructions  Plain Over the Counter Mucinex (NOT Mucinex D) for thick secretions  Force NON dairy fluids, drinking plenty of water is best.    Over the Counter Flonase OR Nasacort AQ 1 spray in each  nostril twice a day as needed. Use the "crossover" technique into opposite nostril spraying toward opposite ear @ 45 degree angle, not straight up into nostril.   Plain Over the Counter Allegra (NOT D )  160 daily , OR Loratidine 10 mg , OR Zyrtec 10 mg @ bedtime  as needed for itchy eyes & sneezing.  Saline Irrigation and Saline Sprays can also help reduce symptoms.  If emergency symptoms discussed during visit developed, seek medical attention immediately.  Followup as needed, or for worsening or persistent symptoms despite treatment.

## 2013-12-22 ENCOUNTER — Encounter: Payer: Self-pay | Admitting: Internal Medicine

## 2013-12-22 ENCOUNTER — Ambulatory Visit (INDEPENDENT_AMBULATORY_CARE_PROVIDER_SITE_OTHER): Payer: Commercial Managed Care - HMO | Admitting: Internal Medicine

## 2013-12-22 VITALS — BP 146/80 | HR 72 | Temp 98.0°F | Resp 20 | Ht 62.5 in | Wt 171.0 lb

## 2013-12-22 DIAGNOSIS — I10 Essential (primary) hypertension: Secondary | ICD-10-CM

## 2013-12-22 DIAGNOSIS — J45909 Unspecified asthma, uncomplicated: Secondary | ICD-10-CM

## 2013-12-22 MED ORDER — ALBUTEROL SULFATE HFA 108 (90 BASE) MCG/ACT IN AERS: 2.0000 | INHALATION_SPRAY | Freq: Four times a day (QID) | RESPIRATORY_TRACT | Status: DC | PRN

## 2013-12-22 NOTE — Progress Notes (Signed)
Pre visit review using our clinic review tool, if applicable. No additional management support is needed unless otherwise documented below in the visit note. 

## 2013-12-22 NOTE — Progress Notes (Signed)
Subjective:    Patient ID: Jasmine Wheeler, female    DOB: 10-22-1945, 68 y.o.   MRN: [id number]  HPI  68 year old patient who developed increased sinus and chest congestion.  One week ago.  She was seen 3 days ago with increasing chest congestion, wheezing, and shortness of breath.  She was treated with Depo-Medrol and a nebulizer treatment.  She generally is improved with no wheezing.  She continues to have some sinus and chest congestion and minimal cough.  Cough is nonproductive She is anticipating shoulder surgery in 5 days.  Treatment included Depo-Medrol 3 days ago.  Past Medical History  Diagnosis Date  . Hypertension   . Asthma   . Adenomatous colon polyp   . Hypothyroidism   . Chronic low back pain   . Diverticulosis 01/23/2010    left colon    History   Social History  . Marital Status: Married    Spouse Name: N/A    Number of Children: 3  . 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 Smoking status: Never Smoker   . Smokeless tobacco: Never Used  . Alcohol Use: No  . Drug Use: No  . Sexual Activity: Not on file   Other Topics Concern  . Not on file   Social History Narrative  . No narrative on file    Past Surgical History  Procedure Laterality Date  . Hemicolectomy  02/15/10    right, tubulovillous adenoma appendix, Dr. Georgette Dover  . Abdominal hysterectomy    . Repeat cesarean section      3 in all, last 1973  . Lumbar epidural injection  2011    x 2  . Colonoscopy w/ biopsies  multiple  . Hemorrhoid banding  2014    Family History  Problem Relation Age of Onset  . Lung cancer Father   . Heart disease Father   . Colon cancer Mother   . Colon cancer Maternal Uncle   . Colon cancer Maternal Uncle     Allergies  Allergen Reactions  . Nifedipine     REACTION: unspecified  . Theophylline     REACTION: unspecified    Current Outpatient Prescriptions on File Prior to Visit  Medication Sig  Dispense Refill  . Ascorbic Acid (VITAMIN C) 500 MG tablet daily. Take 2 tablet daily       . aspirin 81 MG EC tablet Take 81 mg by mouth daily.        . Calcium Carbonate-Vitamin D (CALCIUM-VITAMIN D) 500-200 MG-UNIT per tablet Take 1 tablet by mouth 2 (two) times daily with a meal.        . Cranberry 1000 MG CAPS Take by mouth. Take 2 tablets daily       . diltiazem (TIAZAC) 360 MG 24 hr capsule TAKE 1 CAPSULE EVERY DAY  90 capsule  0  . fish oil-omega-3 fatty acids 1000 MG capsule Take 2 g by mouth 2 (two) times daily.        Marland Kitchen glucosamine-chondroitin 500-400 MG tablet Take 2 tablets by mouth daily.       . Ipratropium-Albuterol (COMBIVENT) 20-100 MCG/ACT AERS respimat Inhale 1 puff into the lungs every 6 (six) hours as needed for wheezing.  3 Inhaler  1  . levothyroxine (SYNTHROID, LEVOTHROID) 175 MCG tablet TAKE 1 TABLET EVERY DAY BEFORE BREAKFAST  90 tablet  1  . loperamide (IMODIUM A-D) 2 MG tablet Take 1  tablet (2 mg total) by mouth 4 (four) times daily as needed for diarrhea/loose stools. You may find that taking 1 or 2 every AM will help control the loose stools.  30 tablet  0  . metoprolol succinate (TOPROL-XL) 50 MG 24 hr tablet TAKE 1 TABLET TWICE DAILY WITH OR IMMEDIATELY FOLLOWING MEALS  180 tablet  0  . Multiple Vitamin (MULTIVITAMIN) tablet Take 1 tablet by mouth daily.        . Probiotic Product ([place]) Take by mouth daily.      . quinapril (ACCUPRIL) 40 MG tablet TAKE 1 TABLET TWICE DAILY  180 tablet  1  . spironolactone (ALDACTONE) 25 MG tablet TAKE 1 TABLET EVERY DAY  90 tablet  1  . vitamin B-12 (CYANOCOBALAMIN) 500 MCG tablet Take 500 mcg by mouth daily.        . [DISCONTINUED] diltiazem (CARDIZEM CD) 360 MG 24 hr capsule Take 1 capsule (360 mg total) by mouth daily.  90 capsule  3   No current facility-administered medications on file prior to visit.    BP 146/80  Pulse 72  Temp(Src) 98 F (36.7 C) (Oral)  Resp 20  Ht 5' 2.5" (1.588 m)  Wt 171 lb  (77.565 kg)  BMI 30.76 kg/m2  SpO2 95%     Review of Systems  Constitutional: Positive for activity change, appetite change and fatigue.  HENT: Positive for congestion. Negative for dental problem, hearing loss, rhinorrhea, sinus pressure, sore throat and tinnitus.   Eyes: Negative for pain, discharge and visual disturbance.  Respiratory: Positive for cough. Negative for shortness of breath. Wheezing: Resolved.   Cardiovascular: Negative for chest pain, palpitations and leg swelling.  Gastrointestinal: Negative for nausea, vomiting, abdominal pain, diarrhea, constipation, blood in stool and abdominal distention.  Genitourinary: Negative for dysuria, urgency, frequency, hematuria, flank pain, vaginal bleeding, vaginal discharge, difficulty urinating, vaginal pain and pelvic pain.  Musculoskeletal: Negative for arthralgias, gait problem and joint swelling.  Skin: Negative for rash.  Neurological: Negative for dizziness, syncope, speech difficulty, weakness, numbness and headaches.  Hematological: Negative for adenopathy.  Psychiatric/Behavioral: Negative for behavioral problems, dysphoric mood and agitation. The patient is not nervous/anxious.        Objective:   Physical Exam  Constitutional: She is oriented to person, place, and time. She appears well-developed and well-nourished.  HENT:  Head: Normocephalic.  Right Ear: External ear normal.  Left Ear: External ear normal.  Mouth/Throat: Oropharynx is clear and moist.  Eyes: Conjunctivae and EOM are normal. Pupils are equal, round, and reactive to light.  Neck: Normal range of motion. Neck supple. No thyromegaly present.  Cardiovascular: Normal rate, regular rhythm, normal heart sounds and intact distal pulses.   Pulmonary/Chest: Effort normal and breath sounds normal. She has no wheezes.  Abdominal: Soft. Bowel sounds are normal. She exhibits no mass. There is no tenderness.  Musculoskeletal: Normal range of motion.    Lymphadenopathy:    She has no cervical adenopathy.  Neurological: She is alert and oriented to person, place, and time.  Skin: Skin is warm and dry. No rash noted.  Psychiatric: She has a normal mood and affect. Her behavior is normal.          Assessment & Plan:   Resolving asthmatic bronchitis.  We'll continue expectorants.  The patient has already received 3 days ago.  Depo-Medrol.  We'll continue albuterol 4 times daily Hypertension well controlled

## 2013-12-22 NOTE — Patient Instructions (Signed)
Take over-the-counter expectorants and cough medications such as  Mucinex DM.  Call if there is no improvement in 5 to 7 days or if  you develop worsening cough, fever, or new symptoms, such as shortness of breath or chest pain.  Use albuterol 3 times daily  Drink as much fluid as you  can tolerate over the next few days

## 2014-01-11 ENCOUNTER — Ambulatory Visit: Payer: Medicare HMO | Admitting: Internal Medicine

## 2014-01-13 ENCOUNTER — Ambulatory Visit: Payer: Commercial Managed Care - HMO

## 2014-02-06 ENCOUNTER — Telehealth: Payer: Self-pay | Admitting: Internal Medicine

## 2014-02-06 NOTE — Telephone Encounter (Signed)
Flu shot documented

## 2014-02-06 NOTE — Telephone Encounter (Signed)
Pt had flu shot on 9/25 and it has not been entered into system yet. Can you put this in? Pt keps getting calls from [facility] to get [facility] inj.

## 2014-04-09 ENCOUNTER — Ambulatory Visit (HOSPITAL_COMMUNITY): Payer: Commercial Managed Care - HMO | Attending: Emergency Medicine

## 2014-04-09 ENCOUNTER — Encounter (HOSPITAL_COMMUNITY): Payer: Self-pay | Admitting: Emergency Medicine

## 2014-04-09 ENCOUNTER — Emergency Department (HOSPITAL_COMMUNITY)
Admission: EM | Admit: 2014-04-09 | Discharge: 2014-04-09 | Disposition: A | Discharge status: Home / Self Care | Payer: Medicare HMO | Source: Home / Self Care | Attending: Emergency Medicine | Admitting: Emergency Medicine

## 2014-04-09 DIAGNOSIS — J189 Pneumonia, unspecified organism: Secondary | ICD-10-CM

## 2014-04-09 DIAGNOSIS — J029 Acute pharyngitis, unspecified: Secondary | ICD-10-CM | POA: Diagnosis not present

## 2014-04-09 DIAGNOSIS — J4520 Mild intermittent asthma, uncomplicated: Secondary | ICD-10-CM

## 2014-04-09 DIAGNOSIS — R05 Cough: Secondary | ICD-10-CM | POA: Diagnosis present

## 2014-04-09 DIAGNOSIS — R059 Cough, unspecified: Secondary | ICD-10-CM

## 2014-04-09 MED ORDER — DOXYCYCLINE HYCLATE 100 MG PO TABS: 100.0000 mg | ORAL_TABLET | Freq: Two times a day (BID) | ORAL | Status: DC

## 2014-04-09 MED ORDER — PREDNISONE 20 MG PO TABS: ORAL_TABLET | ORAL | Status: DC

## 2014-04-09 MED ORDER — IPRATROPIUM-ALBUTEROL 0.5-2.5 (3) MG/3ML IN SOLN
3.0000 mL | Freq: Once | RESPIRATORY_TRACT | Status: AC
  Administered 2014-04-09: 3 mL via RESPIRATORY_TRACT

## 2014-04-09 MED ORDER — BENZONATATE 200 MG PO CAPS: 200.0000 mg | ORAL_CAPSULE | Freq: Three times a day (TID) | ORAL | Status: DC | PRN

## 2014-04-09 MED ORDER — METHYLPREDNISOLONE ACETATE 80 MG/ML IJ SUSP
INTRAMUSCULAR | Status: AC
  Filled 2014-04-09: qty 1

## 2014-04-09 MED ORDER — IPRATROPIUM-ALBUTEROL 0.5-2.5 (3) MG/3ML IN SOLN
RESPIRATORY_TRACT | Status: AC
  Filled 2014-04-09: qty 3

## 2014-04-09 MED ORDER — METHYLPREDNISOLONE ACETATE 80 MG/ML IJ SUSP
80.0000 mg | Freq: Once | INTRAMUSCULAR | Status: AC
  Administered 2014-04-09: 80 mg via INTRAMUSCULAR

## 2014-04-09 NOTE — ED Notes (Addendum)
Patient c/o cough and congestion x 5 days ago. Productive cough with thick green mucous. Reports SOB and having to use her inhaler, and sleep upright in her recliner. Patient reports she had asthmatic bronchitis in August. Patient is in NAD.

## 2014-04-09 NOTE — Discharge Instructions (Signed)

## 2014-04-09 NOTE — ED Provider Notes (Signed)
Chief Complaint   Cough   History of Present Illness   Jasmine Wheeler is a 68 year old female who presents with a six-day history of chills, temperature to 99.7, sweats, and generalized myalgias. She's had a cough productive green sputum, and chest tightness. She denies any chest pain. She's had nasal congestion with green drainage, and facial pressure. She denies any ear congestion or eye redness or discharge. She's had sore throat, pain in her neck, anorexia. No other GI symptoms.  Review of Systems   Other than as noted above, the patient denies any of the following symptoms: Systemic:  No fevers, chills, sweats, or myalgias. Eye:  No redness or discharge. ENT:  No ear pain, headache, nasal congestion, drainage, sinus pressure, or sore throat. Neck:  No neck pain, stiffness, or swollen glands. Lungs:  No cough, sputum production, hemoptysis, wheezing, chest tightness, shortness of breath or chest pain. GI:  No abdominal pain, nausea, vomiting or diarrhea.  [place]   Past medical history, family history, social history, meds, and allergies were reviewed. No medication allergies. She has a history of high blood pressure, asthma, and hypothyroidism. She takes albuterol, diltiazem, and Synthroid.  Physical exam   Vital signs:  BP 117/62 mmHg  Pulse 64  Temp(Src) 98.1 F (36.7 C) (Oral)  SpO2 94% General:  Alert and oriented.  In no distress.  Skin warm and dry. Eye:  No conjunctival injection or drainage. Lids were normal. ENT:  TMs and canals were normal, without erythema or inflammation.  Nasal mucosa was clear and uncongested, without drainage.  Mucous membranes were moist.  Pharynx was clear with no exudate or drainage.  There were no oral ulcerations or lesions. Neck:  Supple, no adenopathy, tenderness or mass. Lungs:  No respiratory distress.  There are scattered bilateral expiratory wheezes posteriorly, no rales or rhonchi, good air movement bilaterally.  Heart:   Regular rhythm, without gallops, murmers or rubs. Skin:  Clear, warm, and dry, without rash or lesions.  Radiology   Dg Chest 2 View  04/09/2014   CLINICAL DATA:  Cough, congestion, sore throat.  EXAM: CHEST  2 VIEW  COMPARISON:  None.  FINDINGS: Normal cardiac and mediastinal contours. Minimal heterogeneous right infrahilar opacities. No pleural effusion or pneumothorax. Probable right upper lung calcified granuloma. Mid thoracic spine degenerative change.  IMPRESSION: Minimal heterogeneous opacities right infrahilar region, potentially representing atelectasis and or infection in the appropriate clinical setting. Recommend short-term followup radiograph to assess for interval resolution.  Probable 5 mm right upper lung calcified granuloma.   Electronically Signed   By: Lovey Newcomer M.D.   On: 04/09/2014 14:40     Course in [facility]   The following medications were given:  Medications  ipratropium-albuterol (DUONEB) 0.5-2.5 (3) MG/3ML nebulizer solution 3 mL (3 mLs Nebulization Given 04/09/14 1321)  methylPREDNISolone acetate (DEPO-MEDROL) injection 80 mg (80 mg Intramuscular Given 04/09/14 1503)    Following her breathing treatment, she felt better, and her lungs were nearly clear with only a few wheezes and improved air movement.  Assessment     The primary encounter diagnosis was Community acquired pneumonia. Diagnoses of Cough and Asthmatic bronchitis, mild intermittent, uncomplicated were also pertinent to this visit.  Plan    1.  Meds:  The following meds were prescribed:   Discharge Medication List as of 04/09/2014  2:51 PM    START taking these medications   Details  benzonatate (TESSALON) 200 MG capsule Take 1 capsule (200 mg total) by  mouth 3 (three) times daily as needed for cough., Starting 04/09/2014, Until Discontinued, Normal    doxycycline (VIBRA-TABS) 100 MG tablet Take 1 tablet (100 mg total) by mouth 2 (two) times daily., Starting 04/09/2014, Until  Discontinued, Normal    predniSONE (DELTASONE) 20 MG tablet Take 3 daily for 5 days, 2 daily for 5 days, 1 daily for 5 days., Normal        2.  Patient Education/Counseling:  The patient was given appropriate handouts, self care instructions, and instructed in symptomatic relief.  Instructed to get extra fluids and extra rest.    3.  Follow up:  The patient was told to follow up with her primary care physician in 10 days, or sooner if becoming worse in any way, and given some red flag symptoms such as increasing fever, difficulty breathing, chest pain, or persistent vomiting which would prompt immediate return.       Harden Mo, MD 04/09/14 [phone]

## 2014-04-15 ENCOUNTER — Other Ambulatory Visit: Payer: Self-pay | Admitting: Internal Medicine

## 2014-04-17 ENCOUNTER — Other Ambulatory Visit: Payer: Self-pay | Admitting: Internal Medicine

## 2014-04-20 ENCOUNTER — Other Ambulatory Visit: Payer: Self-pay | Admitting: Internal Medicine

## 2014-04-21 DIAGNOSIS — R011 Cardiac murmur, unspecified: Secondary | ICD-10-CM

## 2014-04-21 HISTORY — DX: Cardiac murmur, unspecified: R01.1

## 2014-04-24 ENCOUNTER — Encounter: Payer: Self-pay | Admitting: Internal Medicine

## 2014-04-24 ENCOUNTER — Ambulatory Visit (INDEPENDENT_AMBULATORY_CARE_PROVIDER_SITE_OTHER): Payer: Medicare HMO | Admitting: Internal Medicine

## 2014-04-24 ENCOUNTER — Ambulatory Visit: Payer: Medicare HMO | Admitting: Internal Medicine

## 2014-04-24 VITALS — BP 142/80 | HR 60 | Temp 98.0°F | Resp 20 | Ht 62.25 in | Wt 169.0 lb

## 2014-04-24 DIAGNOSIS — R7302 Impaired glucose tolerance (oral): Secondary | ICD-10-CM

## 2014-04-24 DIAGNOSIS — I10 Essential (primary) hypertension: Secondary | ICD-10-CM

## 2014-04-24 DIAGNOSIS — J45909 Unspecified asthma, uncomplicated: Secondary | ICD-10-CM

## 2014-04-24 MED ORDER — FLUCONAZOLE 200 MG PO TABS: 200.0000 mg | ORAL_TABLET | Freq: Every day | ORAL | Status: DC

## 2014-04-24 NOTE — Patient Instructions (Signed)
Limit your sodium (Salt) intake    It is important that you exercise regularly, at least 20 minutes 3 to 4 times per week.  If you develop chest pain or shortness of breath seek  medical attention.  Return in 6 months for follow-up  

## 2014-04-24 NOTE — Progress Notes (Signed)
Subjective:    Patient ID: Jasmine Wheeler, female    DOB: Aug 27, 1945, 69 y.o.   MRN: [id number]  HPI 69 year old patient who has a history of chronic asthma.  She was seen at the urgent care on December 22 with severe chest congestion, cough and wheezing.  She has completed antibiotic therapy as well as a prednisone Dosepak.  She has improved but remains quite weak.  A chest x-ray was reviewed. Since her last visit here, she has had shoulder surgery and has done quite well with rehabilitation. Her palmar status is now stable, although she remains somewhat weak and deconditioned.  No further cough or wheezing.  Past Medical History  Diagnosis Date  . Hypertension   . Asthma   . Adenomatous colon polyp   . Hypothyroidism   . Chronic low back pain   . Diverticulosis 01/23/2010    left colon    History   Social History  . Marital Status: Married    Spouse Name: N/A    Number of Children: 3  . 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 Smoking status: Never Smoker   . Smokeless tobacco: Never Used  . Alcohol Use: No  . Drug Use: No  . Sexual Activity: Not on file   Other Topics Concern  . Not on file   Social History Narrative    Past Surgical History  Procedure Laterality Date  . Hemicolectomy  02/15/10    right, tubulovillous adenoma appendix, Dr. Georgette Dover  . Abdominal hysterectomy    . Repeat cesarean section      3 in all, last 1973  . Lumbar epidural injection  2011    x 2  . Colonoscopy w/ biopsies  multiple  . Hemorrhoid banding  2014    Family History  Problem Relation Age of Onset  . Lung cancer Father   . Heart disease Father   . Colon cancer Mother   . Colon cancer Maternal Uncle   . Colon cancer Maternal Uncle     Allergies  Allergen Reactions  . Nifedipine     REACTION: unspecified  . Theophylline     REACTION: unspecified    Current Outpatient Prescriptions on File Prior to Visit    Medication Sig Dispense Refill  . albuterol (PROVENTIL HFA;VENTOLIN HFA) 108 (90 BASE) MCG/ACT inhaler Inhale 2 puffs into the lungs every 6 (six) hours as needed for wheezing or shortness of breath. 1 Inhaler 4  . Ascorbic Acid (VITAMIN C) 500 MG tablet daily. Take 2 tablet daily     . aspirin 81 MG EC tablet Take 81 mg by mouth daily.      . benzonatate (TESSALON) 200 MG capsule Take 1 capsule (200 mg total) by mouth 3 (three) times daily as needed for cough. 30 capsule 0  . Calcium Carbonate-Vitamin D (CALCIUM-VITAMIN D) 500-200 MG-UNIT per tablet Take 1 tablet by mouth 2 (two) times daily with a meal.      . cetirizine (ZYRTEC) 10 MG tablet Take 10 mg by mouth daily.    . Cranberry 1000 MG CAPS Take by mouth. Take 2 tablets daily     . diltiazem (TIAZAC) 360 MG 24 hr capsule TAKE 1 CAPSULE EVERY DAY 90 capsule 0  . fish oil-omega-3 fatty acids 1000 MG capsule Take 2 g by mouth 2 (two) times daily.      Marland Kitchen glucosamine-chondroitin 500-400 MG tablet Take 2 tablets  by mouth daily.     . GuaiFENesin (MUCINEX PO) Take 2 tablets by mouth 2 (two) times daily.    . GuaiFENesin (ROBITUSSIN CHEST CONGESTION PO) Take 5 mLs by mouth 2 (two) times daily.    Marland Kitchen levothyroxine (SYNTHROID, LEVOTHROID) 175 MCG tablet TAKE 1 TABLET EVERY DAY BEFORE BREAKFAST 90 tablet 1  . loperamide (IMODIUM A-D) 2 MG tablet Take 1 tablet (2 mg total) by mouth 4 (four) times daily as needed for diarrhea/loose stools. You may find that taking 1 or 2 every AM will help control the loose stools. 30 tablet 0  . metoprolol succinate (TOPROL-XL) 50 MG 24 hr tablet TAKE 1 TABLET TWICE DAILY WITH OR IMMEDIATELY FOLLOWING MEALS 180 tablet 1  . Multiple Vitamin (MULTIVITAMIN) tablet Take 1 tablet by mouth daily.      . Probiotic Product ([place]) Take by mouth daily.    . quinapril (ACCUPRIL) 40 MG tablet TAKE 1 TABLET TWICE DAILY 180 tablet 1  . spironolactone (ALDACTONE) 25 MG tablet TAKE 1 TABLET EVERY DAY 90 tablet 1   . vitamin B-12 (CYANOCOBALAMIN) 500 MCG tablet Take 500 mcg by mouth daily.      . [DISCONTINUED] diltiazem (CARDIZEM CD) 360 MG 24 hr capsule Take 1 capsule (360 mg total) by mouth daily. 90 capsule 3   No current facility-administered medications on file prior to visit.    BP 142/80 mmHg  Pulse 60  Temp(Src) 98 F (36.7 C) (Oral)  Resp 20  Ht 5' 2.25" (1.581 m)  Wt 169 lb (76.658 kg)  BMI 30.67 kg/m2  SpO2 97%      Review of Systems  Constitutional: Positive for fatigue.  HENT: Positive for sore throat. Negative for congestion, dental problem, hearing loss, rhinorrhea, sinus pressure and tinnitus.   Eyes: Negative for pain, discharge and visual disturbance.  Respiratory: Negative for cough and shortness of breath.   Cardiovascular: Negative for chest pain, palpitations and leg swelling.  Gastrointestinal: Negative for nausea, vomiting, abdominal pain, diarrhea, constipation, blood in stool and abdominal distention.  Genitourinary: Negative for dysuria, urgency, frequency, hematuria, flank pain, vaginal bleeding, vaginal discharge, difficulty urinating, vaginal pain and pelvic pain.  Musculoskeletal: Negative for joint swelling, arthralgias and gait problem.  Skin: Negative for rash.  Neurological: Positive for weakness. Negative for dizziness, syncope, speech difficulty, numbness and headaches.  Hematological: Negative for adenopathy.  Psychiatric/Behavioral: Negative for behavioral problems, dysphoric mood and agitation. The patient is not nervous/anxious.        Objective:   Physical Exam  Constitutional: She is oriented to person, place, and time. She appears well-developed and well-nourished.  HENT:  Head: Normocephalic.  Right Ear: External ear normal.  Left Ear: External ear normal.  Mouth/Throat: Oropharynx is clear and moist.  Scattered exudate, mainly involving the buccal mucosa consistent with thrush  Eyes: Conjunctivae and EOM are normal. Pupils are equal,  round, and reactive to light.  Neck: Normal range of motion. Neck supple. No thyromegaly present.  Cardiovascular: Normal rate, regular rhythm, normal heart sounds and intact distal pulses.   Pulmonary/Chest: Effort normal and breath sounds normal.  Abdominal: Soft. Bowel sounds are normal. She exhibits no mass. There is no tenderness.  Musculoskeletal: Normal range of motion.  Lymphadenopathy:    She has no cervical adenopathy.  Neurological: She is alert and oriented to person, place, and time.  Skin: Skin is warm and dry. No rash noted.  Psychiatric: She has a normal mood and affect. Her behavior is normal.  Assessment & Plan:   Resolving asthmatic bronchitis Hypertension, stable Thrush.  Will treat with Diflucan Impaired glucose tolerance  Recheck 6 months

## 2014-04-24 NOTE — Progress Notes (Signed)
Pre visit review using our clinic review tool, if applicable. No additional management support is needed unless otherwise documented below in the visit note. 

## 2014-05-18 ENCOUNTER — Other Ambulatory Visit: Payer: Self-pay | Admitting: Internal Medicine

## 2014-05-18 DIAGNOSIS — Z1231 Encounter for screening mammogram for malignant neoplasm of breast: Secondary | ICD-10-CM

## 2014-06-12 ENCOUNTER — Ambulatory Visit (HOSPITAL_COMMUNITY)
Admission: RE | Admit: 2014-06-12 | Discharge: 2014-06-12 | Disposition: A | Discharge status: Home / Self Care | Payer: Commercial Managed Care - HMO | Source: Ambulatory Visit | Attending: Internal Medicine | Admitting: Internal Medicine

## 2014-06-12 DIAGNOSIS — Z1231 Encounter for screening mammogram for malignant neoplasm of breast: Secondary | ICD-10-CM | POA: Diagnosis not present

## 2014-06-15 ENCOUNTER — Ambulatory Visit (INDEPENDENT_AMBULATORY_CARE_PROVIDER_SITE_OTHER): Payer: Commercial Managed Care - HMO | Admitting: Internal Medicine

## 2014-06-15 ENCOUNTER — Encounter: Payer: Self-pay | Admitting: Internal Medicine

## 2014-06-15 VITALS — BP 152/90 | HR 69 | Temp 98.1°F | Resp 20 | Ht 62.5 in | Wt 178.0 lb

## 2014-06-15 DIAGNOSIS — R600 Localized edema: Secondary | ICD-10-CM | POA: Diagnosis not present

## 2014-06-15 DIAGNOSIS — I10 Essential (primary) hypertension: Secondary | ICD-10-CM | POA: Diagnosis not present

## 2014-06-15 MED ORDER — FUROSEMIDE 40 MG PO TABS: 40.0000 mg | ORAL_TABLET | Freq: Every day | ORAL | Status: DC

## 2014-06-15 MED ORDER — POTASSIUM CHLORIDE CRYS ER 20 MEQ PO TBCR: 20.0000 meq | EXTENDED_RELEASE_TABLET | Freq: Every day | ORAL | Status: DC

## 2014-06-15 NOTE — Patient Instructions (Signed)
Limit your sodium (Salt) intake  Please check your blood pressure on a regular basis.  If it is consistently greater than 150/90, please make an office appointment.  2-D echocardiogram as discussed  Recheck in 2 weeks for follow-up  Cooking with Less Salt Cooking with less salt is one way to reduce the amount of sodium you get from food. Sodium raises blood pressure and causes water to be held in the body. Getting less sodium from food may help lower your blood pressure, reduce any swelling, and protect your heart, liver, and kidneys.  WHAT DO I NEED TO KNOW ABOUT COOKING WITH LESS SALT?  Buy sodium-free or low-sodium products. Look on the label for the words:   Lower-sodium.  Sodium-free.   Sodium-reduced.   No salt added.   Unsalted.  Check the food label before using or buying packaged ingredients.  Look for products with no more than 150 mg of sodium in one serving.  Do not choose foods with salt as one of the first three ingredients on the ingredients list. If salt is one of the first three ingredients, it usually means the item is high in sodium because ingredients are listed in order of amount in the food item.  Use herbs, seasonings without salt, and spices as substitutes for salt in foods.  Use sodium-free baking soda when baking. WHAT ARE SOME SALT ALTERNATIVES? The following are herbs, seasonings, and spices that can be used instead of salt to give taste to your food. Next to their names are foods they can be used to flavor.  Herbs  Bay-Soups, meat and vegetable dishes, and spaghetti sauce.   Basil-Italian dishes, soups, pasta, and fish dishes.   Cilantro-Meat, poultry, and vegetable dishes.   Chili Powder-Marinades and Mexican dishes.   Chives-Salad dressings and potato dishes.   Cumin-Mexican dishes, couscous, and meat dishes.   Dill-Fish dishes, sauces, and salads.   Fennel-Meat and vegetable dishes, breads, and cookies.   Garlic (do  not use garlic salt)-Italian dishes, meat dishes, salad dressings, and sauces.   Marjoram-Soups, potato dishes, and meat dishes.   Oregano-Pizza and spaghetti sauce.   Parsley-Salads, soups, pasta, and meat dishes.   Rosemary-Italian dishes, salad dressings, soups, and red meats.   Saffron-Fish dishes, pasta, and some poultry dishes.   Sage-Stuffings and sauces.   Tarragon-Fish and [employer].   Thyme-Stuffing, meat, and fish dishes.  Herbs should be fresh or dried. Do not choose packaged mixes.  Seasonings  Lemon juice-Fish dishes, poultry dishes, vegetables, and salads.   Vinegar-Salad dressings, vegetables, and fish dishes.  Spices  Cinnamon-Sweet dishes (such as cakes, cookies, and puddings).   Cloves-Gingerbread, puddings, and marinades for meats.   Curry-Vegetable dishes, fish and poultry dishes, and stir-fry dishes.   Ginger-Vegetables dishes, fish dishes, and stir-fry dishes.   Nutmeg-Pasta, vegetables, poultry, fish dishes, and custard.  WHAT ARE SOME LOW-SODIUM INGREDIENTS AND FOODS?  Fresh or frozen fruits and vegetables with no sauce added.  Fresh or frozen whole meats, poultry, and fish with no sauce added.  Eggs.  Noodles, pasta, quinoa, rice.  Shredded or puffed wheat or puffed rice.  Regular or quick oats.  Milk, yogurt, low-sodium cheeses and hard cheeses (such as cheddar, [employer], or mozzarella). Always check the label for serving size and sodium content.  Unsalted butter or margarine.  Unsalted nuts.  Sherbet or ice cream (keep to  cup serving).  Homemade pudding.  Sodium-free baking soda and baking powder. This is not a complete list of  low-sodium ingredients and foods. Contact your dietitian for more options.  WHAT HIGH-SODIUM INGREDIENTS ARE NOT RECOMMENDED?  Sauces, such as mustard, barbecue sauce, soy sauce, teriyaki sauce, steak sauce, chili sauce, cocktail sauce, and tartar sauce.  Mixes, such as  flavored rice.  Instant products, such as ready-made pasta.  Horseradish.  Salsa.   Packaged gravies.   Angie Fava.  Olives.  Sauerkraut.  Salted nuts.   Cured or smoked meats (such as hot dogs, bacon, salami, ham, and bologna).   Processed vegetable juices, such as tomato juice.  Buttermilk.  Processed cheeses (such as cheese dips or cheese spread).  Cottage cheese.  Instant hot cereals.  Dessert mixes (ready-to-make) and store-bought cakes and pies.  Crackers with salted tops. This is not a complete list of high-sodium ingredients. Contact your dietitian for more options.  Document Released: 04/07/2005 Document Revised: 04/12/2013 Document Reviewed: 02/28/2013 [facility] Patient Information 2015 [place], [place]. This information is not intended to replace advice given to you by your health care provider. Make sure you discuss any questions you have with your health care provider. Low-Sodium Eating Plan Sodium raises blood pressure and causes water to be held in the body. Getting less sodium from food will help lower your blood pressure, reduce any swelling, and protect your heart, liver, and kidneys. We get sodium by adding salt (sodium chloride) to food. Most of our sodium comes from canned, boxed, and frozen foods. Restaurant foods, fast foods, and pizza are also very high in sodium. Even if you take medicine to lower your blood pressure or to reduce fluid in your body, getting less sodium from your food is important. WHAT IS MY PLAN? Most people should limit their sodium intake to 2,300 mg a day. Your health care provider recommends that you limit your sodium intake to __________ a day.  WHAT DO I NEED TO KNOW ABOUT THIS EATING PLAN? For the low-sodium eating plan, you will follow these general guidelines:  Choose foods with a % Daily Value for sodium of less than 5% (as listed on the food label).   Use salt-free seasonings or herbs instead of table salt or sea salt.    Check with your health care provider or pharmacist before using salt substitutes.   Eat fresh foods.  Eat more vegetables and fruits.  Limit canned vegetables. If you do use them, rinse them well to decrease the sodium.   Limit cheese to 1 oz (28 g) per day.   Eat lower-sodium products, often labeled as "lower sodium" or "no salt added."  Avoid foods that contain monosodium glutamate (MSG). MSG is sometimes added to [place] food and some canned foods.  Check food labels (Nutrition Facts labels) on foods to learn how much sodium is in one serving.  Eat more home-cooked food and less restaurant, buffet, and fast food.  When eating at a restaurant, ask that your food be prepared with less salt or none, if possible.  HOW DO I READ FOOD LABELS FOR SODIUM INFORMATION? The Nutrition Facts label lists the amount of sodium in one serving of the food. If you eat more than one serving, you must multiply the listed amount of sodium by the number of servings. Food labels may also identify foods as:  Sodium free--Less than 5 mg in a serving.  Very low sodium--35 mg or less in a serving.  Low sodium--140 mg or less in a serving.  Light in sodium--50% less sodium in a serving. For example, if a food that usually has  300 mg of sodium is changed to become light in sodium, it will have 150 mg of sodium.  Reduced sodium--25% less sodium in a serving. For example, if a food that usually has 400 mg of sodium is changed to reduced sodium, it will have 300 mg of sodium. WHAT FOODS CAN I EAT? Grains Low-sodium cereals, including oats, puffed wheat and rice, and shredded wheat cereals. Low-sodium crackers. Unsalted rice and pasta. Lower-sodium bread.  Vegetables Frozen or fresh vegetables. Low-sodium or reduced-sodium canned vegetables. Low-sodium or reduced-sodium tomato sauce and paste. Low-sodium or reduced-sodium tomato and vegetable juices.  Fruits Fresh, frozen, and canned fruit.  Fruit juice.  Meat and Other Protein Products Low-sodium canned tuna and salmon. Fresh or frozen meat, poultry, seafood, and fish. Lamb. Unsalted nuts. Dried beans, peas, and lentils without added salt. Unsalted canned beans. Homemade soups without salt. Eggs.  Dairy Milk. Soy milk. Ricotta cheese. Low-sodium or reduced-sodium cheeses. Yogurt.  Condiments Fresh and dried herbs and spices. Salt-free seasonings. Onion and garlic powders. Low-sodium varieties of mustard and ketchup. Lemon juice.  Fats and Oils Reduced-sodium salad dressings. Unsalted butter.  Other Unsalted popcorn and pretzels.  The items listed above may not be a complete list of recommended foods or beverages. Contact your dietitian for more options. WHAT FOODS ARE NOT RECOMMENDED? Grains Instant hot cereals. Bread stuffing, pancake, and biscuit mixes. Croutons. Seasoned rice or pasta mixes. Noodle soup cups. Boxed or frozen macaroni and cheese. Self-rising flour. Regular salted crackers. Vegetables Regular canned vegetables. Regular canned tomato sauce and paste. Regular tomato and vegetable juices. Frozen vegetables in sauces. Salted french fries. Olives. Angie Fava. Relishes. Sauerkraut. Salsa. Meat and Other Protein Products Salted, canned, smoked, spiced, or pickled meats, seafood, or fish. Bacon, ham, sausage, hot dogs, corned beef, chipped beef, and packaged luncheon meats. Salt pork. Jerky. Pickled herring. Anchovies, regular canned tuna, and sardines. Salted nuts. Dairy Processed cheese and cheese spreads. Cheese curds. Blue cheese and cottage cheese. Buttermilk.  Condiments Onion and garlic salt, seasoned salt, table salt, and sea salt. Canned and packaged gravies. Worcestershire sauce. Tartar sauce. Barbecue sauce. Teriyaki sauce. Soy sauce, including reduced sodium. Steak sauce. Fish sauce. Oyster sauce. Cocktail sauce. Horseradish. Regular ketchup and mustard. Meat flavorings and tenderizers. Bouillon  cubes. Hot sauce. Tabasco sauce. Marinades. Taco seasonings. Relishes. Fats and Oils Regular salad dressings. Salted butter. Margarine. Ghee. Bacon fat.  Other Potato and tortilla chips. Corn chips and puffs. Salted popcorn and pretzels. Canned or dried soups. Pizza. Frozen entrees and pot pies.  The items listed above may not be a complete list of foods and beverages to avoid. Contact your dietitian for more information. Document Released: 09/27/2001 Document Revised: 04/12/2013 Document Reviewed: 02/09/2013 [facility] Patient Information 2015 [place], [place]. This information is not intended to replace advice given to you by your health care provider. Make sure you discuss any questions you have with your health care provider.

## 2014-06-15 NOTE — Progress Notes (Signed)
   Subjective:    Patient ID: Jasmine Wheeler, female    DOB: 11-Jul-1945, 69 y.o.   MRN: [id number]  HPI  Wt Readings from Last 3 Encounters:  06/15/14 178 lb (80.74 kg)  04/24/14 169 lb (76.658 kg)  12/22/13 171 lb (77.565 kg)    Review of Systems     Objective:   Physical Exam        Assessment & Plan:

## 2014-06-15 NOTE — Progress Notes (Signed)
Subjective:    Patient ID: Jasmine Wheeler, female    DOB: Aug 20, 1945, 69 y.o.   MRN: [id number]  HPI  69 year old patient who has a history of essential hypertension.  The past 2 or 3 weeks she has noticed some increasing pedal edema.  This is absent the morning when she awakens, but worsens throughout the day.  The left leg is somewhat more bothersome than the right.  She denies any PND, orthopnea or dyspnea on exertion.  She has multi-drug-resistant hypertension.  She has been adhering to a low salt diet and has been compliant with her medications. Blood pressure readings at home have been a bit higher  Wt Readings from Last 3 Encounters:  06/15/14 178 lb (80.74 kg)  04/24/14 169 lb (76.658 kg)  12/22/13 171 lb (77.565 kg)     BP Readings from Last 3 Encounters:  06/15/14 152/90  04/24/14 142/80  04/09/14 117/62    Past Medical History  Diagnosis Date  . Hypertension   . Asthma   . Adenomatous colon polyp   . Hypothyroidism   . Chronic low back pain   . Diverticulosis 01/23/2010    left colon    History   Social History  . Marital Status: Married    Spouse Name: N/A  . Number of Children: 3  . 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 Smoking status: Never Smoker   . Smokeless tobacco: Never Used  . Alcohol Use: No  . Drug Use: No  . Sexual Activity: Not on file   Other Topics Concern  . Not on file   Social History Narrative    Past Surgical History  Procedure Laterality Date  . Hemicolectomy  02/15/10    right, tubulovillous adenoma appendix, Dr. Georgette Dover  . Abdominal hysterectomy    . Repeat cesarean section      3 in all, last 1973  . Lumbar epidural injection  2011    x 2  . Colonoscopy w/ biopsies  multiple  . Hemorrhoid banding  2014    Family History  Problem Relation Age of Onset  . Lung cancer Father   . Heart disease Father   . Colon cancer Mother   . Colon cancer Maternal  Uncle   . Colon cancer Maternal Uncle     Allergies  Allergen Reactions  . Nifedipine     REACTION: unspecified  . Theophylline     REACTION: unspecified    Current Outpatient Prescriptions on File Prior to Visit  Medication Sig Dispense Refill  . albuterol (PROVENTIL HFA;VENTOLIN HFA) 108 (90 BASE) MCG/ACT inhaler Inhale 2 puffs into the lungs every 6 (six) hours as needed for wheezing or shortness of breath. 1 Inhaler 4  . Ascorbic Acid (VITAMIN C) 500 MG tablet daily. Take 2 tablet daily     . aspirin 81 MG EC tablet Take 81 mg by mouth daily.      . Calcium Carbonate-Vitamin D (CALCIUM-VITAMIN D) 500-200 MG-UNIT per tablet Take 1 tablet by mouth 2 (two) times daily with a meal.      . cetirizine (ZYRTEC) 10 MG tablet Take 10 mg by mouth daily.    . Cranberry 1000 MG CAPS Take by mouth. Take 2 tablets daily     . diltiazem (TIAZAC) 360 MG 24 hr capsule TAKE 1 CAPSULE EVERY DAY 90 capsule 1  . fish oil-omega-3 fatty acids 1000 MG capsule Take 2  g by mouth 2 (two) times daily.      Marland Kitchen glucosamine-chondroitin 500-400 MG tablet Take 2 tablets by mouth daily.     Marland Kitchen levothyroxine (SYNTHROID, LEVOTHROID) 175 MCG tablet TAKE 1 TABLET EVERY DAY BEFORE BREAKFAST 90 tablet 1  . metoprolol succinate (TOPROL-XL) 50 MG 24 hr tablet TAKE 1 TABLET TWICE DAILY WITH OR IMMEDIATELY FOLLOWING MEALS 180 tablet 1  . Multiple Vitamin (MULTIVITAMIN) tablet Take 1 tablet by mouth daily.      . Probiotic Product ([place]) Take by mouth daily.    . quinapril (ACCUPRIL) 40 MG tablet TAKE 1 TABLET TWICE DAILY 180 tablet 1  . spironolactone (ALDACTONE) 25 MG tablet TAKE 1 TABLET EVERY DAY 90 tablet 1  . vitamin B-12 (CYANOCOBALAMIN) 500 MCG tablet Take 500 mcg by mouth daily.      . benzonatate (TESSALON) 200 MG capsule Take 1 capsule (200 mg total) by mouth 3 (three) times daily as needed for cough. (Patient not taking: Reported on 06/15/2014) 30 capsule 0  . [DISCONTINUED] diltiazem (CARDIZEM  CD) 360 MG 24 hr capsule Take 1 capsule (360 mg total) by mouth daily. 90 capsule 3   No current facility-administered medications on file prior to visit.    BP 152/90 mmHg  Pulse 69  Temp(Src) 98.1 F (36.7 C) (Oral)  Resp 20  Ht 5' 2.5" (1.588 m)  Wt 178 lb (80.74 kg)  BMI 32.02 kg/m2  SpO2 96%     Review of Systems  Constitutional: Positive for unexpected weight change.  HENT: Negative for congestion, dental problem, hearing loss, rhinorrhea, sinus pressure, sore throat and tinnitus.   Eyes: Negative for pain, discharge and visual disturbance.  Respiratory: Negative for cough and shortness of breath.   Cardiovascular: Positive for leg swelling. Negative for chest pain and palpitations.  Gastrointestinal: Negative for nausea, vomiting, abdominal pain, diarrhea, constipation, blood in stool and abdominal distention.  Genitourinary: Negative for dysuria, urgency, frequency, hematuria, flank pain, vaginal bleeding, vaginal discharge, difficulty urinating, vaginal pain and pelvic pain.  Musculoskeletal: Negative for joint swelling, arthralgias and gait problem.  Skin: Negative for rash.  Neurological: Negative for dizziness, syncope, speech difficulty, weakness, numbness and headaches.  Hematological: Negative for adenopathy.  Psychiatric/Behavioral: Negative for behavioral problems, dysphoric mood and agitation. The patient is not nervous/anxious.        Objective:   Physical Exam  Constitutional: She is oriented to person, place, and time. She appears well-developed and well-nourished.  Repeat blood pressure 140/70  HENT:  Head: Normocephalic.  Right Ear: External ear normal.  Left Ear: External ear normal.  Mouth/Throat: Oropharynx is clear and moist.  Eyes: Conjunctivae and EOM are normal. Pupils are equal, round, and reactive to light.  Neck: Normal range of motion. Neck supple. No thyromegaly present.  Cardiovascular: Normal rate, regular rhythm and intact distal  pulses.   Murmur heard. Grade 2/6 systolic murmur Grade 2/6 murmur of aortic sufficiency both loudest at the primary aortic area  Pulmonary/Chest: Effort normal and breath sounds normal.  Abdominal: Soft. Bowel sounds are normal. She exhibits no mass. There is no tenderness.  Musculoskeletal: Normal range of motion. She exhibits edema.  Plus 2  pitting edema  Lymphadenopathy:    She has no cervical adenopathy.  Neurological: She is alert and oriented to person, place, and time.  Skin: Skin is warm and dry. No rash noted.  Psychiatric: She has a normal mood and affect. Her behavior is normal.  Assessment & Plan:    Pedal edema.  Will stress.  Low-salt diet.  Will place on furosemide 40 mg daily. Probable aortic insufficiency.  Will check a 2-D echocardiogram;  unable to pull a recent prior EKGs. Hypertension.  Somewhat labile.  Suspect will improve with better volume control  Recheck 2 weeks.  Likely will refer to cardiology if AI  Confirmed  In spite of aldosterone antagonist and Ace inhibition.  Patient has a history of hypokalemia associated with Lasix.  Will place on potassium supplementation as well

## 2014-06-15 NOTE — Progress Notes (Signed)
Pre visit review using our clinic review tool, if applicable. No additional management support is needed unless otherwise documented below in the visit note. 

## 2014-06-16 LAB — COMPREHENSIVE METABOLIC PANEL
ALBUMIN: 4.2 g/dL (ref 3.5–5.2)
ALT: 16 U/L (ref 0–35)
AST: 18 U/L (ref 0–37)
Alkaline Phosphatase: 86 U/L (ref 39–117)
BUN: 25 mg/dL — ABNORMAL HIGH (ref 6–23)
CALCIUM: 9.6 mg/dL (ref 8.4–10.5)
CO2: 32 meq/L (ref 19–32)
Chloride: 102 mEq/L (ref 96–112)
Creatinine, Ser: 1 mg/dL (ref 0.40–1.20)
GFR: 58.08 mL/min — AB (ref >60.00)
Glucose, Bld: 150 mg/dL — ABNORMAL HIGH (ref 70–99)
POTASSIUM: 3.6 meq/L (ref 3.5–5.1)
Sodium: 139 mEq/L (ref 135–145)
TOTAL PROTEIN: 6.8 g/dL (ref 6.0–8.3)
Total Bilirubin: 0.3 mg/dL (ref 0.2–1.2)

## 2014-06-16 LAB — CBC WITH DIFFERENTIAL/PLATELET
Basophils Absolute: 0 10*3/uL (ref 0.0–0.1)
Basophils Relative: 0.5 % (ref 0.0–3.0)
Eosinophils Absolute: 0.2 10*3/uL (ref 0.0–0.7)
Eosinophils Relative: 2.3 % (ref 0.0–5.0)
HEMATOCRIT: 34.4 % — AB (ref 36.0–46.0)
HEMOGLOBIN: 11.6 g/dL — AB (ref 12.0–15.0)
LYMPHS PCT: 15.6 % (ref 12.0–46.0)
Lymphs Abs: 1.3 10*3/uL (ref 0.7–4.0)
MCHC: 33.8 g/dL (ref 30.0–36.0)
MCV: 88.2 fl (ref 78.0–100.0)
MONO ABS: 0.6 10*3/uL (ref 0.1–1.0)
Monocytes Relative: 7.2 % (ref 3.0–12.0)
Neutro Abs: 6.3 10*3/uL (ref 1.4–7.7)
Neutrophils Relative %: 74.4 % (ref 43.0–77.0)
Platelets: 262 10*3/uL (ref 150.0–400.0)
RBC: 3.91 Mil/uL (ref 3.87–5.11)
RDW: 14.6 % (ref 11.5–15.5)
WBC: 8.4 10*3/uL (ref 4.0–10.5)

## 2014-06-16 LAB — TSH: TSH: 0.17 u[IU]/mL — ABNORMAL LOW (ref 0.35–4.50)

## 2014-06-22 ENCOUNTER — Ambulatory Visit (HOSPITAL_COMMUNITY): Payer: Commercial Managed Care - HMO | Attending: Internal Medicine | Admitting: Radiology

## 2014-06-22 DIAGNOSIS — I10 Essential (primary) hypertension: Secondary | ICD-10-CM | POA: Diagnosis not present

## 2014-06-22 DIAGNOSIS — R600 Localized edema: Secondary | ICD-10-CM | POA: Insufficient documentation

## 2014-06-22 DIAGNOSIS — I429 Cardiomyopathy, unspecified: Secondary | ICD-10-CM | POA: Diagnosis not present

## 2014-06-22 NOTE — Progress Notes (Signed)
Echocardiogram performed.  

## 2014-06-23 ENCOUNTER — Other Ambulatory Visit: Payer: Self-pay | Admitting: Internal Medicine

## 2014-06-23 MED ORDER — LEVOTHYROXINE SODIUM 150 MCG PO TABS: 150.0000 ug | ORAL_TABLET | Freq: Every day | ORAL | Status: DC

## 2014-06-24 ENCOUNTER — Other Ambulatory Visit: Payer: Self-pay | Admitting: *Deleted

## 2014-06-24 NOTE — Telephone Encounter (Signed)
Opened in error Rx already sent.

## 2014-06-29 ENCOUNTER — Encounter: Payer: Self-pay | Admitting: Internal Medicine

## 2014-06-29 ENCOUNTER — Ambulatory Visit (INDEPENDENT_AMBULATORY_CARE_PROVIDER_SITE_OTHER): Payer: Commercial Managed Care - HMO | Admitting: Internal Medicine

## 2014-06-29 VITALS — BP 150/80 | HR 66 | Temp 98.0°F | Resp 20 | Ht 62.5 in | Wt 175.0 lb

## 2014-06-29 DIAGNOSIS — E039 Hypothyroidism, unspecified: Secondary | ICD-10-CM

## 2014-06-29 DIAGNOSIS — I10 Essential (primary) hypertension: Secondary | ICD-10-CM

## 2014-06-29 MED ORDER — SYNTHROID 150 MCG PO TABS: 150.0000 ug | ORAL_TABLET | Freq: Every day | ORAL | Status: DC

## 2014-06-29 MED ORDER — ALBUTEROL SULFATE HFA 108 (90 BASE) MCG/ACT IN AERS: 2.0000 | INHALATION_SPRAY | Freq: Four times a day (QID) | RESPIRATORY_TRACT | Status: DC | PRN

## 2014-06-29 NOTE — Patient Instructions (Signed)
Limit your sodium (Salt) intake  Please check your blood pressure on a regular basis.  If it is consistently greater than 150/90, please make an office appointment.  Return in 6 months for follow-up   

## 2014-06-29 NOTE — Progress Notes (Signed)
Subjective:    Patient ID: Jasmine Wheeler, female    DOB: [birth year], 69 y.o.   MRN: [id number]  HPI  69 year old patient who is seen today for follow-up.  She was seen 2 weeks ago with peripheral edema.  She now is on the furosemide and this has much improved.  Her levothyroxine dose was also down titrated due to a suppressed TSH. Feels well today.  She also had a murmur of AI.  2-D echocardiogram revealed extremely mild AI and otherwise normal  Past Medical History  Diagnosis Date  . Hypertension   . Asthma   . Adenomatous colon polyp   . Hypothyroidism   . Chronic low back pain   . Diverticulosis 01/23/2010    left colon    History   Social History  . Marital Status: Married    Spouse Name: N/A  . Number of Children: 3  . 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 Smoking status: Never Smoker   . Smokeless tobacco: Never Used  . Alcohol Use: No  . Drug Use: No  . Sexual Activity: Not on file   Other Topics Concern  . Not on file   Social History Narrative    Past Surgical History  Procedure Laterality Date  . Hemicolectomy  02/15/10    right, tubulovillous adenoma appendix, Dr. Georgette Dover  . Abdominal hysterectomy    . Repeat cesarean section      3 in all, last 1973  . Lumbar epidural injection  2011    x 2  . Colonoscopy w/ biopsies  multiple  . Hemorrhoid banding  2014    Family History  Problem Relation Age of Onset  . Lung cancer Father   . Heart disease Father   . Colon cancer Mother   . Colon cancer Maternal Uncle   . Colon cancer Maternal Uncle     Allergies  Allergen Reactions  . Nifedipine     REACTION: unspecified  . Theophylline     REACTION: unspecified    Current Outpatient Prescriptions on File Prior to Visit  Medication Sig Dispense Refill  . Ascorbic Acid (VITAMIN C) 500 MG tablet daily. Take 2 tablet daily     . aspirin 81 MG EC tablet Take 81 mg by mouth daily.        . benzonatate (TESSALON) 200 MG capsule Take 1 capsule (200 mg total) by mouth 3 (three) times daily as needed for cough. 30 capsule 0  . Calcium Carbonate-Vitamin D (CALCIUM-VITAMIN D) 500-200 MG-UNIT per tablet Take 1 tablet by mouth 2 (two) times daily with a meal.      . cetirizine (ZYRTEC) 10 MG tablet Take 10 mg by mouth daily.    . Cranberry 1000 MG CAPS Take by mouth. Take 2 tablets daily     . diltiazem (TIAZAC) 360 MG 24 hr capsule TAKE 1 CAPSULE EVERY DAY 90 capsule 1  . fish oil-omega-3 fatty acids 1000 MG capsule Take 2 g by mouth 2 (two) times daily.      . furosemide (LASIX) 40 MG tablet Take 1 tablet (40 mg total) by mouth daily. 30 tablet 3  . glucosamine-chondroitin 500-400 MG tablet Take 2 tablets by mouth daily.     Marland Kitchen levothyroxine (SYNTHROID, LEVOTHROID) 150 MCG tablet Take 1 tablet (150 mcg total) by mouth daily before breakfast. 90 tablet 2  . metoprolol succinate (TOPROL-XL) 50 MG 24 hr tablet  TAKE 1 TABLET TWICE DAILY WITH OR IMMEDIATELY FOLLOWING MEALS 180 tablet 1  . Multiple Vitamin (MULTIVITAMIN) tablet Take 1 tablet by mouth daily.      . potassium chloride SA (K-DUR,KLOR-CON) 20 MEQ tablet Take 1 tablet (20 mEq total) by mouth daily. 30 tablet 3  . Probiotic Product ([place]) Take by mouth daily.    . quinapril (ACCUPRIL) 40 MG tablet TAKE 1 TABLET TWICE DAILY 180 tablet 1  . spironolactone (ALDACTONE) 25 MG tablet TAKE 1 TABLET EVERY DAY 90 tablet 1  . vitamin B-12 (CYANOCOBALAMIN) 500 MCG tablet Take 500 mcg by mouth daily.      . [DISCONTINUED] diltiazem (CARDIZEM CD) 360 MG 24 hr capsule Take 1 capsule (360 mg total) by mouth daily. 90 capsule 3   No current facility-administered medications on file prior to visit.    BP 150/80 mmHg  Pulse 66  Temp(Src) 98 F (36.7 C) (Oral)  Resp 20  Ht 5' 2.5" (1.588 m)  Wt 175 lb (79.379 kg)  BMI 31.48 kg/m2  SpO2 96%     Review of Systems  Constitutional: Negative.   HENT: Negative for  congestion, dental problem, hearing loss, rhinorrhea, sinus pressure, sore throat and tinnitus.   Eyes: Negative for pain, discharge and visual disturbance.  Respiratory: Negative for cough and shortness of breath.   Cardiovascular: Positive for leg swelling. Negative for chest pain and palpitations.  Gastrointestinal: Negative for nausea, vomiting, abdominal pain, diarrhea, constipation, blood in stool and abdominal distention.  Genitourinary: Negative for dysuria, urgency, frequency, hematuria, flank pain, vaginal bleeding, vaginal discharge, difficulty urinating, vaginal pain and pelvic pain.  Musculoskeletal: Negative for joint swelling, arthralgias and gait problem.  Skin: Negative for rash.  Neurological: Negative for dizziness, syncope, speech difficulty, weakness, numbness and headaches.  Hematological: Negative for adenopathy.  Psychiatric/Behavioral: Negative for behavioral problems, dysphoric mood and agitation. The patient is not nervous/anxious.        Objective:   Physical Exam  Constitutional: She appears well-developed and well-nourished. No distress.  Cardiovascular:  Grade 5-5/3 systolic murmur Grade 7-4/8 diastolic murmur  Musculoskeletal: She exhibits edema.  Improved peripheral edema          Assessment & Plan:   Hypertension, stable Lower extremity edema improved 3.  Mild aortic insufficiency.  We'll continue to follow Hypothyroidism

## 2014-06-29 NOTE — Progress Notes (Signed)
Pre visit review using our clinic review tool, if applicable. No additional management support is needed unless otherwise documented below in the visit note. 

## 2014-06-30 ENCOUNTER — Telehealth: Payer: Self-pay | Admitting: Internal Medicine

## 2014-06-30 MED ORDER — FUROSEMIDE 40 MG PO TABS: 40.0000 mg | ORAL_TABLET | Freq: Every day | ORAL | Status: DC

## 2014-06-30 MED ORDER — POTASSIUM CHLORIDE CRYS ER 20 MEQ PO TBCR: 20.0000 meq | EXTENDED_RELEASE_TABLET | Freq: Every day | ORAL | Status: DC

## 2014-06-30 NOTE — Telephone Encounter (Signed)
Rxs sent to [facility] 

## 2014-06-30 NOTE — Telephone Encounter (Signed)
Pt needs new rxs klor- con  Meq #90 and furosemide 4 mg #90 w/refills sent to [facility] order pharm

## 2014-09-01 ENCOUNTER — Encounter: Payer: Self-pay | Admitting: Physician Assistant

## 2014-09-01 ENCOUNTER — Ambulatory Visit (INDEPENDENT_AMBULATORY_CARE_PROVIDER_SITE_OTHER): Payer: Commercial Managed Care - HMO | Admitting: Physician Assistant

## 2014-09-01 VITALS — BP 144/72 | HR 68 | Ht 62.5 in | Wt 175.6 lb

## 2014-09-01 DIAGNOSIS — Z86010 Personal history of colonic polyps: Secondary | ICD-10-CM | POA: Diagnosis not present

## 2014-09-01 DIAGNOSIS — Z800 Family history of malignant neoplasm of digestive organs: Secondary | ICD-10-CM

## 2014-09-01 DIAGNOSIS — R1084 Generalized abdominal pain: Secondary | ICD-10-CM

## 2014-09-01 DIAGNOSIS — K5730 Diverticulosis of large intestine without perforation or abscess without bleeding: Secondary | ICD-10-CM

## 2014-09-01 DIAGNOSIS — K625 Hemorrhage of anus and rectum: Secondary | ICD-10-CM

## 2014-09-01 NOTE — Patient Instructions (Addendum)
You have been scheduled for a colonoscopy. Please follow written instructions given to you at your visit today.  Please pick up your prep supplies at the pharmacy within the next 1-3 days. [place].  If you use inhalers (even only as needed), please bring them with you on the day of your procedure. Your physician has requested that you go to www.[email or web address].com and enter the access code given to you at your visit today. This web site gives a general overview about your procedure. However, you should still follow specific instructions given to you by our office regarding your preparation for the procedure.  We have given you a Low Gas Diet brochure. Please see your primary care physician regarding your blood pressure.

## 2014-09-01 NOTE — Progress Notes (Signed)
Patient ID: Jasmine Wheeler, female   DOB: May 19, 1945, 69 y.o.   MRN: [id number]   Subjective:    Patient ID: Jasmine Wheeler, female    DOB: 25-Jan-1946, 69 y.o.   MRN: [id number]  HPI Jasmine Wheeler is a pleasant 69 year old white female known to Dr. Carlean Purl. 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 Her last colonoscopy was done in October 2014 and noted to have severe diverticulosis and one 8 mm polyp was removed this was a "benign polyp". She is noted to have a prior ileocolic anastomosis. Patient comes in today with concerns about abdominal cramping pain and intermittent blood in her stools. Patient has also been noticing some leakage after bowel movements especially with looser bowel movements. She says she takes a fiber supplement every day and her bowels are generally regular 1-3 times per day. Over the past multiple months she's been having frequent episodes of cramping appetite is been fine and weight has been stable. She's noted bright red blood off and on over the past year both on the tissue and at times mixed with her stool. No rectal pain. Patient states she worries because her mother had colon cancer for which she died in her 105s. She also has 2 uncles who have both had colon cancer on the maternal side of the family and they were dilated diagnosed at younger ages 49s and 94s. Patient has undergone prior hemorrhoidal banding in 2014 for the internal hemorrhoids but says she didn't notice any difference after the banding.  Review of Systems Pertinent positive and negative review of systems were noted in the above HPI section.  All other review of systems was otherwise negative.  Outpatient Encounter Prescriptions as of 09/01/2014  Medication Sig  . albuterol (VENTOLIN HFA) 108 (90 BASE) MCG/ACT inhaler Inhale 2 puffs into the lungs every 6 (six) hours as needed for wheezing or shortness of breath.    . Ascorbic Acid (VITAMIN C) 500 MG tablet daily. Take 2 tablet daily   . aspirin 81 MG EC tablet Take 81 mg by mouth daily.    . Calcium Carbonate-Vitamin D (CALCIUM-VITAMIN D) 500-200 MG-UNIT per tablet Take 1 tablet by mouth 2 (two) times daily with a meal.    . cetirizine (ZYRTEC) 10 MG tablet Take 10 mg by mouth daily.  . Cranberry 1000 MG CAPS Take by mouth. Take 2 tablets daily   . diltiazem (TIAZAC) 360 MG 24 hr capsule TAKE 1 CAPSULE EVERY DAY  . fish oil-omega-3 fatty acids 1000 MG capsule Take 2 g by mouth 2 (two) times daily.    . furosemide (LASIX) 40 MG tablet Take 1 tablet (40 mg total) by mouth daily.  Marland Kitchen glucosamine-chondroitin 500-400 MG tablet Take 2 tablets by mouth daily.   . metoprolol succinate (TOPROL-XL) 50 MG 24 hr tablet TAKE 1 TABLET TWICE DAILY WITH OR IMMEDIATELY FOLLOWING MEALS  . Multiple Vitamin (MULTIVITAMIN) tablet Take 1 tablet by mouth daily.    . potassium chloride SA (K-DUR,KLOR-CON) 20 MEQ tablet Take 1 tablet (20 mEq total) by mouth daily.  . Probiotic Product ([place]) Take by mouth daily.  . quinapril (ACCUPRIL) 40 MG tablet TAKE 1 TABLET TWICE DAILY  . spironolactone (ALDACTONE) 25 MG tablet TAKE 1 TABLET EVERY DAY  . SYNTHROID 150 MCG tablet Take 1 tablet (150 mcg total) by mouth daily before breakfast.  . vitamin B-12 (CYANOCOBALAMIN) 500 MCG tablet Take 500 mcg by  mouth daily.    . [DISCONTINUED] benzonatate (TESSALON) 200 MG capsule Take 1 capsule (200 mg total) by mouth 3 (three) times daily as needed for cough.   No facility-administered encounter medications on file as of 09/01/2014.   Allergies  Allergen Reactions  . Nifedipine     REACTION: unspecified  . Theophylline     REACTION: unspecified   Patient Active Problem List   Diagnosis Date Noted  . Internal and external prolapsed hemorrhoids 01/20/2013  . Impaired glucose tolerance 02/09/2012  . LOW BACK PAIN 12/17/2009  . Asthma 08/16/2009  . Hypothyroidism  11/03/2006  . Essential hypertension 11/03/2006  . MENOPAUSAL SYNDROME 11/03/2006  . COLONIC POLYPS - TUBULOVILLOUS ADENOMA, HX OF 11/03/2006   History   Social History  . Marital Status: Married    Spouse Name: N/A  . Number of Children: 3  . 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 Smoking status: Never Smoker   . Smokeless tobacco: Never Used  . Alcohol Use: No  . Drug Use: No  . Sexual Activity: Not on file   Other Topics Concern  . Not on file   Social History Narrative    Ms. Romey's family history includes Colon cancer in her maternal uncle, maternal uncle, and mother; Heart disease in her father; Lung cancer in her father.      Objective:    Filed Vitals:   09/01/14 1030  BP: 144/72  Pulse:     Physical Exam  well-developed older white female in no acute distress, pleasant blood pressure 144/72, pulse 68 height 5 foot 2 weight 175 BMI 31. HEENT; nontraumatic normocephalic EOMI PERRLA sclera anicteric, Supple; no JVD, Cardiovascular; regular rate and rhythm with T4-S5 soft systolic murmur, Pulmonary; clear bilaterally, Abdomen; soft, no focal tenderness no guarding or rebound no palpable mass or hepatosplenomegaly bowel sounds are present, Rectal ;exam large external hemorrhoidal tags anal sphincter tone is decreased stool brown and heme-negative currently, Extremities; no clubbing cyanosis or edema skin warm and dry, Psych ;mood and affect appropriate       Assessment & Plan:   #1 69 yo female with abdominal cramping and intermittent rectal bleeding  With blood mixed with stool and in commode- (She has  hx of internal hemorrhoids with banding in 2014 with no improvement in hemorrhoids sxs at that time). Now with intermittent abd pain,cramping, gas  and rectal bleeding  X one year - with r/o occult lesion, vs bleeding secondary to internal hemorrhoids #2 Family hx of colon cancer - 3 relatives on maternal  side #3 Hx of adenomatous polyps #4 HTN #5 Asthma  Plan; low gas diet Schedule for Colonoscopy with Dr Carlean Purl procedure discussed in detail with pt and she is agreeable to proceed.Banding can be rediscussed if source found to be  hemorrhoidal    Amy Genia Harold PA-C 09/01/2014   Cc: Marletta Lor, MD

## 2014-09-05 ENCOUNTER — Encounter: Payer: Self-pay | Admitting: Adult Health

## 2014-09-05 ENCOUNTER — Ambulatory Visit (INDEPENDENT_AMBULATORY_CARE_PROVIDER_SITE_OTHER): Payer: Commercial Managed Care - HMO | Admitting: Adult Health

## 2014-09-05 VITALS — BP 130/80 | HR 80 | Temp 98.5°F | Wt 177.4 lb

## 2014-09-05 DIAGNOSIS — R05 Cough: Secondary | ICD-10-CM | POA: Diagnosis not present

## 2014-09-05 DIAGNOSIS — J069 Acute upper respiratory infection, unspecified: Secondary | ICD-10-CM

## 2014-09-05 DIAGNOSIS — R062 Wheezing: Secondary | ICD-10-CM

## 2014-09-05 DIAGNOSIS — R059 Cough, unspecified: Secondary | ICD-10-CM

## 2014-09-05 MED ORDER — BENZONATATE 200 MG PO CAPS: 200.0000 mg | ORAL_CAPSULE | Freq: Three times a day (TID) | ORAL | Status: DC | PRN

## 2014-09-05 MED ORDER — PREDNISONE 20 MG PO TABS: 20.0000 mg | ORAL_TABLET | Freq: Every day | ORAL | Status: DC

## 2014-09-05 NOTE — Progress Notes (Signed)
Subjective:    Patient ID: Jasmine Wheeler, female    DOB: 1946-02-22, 69 y.o.   MRN: [id number]  HPI Patient presents to the office today for one week of right sided sinus pressure with frontal headache right eye bruising, and right sided tooth pain. This lasted till Friday and then started to get better. On Sunday she endorsed having a cough.  This morning when she woke up she had a productive cough with yellow mucus. Clear nasal drainage.   Denies fever at home, no nausea, vomiting or diarrhea.      Review of Systems  Constitutional: Positive for activity change. Negative for fever, chills, appetite change and fatigue.  HENT: Positive for congestion, postnasal drip, rhinorrhea, sinus pressure and sore throat. Negative for ear discharge, ear pain, trouble swallowing and voice change.   Eyes: Negative for pain and itching.       Darkness under right eye  Respiratory: Positive for cough, shortness of breath and wheezing. Negative for apnea, chest tightness and stridor.   Cardiovascular: Negative for chest pain, palpitations and leg swelling.  Endocrine: Negative for cold intolerance and heat intolerance.  All other systems reviewed and are negative.      Objective:   Physical Exam  Constitutional: She is oriented to person, place, and time. She appears well-developed and well-nourished. No distress.  Tired looking  HENT:  Head: Normocephalic and atraumatic.  Right Ear: External ear normal.  Left Ear: External ear normal.  Nose: Nose normal.  Mouth/Throat: Oropharynx is clear and moist. No oropharyngeal exudate.  Eyes: Conjunctivae and EOM are normal. Pupils are equal, round, and reactive to light. Right eye exhibits no discharge. Left eye exhibits no discharge.  No bruising noted under right or left eye  Neck: No thyromegaly present.  Cardiovascular: Normal rate, regular rhythm, normal heart sounds and intact distal pulses.  Exam reveals no gallop and no friction rub.   No  murmur heard. Pulmonary/Chest: Effort normal. No respiratory distress. She has wheezes (mild exp. wheezing in all lung fields). She has no rales. She exhibits no tenderness.  Lymphadenopathy:    She has cervical adenopathy (Superficial Cervical).  Neurological: She is alert and oriented to person, place, and time.  Skin: Skin is warm and dry.  Psychiatric: She has a normal mood and affect. Her behavior is normal. Judgment and thought content normal.  Nursing note and vitals reviewed.      Assessment & Plan:  1. Acute upper respiratory infection - Continue to use zyrtex - Start using Flonase twice a day, one squirt in each nostril.  - predniSONE (DELTASONE) 20 MG tablet; Take 1 tablet (20 mg total) by mouth daily with breakfast.  Dispense: 9 tablet; Refill: 0 - Follow up if not feeling better or sooner if symptoms worsen or have a fever greater than 101.   2. Wheezing - predniSONE (DELTASONE) 20 MG tablet; Take 1 tablet (20 mg total) by mouth daily with breakfast.  Dispense: 9 tablet; Refill: 0 - Continue to use Albuterol inhaler as directed - Follow up if not feeling better or sooner if symptoms worsen or have a fever greater than 101.  - Go to the ER with any SOB, difficulty swallowing or feeling as though your throat is closing.     3. Cough - benzonatate (TESSALON) 200 MG capsule; Take 1 capsule (200 mg total) by mouth 3 (three) times daily as needed for cough.  Dispense: 20 capsule; Refill: 0 - predniSONE (DELTASONE) 20 MG tablet; Take  1 tablet (20 mg total) by mouth daily with breakfast.  Dispense: 9 tablet; Refill: 0

## 2014-09-05 NOTE — Patient Instructions (Addendum)
I have sent a prescription to your pharmacy for Tessalon pearls, you can take this up to three times a day as needed for cough  I have also prescribed prednisone which should help with you upper respiratory infection and wheezing.   Day 1 40 mg Day 2 40 mg Day 3 30 mg Day 4 30 mg Day 5 20 mg Day 6 10 mg Day 7 10 mg  Continue to take your zyrtec and mucinex as well as start Flonase. Drink plenty of fluids and get rest. If you are not feeling any better in the next 2-3 days, please let me know. I hope you feel better.   Upper Respiratory Infection, Adult An upper respiratory infection (URI) is also sometimes known as the common cold. The upper respiratory tract includes the nose, sinuses, throat, trachea, and bronchi. Bronchi are the airways leading to the lungs. Most people improve within 1 week, but symptoms can last up to 2 weeks. A residual cough may last even longer.  CAUSES Many different viruses can infect the tissues lining the upper respiratory tract. The tissues become irritated and inflamed and often become very moist. Mucus production is also common. A cold is contagious. You can easily spread the virus to others by oral contact. This includes kissing, sharing a glass, coughing, or sneezing. Touching your mouth or nose and then touching a surface, which is then touched by another person, can also spread the virus. SYMPTOMS  Symptoms typically develop 1 to 3 days after you come in contact with a cold virus. Symptoms vary from person to person. They may include:  Runny nose.  Sneezing.  Nasal congestion.  Sinus irritation.  Sore throat.  Loss of voice (laryngitis).  Cough.  Fatigue.  Muscle aches.  Loss of appetite.  Headache.  Low-grade fever. DIAGNOSIS  You might diagnose your own cold based on familiar symptoms, since most people get a cold 2 to 3 times a year. Your caregiver can confirm this based on your exam. Most importantly, your caregiver can check that  your symptoms are not due to another disease such as strep throat, sinusitis, pneumonia, asthma, or epiglottitis. Blood tests, throat tests, and X-rays are not necessary to diagnose a common cold, but they may sometimes be helpful in excluding other more serious diseases. Your caregiver will decide if any further tests are required. RISKS AND COMPLICATIONS  You may be at risk for a more severe case of the common cold if you smoke cigarettes, have chronic heart disease (such as heart failure) or lung disease (such as asthma), or if you have a weakened immune system. The very young and very old are also at risk for more serious infections. Bacterial sinusitis, middle ear infections, and bacterial pneumonia can complicate the common cold. The common cold can worsen asthma and chronic obstructive pulmonary disease (COPD). Sometimes, these complications can require emergency medical care and may be life-threatening. PREVENTION  The best way to protect against getting a cold is to practice good hygiene. Avoid oral or hand contact with people with cold symptoms. Wash your hands often if contact occurs. There is no clear evidence that vitamin C, vitamin E, echinacea, or exercise reduces the chance of developing a cold. However, it is always recommended to get plenty of rest and practice good nutrition. TREATMENT  Treatment is directed at relieving symptoms. There is no cure. Antibiotics are not effective, because the infection is caused by a virus, not by bacteria. Treatment may include:  Increased fluid intake.  Sports drinks offer valuable electrolytes, sugars, and fluids.  Breathing heated mist or steam (vaporizer or shower).  Eating chicken soup or other clear broths, and maintaining good nutrition.  Getting plenty of rest.  Using gargles or lozenges for comfort.  Controlling fevers with ibuprofen or acetaminophen as directed by your caregiver.  Increasing usage of your inhaler if you have  asthma. Zinc gel and zinc lozenges, taken in the first 24 hours of the common cold, can shorten the duration and lessen the severity of symptoms. Pain medicines may help with fever, muscle aches, and throat pain. A variety of non-prescription medicines are available to treat congestion and runny nose. Your caregiver can make recommendations and may suggest nasal or lung inhalers for other symptoms.  HOME CARE INSTRUCTIONS   Only take over-the-counter or prescription medicines for pain, discomfort, or fever as directed by your caregiver.  Use a warm mist humidifier or inhale steam from a shower to increase air moisture. This may keep secretions moist and make it easier to breathe.  Drink enough water and fluids to keep your urine clear or pale yellow.  Rest as needed.  Return to work when your temperature has returned to normal or as your caregiver advises. You may need to stay home longer to avoid infecting others. You can also use a face mask and careful hand washing to prevent spread of the virus. SEEK MEDICAL CARE IF:   After the first few days, you feel you are getting worse rather than better.  You need your caregiver's advice about medicines to control symptoms.  You develop chills, worsening shortness of breath, or brown or red sputum. These may be signs of pneumonia.  You develop yellow or brown nasal discharge or pain in the face, especially when you bend forward. These may be signs of sinusitis.  You develop a fever, swollen neck glands, pain with swallowing, or white areas in the back of your throat. These may be signs of strep throat. SEEK IMMEDIATE MEDICAL CARE IF:   You have a fever.  You develop severe or persistent headache, ear pain, sinus pain, or chest pain.  You develop wheezing, a prolonged cough, cough up blood, or have a change in your usual mucus (if you have chronic lung disease).  You develop sore muscles or a stiff neck. Document Released: 10/01/2000  Document Revised: 06/30/2011 Document Reviewed: 07/13/2013 [facility] Patient Information 2015 [place], [place]. This information is not intended to replace advice given to you by your health care provider. Make sure you discuss any questions you have with your health care provider.

## 2014-09-05 NOTE — Progress Notes (Signed)
Pre visit review using our clinic review tool, if applicable. No additional management support is needed unless otherwise documented below in the visit note. 

## 2014-09-22 NOTE — Progress Notes (Signed)
Agree with Ms. Esterwood's assessment and plan. Carl E. Gessner, MD, FACG   

## 2014-09-27 DIAGNOSIS — D2272 Melanocytic nevi of left lower limb, including hip: Secondary | ICD-10-CM | POA: Diagnosis not present

## 2014-09-27 DIAGNOSIS — D1801 Hemangioma of skin and subcutaneous tissue: Secondary | ICD-10-CM | POA: Diagnosis not present

## 2014-09-27 DIAGNOSIS — L821 Other seborrheic keratosis: Secondary | ICD-10-CM | POA: Diagnosis not present

## 2014-09-27 DIAGNOSIS — Z85820 Personal history of malignant melanoma of skin: Secondary | ICD-10-CM | POA: Diagnosis not present

## 2014-09-27 DIAGNOSIS — L814 Other melanin hyperpigmentation: Secondary | ICD-10-CM | POA: Diagnosis not present

## 2014-09-27 DIAGNOSIS — D225 Melanocytic nevi of trunk: Secondary | ICD-10-CM | POA: Diagnosis not present

## 2014-10-24 ENCOUNTER — Other Ambulatory Visit: Payer: Self-pay | Admitting: Internal Medicine

## 2014-10-24 ENCOUNTER — Ambulatory Visit: Payer: Medicare HMO | Admitting: Internal Medicine

## 2014-10-26 ENCOUNTER — Encounter: Payer: Self-pay | Admitting: Internal Medicine

## 2014-10-26 ENCOUNTER — Ambulatory Visit (AMBULATORY_SURGERY_CENTER): Payer: Commercial Managed Care - HMO | Admitting: Internal Medicine

## 2014-10-26 VITALS — BP 111/66 | HR 56 | Temp 97.0°F | Resp 13 | Ht 62.0 in | Wt 175.0 lb

## 2014-10-26 DIAGNOSIS — K648 Other hemorrhoids: Secondary | ICD-10-CM

## 2014-10-26 DIAGNOSIS — K5730 Diverticulosis of large intestine without perforation or abscess without bleeding: Secondary | ICD-10-CM

## 2014-10-26 DIAGNOSIS — R1084 Generalized abdominal pain: Secondary | ICD-10-CM

## 2014-10-26 DIAGNOSIS — K625 Hemorrhage of anus and rectum: Secondary | ICD-10-CM

## 2014-10-26 DIAGNOSIS — Z860100 Personal history of colon polyps, unspecified: Secondary | ICD-10-CM

## 2014-10-26 DIAGNOSIS — I10 Essential (primary) hypertension: Secondary | ICD-10-CM | POA: Diagnosis not present

## 2014-10-26 MED ORDER — SODIUM CHLORIDE 0.9 % IV SOLN: 500.0000 mL | INTRAVENOUS | Status: DC

## 2014-10-26 NOTE — Progress Notes (Signed)
Patient awakening,vss,report to rn 

## 2014-10-26 NOTE — Patient Instructions (Addendum)
Hemorrhoids and diverticulosis seen. No polyps. Your next routine colonoscopy should be in 5 years - 2021.  If your hemorrhoids are still bothering you it could help to try banding again. Please make an appointment if desired.  I appreciate the opportunity to care for you. Gatha Mayer, MD, FACG   YOU HAD AN ENDOSCOPIC PROCEDURE TODAY AT [facility]:   Refer to the procedure report that was given to you for any specific questions about what was found during the examination.  If the procedure report does not answer your questions, please call your gastroenterologist to clarify.  If you requested that your care partner not be given the details of your procedure findings, then the procedure report has been included in a sealed envelope for you to review at your convenience later.  YOU SHOULD EXPECT: Some feelings of bloating in the abdomen. Passage of more gas than usual.  Walking can help get rid of the air that was put into your GI tract during the procedure and reduce the bloating. If you had a lower endoscopy (such as a colonoscopy or flexible sigmoidoscopy) you may notice spotting of blood in your stool or on the toilet paper. If you underwent a bowel prep for your procedure, you may not have a normal bowel movement for a few days.  Please Note:  You might notice some irritation and congestion in your nose or some drainage.  This is from the oxygen used during your procedure.  There is no need for concern and it should clear up in a day or so.  SYMPTOMS TO REPORT IMMEDIATELY:   Following lower endoscopy (colonoscopy or flexible sigmoidoscopy):  Excessive amounts of blood in the stool  Significant tenderness or worsening of abdominal pains  Swelling of the abdomen that is new, acute  Fever of 100F or higher  For urgent or emergent issues, a gastroenterologist can be reached at any hour by calling [phone].   DIET: Your first meal following the  procedure should be a small meal and then it is ok to progress to your normal diet. Heavy or fried foods are harder to digest and may make you feel nauseous or bloated.  Likewise, meals heavy in dairy and vegetables can increase bloating.  Drink plenty of fluids but you should avoid alcoholic beverages for 24 hours.  ACTIVITY:  You should plan to take it easy for the rest of today and you should NOT DRIVE or use heavy machinery until tomorrow (because of the sedation medicines used during the test).    FOLLOW UP: Our staff will call the number listed on your records the next business day following your procedure to check on you and address any questions or concerns that you may have regarding the information given to you following your procedure. If we do not reach you, we will leave a message.  However, if you are feeling well and you are not experiencing any problems, there is no need to return our call.  We will assume that you have returned to your regular daily activities without incident.  If any biopsies were taken you will be contacted by phone or by letter within the next 1-3 weeks.  Please call us at [phone] if you have not heard about the biopsies in 3 weeks.    SIGNATURES/CONFIDENTIALITY: You and/or your care partner have signed paperwork which will be entered into your electronic medical record.  These signatures attest to the fact that that the  information above on your After Visit Summary has been reviewed and is understood.  Full responsibility of the confidentiality of this discharge information lies with you and/or your care-partner.  Diverticulosis, hemorrhoids-handouts given

## 2014-10-26 NOTE — Op Note (Signed)
[place]  [address] [place], [address]   COLONOSCOPY PROCEDURE REPORT  PATIENT: Jasmine Wheeler, Jasmine Wheeler  MR#: [id number] BIRTHDATE: 27-Feb-1946 , 69  yrs. old GENDER: female ENDOSCOPIST: Gatha Mayer, MD, [facility] PROCEDURE DATE:  10/26/2014 PROCEDURE:   Colonoscopy, diagnostic First Screening Colonoscopy - Avg.  risk and is 50 yrs.  old or older - No.  Prior Negative Screening - Now for repeat screening. N/A  History of Adenoma - Now for follow-up colonoscopy & has been > or = to 3 yrs.  N/A  Polyps removed today? No Recommend repeat exam, <10 yrs? Yes high risk ASA CLASS:   Class II INDICATIONS:Evaluation of unexplained GI bleeding and Patient is not applicable for Colorectal Neoplasm Risk Assessment for this procedure. MEDICATIONS: Propofol 150 mg IV and Monitored anesthesia care  DESCRIPTION OF PROCEDURE:   After the risks benefits and alternatives of the procedure were thoroughly explained, informed consent was obtained.  The digital rectal exam revealed no rectal mass and revealed several skin tags.   The LB [id number] endoscope was introduced through the anus and advanced to the surgical anastomosis. No adverse events experienced.   The quality of the prep was good.  (MiraLax was used)  The instrument was then slowly withdrawn as the colon was fully examined. Estimated blood loss is zero unless otherwise noted in this procedure report.      COLON FINDINGS: There was diverticulosis noted throughout the entire examined colon.   There was evidence of a prior ileocolonic surgical anastomosis in the right colon.   The examination was otherwise normal.   External and internal hemorrhoids were found. Retroflexed views revealed internal hemorrhoids and Retroflexed views revealed external hemorrhoids. The time to cecum = 2.0 Withdrawal time = 6.4   The scope was withdrawn and the procedure completed. COMPLICATIONS: There were no immediate  complications.  ENDOSCOPIC IMPRESSION: 1.   Diverticulosis was noted throughout the entire examined colon 2.   There was evidence of a prior ileocolonic surgical anastomosis in the right colon 3.   The examination was otherwise normal 4.   External and internal hemorrhoids  RECOMMENDATIONS: 1.  Repeat colonoscopy in 5 years. 2.  Retry hemorrhoid banding if desired  eSigned:  Gatha Mayer, MD, [facility] 10/26/2014 1:57 PM   cc: The Patient

## 2014-10-27 ENCOUNTER — Telehealth: Payer: Self-pay | Admitting: *Deleted

## 2014-10-27 NOTE — Telephone Encounter (Signed)
  Follow up Call-  Call back number 10/26/2014 01/21/2013  Post procedure Call Back phone  # [phone]  Permission to leave phone message Yes Yes     Patient questions:  Do you have a fever, pain , or abdominal swelling? No. Pain Score  0 *  Have you tolerated food without any problems? Yes.    Have you been able to return to your normal activities? Yes.    Do you have any questions about your discharge instructions: Diet   No. Medications  No. Follow up visit  No.  Do you have questions or concerns about your Care? No.  Actions: * If pain score is 4 or above: No action needed, pain <4.

## 2014-12-14 ENCOUNTER — Telehealth: Payer: Self-pay | Admitting: Internal Medicine

## 2014-12-14 NOTE — Telephone Encounter (Signed)
Patient is scheduled for 01/11/15 3:15

## 2014-12-22 ENCOUNTER — Other Ambulatory Visit (INDEPENDENT_AMBULATORY_CARE_PROVIDER_SITE_OTHER): Payer: Commercial Managed Care - HMO

## 2014-12-22 DIAGNOSIS — Z0000 Encounter for general adult medical examination without abnormal findings: Secondary | ICD-10-CM | POA: Diagnosis not present

## 2014-12-22 LAB — BASIC METABOLIC PANEL
BUN: 29 mg/dL — AB (ref 6–23)
CALCIUM: 9.2 mg/dL (ref 8.4–10.5)
CHLORIDE: 104 meq/L (ref 96–112)
CO2: 31 mEq/L (ref 19–32)
CREATININE: 1.3 mg/dL — AB (ref 0.40–1.20)
GFR: 42.84 mL/min — ABNORMAL LOW (ref >60.00)
Glucose, Bld: 108 mg/dL — ABNORMAL HIGH (ref 70–99)
Potassium: 4.2 mEq/L (ref 3.5–5.1)
Sodium: 142 mEq/L (ref 135–145)

## 2014-12-22 LAB — CBC WITH DIFFERENTIAL/PLATELET
BASOS ABS: 0 10*3/uL (ref 0.0–0.1)
Basophils Relative: 0.3 % (ref 0.0–3.0)
Eosinophils Absolute: 0.2 10*3/uL (ref 0.0–0.7)
Eosinophils Relative: 4.3 % (ref 0.0–5.0)
HEMATOCRIT: 33.3 % — AB (ref 36.0–46.0)
Hemoglobin: 11.2 g/dL — ABNORMAL LOW (ref 12.0–15.0)
LYMPHS ABS: 1.4 10*3/uL (ref 0.7–4.0)
LYMPHS PCT: 27.1 % (ref 12.0–46.0)
MCHC: 33.6 g/dL (ref 30.0–36.0)
MCV: 89.8 fl (ref 78.0–100.0)
MONOS PCT: 6.9 % (ref 3.0–12.0)
Monocytes Absolute: 0.4 10*3/uL (ref 0.1–1.0)
NEUTROS PCT: 61.4 % (ref 43.0–77.0)
Neutro Abs: 3.1 10*3/uL (ref 1.4–7.7)
Platelets: 221 10*3/uL (ref 150.0–400.0)
RBC: 3.72 Mil/uL — AB (ref 3.87–5.11)
RDW: 13.3 % (ref 11.5–15.5)
WBC: 5.1 10*3/uL (ref 4.0–10.5)

## 2014-12-22 LAB — LIPID PANEL
CHOL/HDL RATIO: 5
Cholesterol: 202 mg/dL — ABNORMAL HIGH (ref 0–200)
HDL: 40.6 mg/dL (ref >39.00)
LDL CALC: 122 mg/dL — AB (ref 0–99)
NonHDL: 161.22
TRIGLYCERIDES: 195 mg/dL — AB (ref 0.0–149.0)
VLDL: 39 mg/dL (ref 0.0–40.0)

## 2014-12-22 LAB — POCT URINALYSIS DIPSTICK
BILIRUBIN UA: NEGATIVE
GLUCOSE UA: NEGATIVE
Ketones, UA: NEGATIVE
NITRITE UA: NEGATIVE
Protein, UA: NEGATIVE
RBC UA: NEGATIVE
Spec Grav, UA: 1.02
Urobilinogen, UA: 0.2
pH, UA: 6

## 2014-12-22 LAB — HEPATIC FUNCTION PANEL
ALBUMIN: 4 g/dL (ref 3.5–5.2)
ALK PHOS: 72 U/L (ref 39–117)
ALT: 13 U/L (ref 0–35)
AST: 12 U/L (ref 0–37)
Bilirubin, Direct: 0.1 mg/dL (ref 0.0–0.3)
Total Bilirubin: 0.3 mg/dL (ref 0.2–1.2)
Total Protein: 6.4 g/dL (ref 6.0–8.3)

## 2014-12-22 LAB — TSH: TSH: 1.65 u[IU]/mL (ref 0.35–4.50)

## 2014-12-29 ENCOUNTER — Ambulatory Visit (INDEPENDENT_AMBULATORY_CARE_PROVIDER_SITE_OTHER): Payer: Commercial Managed Care - HMO | Admitting: Internal Medicine

## 2014-12-29 ENCOUNTER — Encounter: Payer: Self-pay | Admitting: Internal Medicine

## 2014-12-29 VITALS — BP 140/80 | HR 63 | Temp 98.6°F | Resp 20 | Ht 62.5 in | Wt 182.0 lb

## 2014-12-29 DIAGNOSIS — R7302 Impaired glucose tolerance (oral): Secondary | ICD-10-CM

## 2014-12-29 DIAGNOSIS — Z0000 Encounter for general adult medical examination without abnormal findings: Secondary | ICD-10-CM

## 2014-12-29 DIAGNOSIS — E034 Atrophy of thyroid (acquired): Secondary | ICD-10-CM

## 2014-12-29 DIAGNOSIS — I10 Essential (primary) hypertension: Secondary | ICD-10-CM

## 2014-12-29 DIAGNOSIS — Z86010 Personal history of colonic polyps: Secondary | ICD-10-CM

## 2014-12-29 MED ORDER — FUROSEMIDE 40 MG PO TABS: ORAL_TABLET | ORAL | Status: DC

## 2014-12-29 NOTE — Progress Notes (Signed)
Patient ID: Jasmine Wheeler, female   DOB: 1946/01/12, 69 y.o.   MRN: [id number]  Subjective:    Patient ID: Jasmine Wheeler, female    DOB: 01/14/1946, 69 y.o.   MRN: [id number]  HPI    History of Present Illness:   69   year-old patient who is seen today for a comprehensive evaluation.   Medical problems include multi-drug-resistant hypertension. She has hypothyroidism. History colonic polyps and a history of mild asthma.  Her last colonoscopy July 2016.  She is followed closely by gynecology.  She is scheduled for internal hemorrhoidal banding by GI.  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 Theda Sers)  Family History:   Family History Lung cancer  Family History Other cancer-Cervical  Fam hx CAD  Fam hx COPD  Family History Diabetes 1st degree relative  father died at age 52, lung cancer.  Mother history coronary disease, hypertension, COPD, hypothyroidism, CAD; history of rectal cancer  one brother one sister. Positive for CAD s/p CABG and cervical cancer (Sister died age 22)  MGM-CNS Ca  2 maternal uncles-colon ca   Social History:    Married  works at [employer] Smoking Status: never   MWV:  1. Risk factors, based on past  M,S,F history- cardiovascular risk factors include hypertension 2.  Physical activities: Remains quite active still is  employed full-time no exercise limitations  3.  Depression/mood: No history depression or mood disorder  4.  Hearing: No deficits  5.  ADL's: Independent in all aspect of daily living  6.  Fall risk: Low  7.  Home safety: No problems identified  8.  Height weight, and visual acuity; height and weight stable no change in visual acuity  9.  Counseling: Heart healthy diet encouraged restricted salt diet encouraged regular exercise encouraged  10. Lab orders based on risk factors: Laboratory profile including TSH and lipid panel reviewed  11. Referral : Will follow up with gynecology in December will have a mammogram and bone density at that time  12. Care plan: We'll regular exercise and modest weight loss encouraged  13. Cognitive assessment: Alert and oriented normal affect no cognitive dysfunction  14.  Preventive services will include annual primary care evaluation.  Annual eye examinations and colonoscopies at five-year intervals.  Mammogram will be encouraged annually or biannually.  Annual dermatologic exams will be encouraged  15.  Provider list includes primary care GI ophthalmology.  GYN and radiology.  She is also seen periodically by orthopedics as well as dermatology     Review of Systems  Constitutional: Negative for fever, appetite change, fatigue and unexpected weight change.  HENT: Negative for congestion, dental problem, ear pain, hearing loss, mouth sores, nosebleeds, sinus pressure, sore throat, tinnitus, trouble swallowing and voice change.   Eyes: Negative for photophobia, pain, redness and visual disturbance.  Respiratory: Negative for cough, chest tightness and shortness of breath.   Cardiovascular: Negative for chest pain, palpitations and leg swelling.  Gastrointestinal: Negative for nausea, vomiting, abdominal pain, diarrhea, constipation, blood in stool, abdominal distention and rectal pain.  Genitourinary: Negative for dysuria, urgency, frequency,  hematuria, flank pain, vaginal bleeding, vaginal discharge, difficulty urinating, genital sores, vaginal pain, menstrual problem and pelvic pain.  Musculoskeletal: Negative for back pain, arthralgias and neck stiffness.  Skin: Negative for rash.  Neurological: Negative for dizziness, syncope, speech difficulty, weakness, light-headedness, numbness and headaches.  Hematological: Negative for adenopathy. Does not bruise/bleed easily.  Psychiatric/Behavioral: Negative for suicidal ideas, behavioral problems, self-injury, dysphoric mood and agitation. The patient is not nervous/anxious.        Objective:   Physical Exam  Constitutional: She is oriented to person, place, and time. She appears well-developed and well-nourished.  HENT:  Head: Normocephalic and atraumatic.  Right Ear: External ear normal.  Left Ear: External ear normal.  Mouth/Throat: Oropharynx is clear and moist.  Eyes: Conjunctivae and EOM are normal.  Neck: Normal range of motion. Neck supple. No JVD present. No thyromegaly present.  Cardiovascular: Normal rate, regular rhythm and intact distal pulses.   Murmur heard. Grade 6-9/4 systolic murmur.  No definite murmur of AI noted today  Pedal pulses full  Pulmonary/Chest: Effort normal and breath sounds normal. She has no wheezes. She has no rales.  Abdominal: Soft. Bowel sounds are normal. She exhibits no distension and no mass. There is no tenderness. There is no rebound and no guarding.  Lower midline surgical scar  Musculoskeletal: Normal range of motion. She exhibits no edema or tenderness.  Neurological: She is alert and oriented to person, place, and time. She has normal reflexes. No cranial nerve deficit. She exhibits normal muscle tone. Coordination normal.  Skin: Skin is warm and dry. No rash noted.  Surgical scar left upper arm  Psychiatric: She has a normal mood and affect. Her behavior is normal.          Assessment & Plan:   Preventive health  examination Hypertension. blood pressure readings have been well controlled in general. We'll continue her present regimen; salt  restricted diet modest weight loss more regular exercise all encouraged  Impaired glucose tolerance Hypothyroidism  we'll continue present dose of Synthroid Colonic polyps  (followup colonoscopy performed in 2016) H/O melanoma. Close followup dermatology  Rule out renal insufficiency.  Creatinine is up to 1.3.  Patient is on diuretic therapy.  Will change Lasix to when necessary.  We'll recheck renal indices in 3 months.  Discontinue potassium supplement

## 2014-12-29 NOTE — Patient Instructions (Addendum)
Limit your sodium (Salt) intake  Furosemide once daily only if needed for significant swelling of the feet  Discontinue potassium supplement  Return in 3 months for follow-up  Please check your blood pressure on a regular basis.  If it is consistently greater than 150/90, please make an office appointment.

## 2014-12-29 NOTE — Progress Notes (Signed)
Pre visit review using our clinic review tool, if applicable. No additional management support is needed unless otherwise documented below in the visit note. 

## 2015-01-11 ENCOUNTER — Ambulatory Visit (INDEPENDENT_AMBULATORY_CARE_PROVIDER_SITE_OTHER): Payer: Commercial Managed Care - HMO | Admitting: Internal Medicine

## 2015-01-11 ENCOUNTER — Encounter: Payer: Self-pay | Admitting: Internal Medicine

## 2015-01-11 VITALS — BP 126/68 | HR 60 | Ht 62.5 in | Wt 185.5 lb

## 2015-01-11 DIAGNOSIS — K648 Other hemorrhoids: Secondary | ICD-10-CM

## 2015-01-11 NOTE — Progress Notes (Signed)
     PROCEDURE NOTE: The patient presents with symptomatic grade 2-3  hemorrhoids, requesting rubber band ligation of his/her hemorrhoidal disease.  All risks, benefits and alternative forms of therapy were described and informed consent was obtained.  She has had persistent fecal soiling despite banding of hemorrhoids in 2014. Rectal shows mod large fleshy tags all 3 positions. Decreased resting tone.  In the Left Lateral Decubitus position anoscopic examination revealed grade 2-3 hemorrhoids in all position(s).   The anorectum was pre-medicated with lidocaine and NTG The decision was made to band the RP, LL and RA i internal hemorrhoids, and the [place] was used to perform band ligation without complication.  Digital anorectal examination was then performed to assure proper positioning of the band, and to adjust the banded tissue as required.  The patient was discharged home without pain or other issues.  Dietary and behavioral recommendations were given and along with follow-up instructions.      The patient will return in as needed for  follow-up and possible additional banding as required. No complications were encountered and the patient tolerated the procedure well.

## 2015-01-11 NOTE — Assessment & Plan Note (Signed)
Still w/some fecal soiling - 3 bands placed LL, RA and RP. RTC prn

## 2015-01-11 NOTE — Patient Instructions (Addendum)
   HEMORRHOID BANDING PROCEDURE    FOLLOW-UP CARE   1. The procedure you have had should have been relatively painless since the banding of the area involved does not have nerve endings and there is no pain sensation.  The rubber band cuts off the blood supply to the hemorrhoid and the band may fall off as soon as 48 hours after the banding (the band may occasionally be seen in the toilet bowl following a bowel movement). You may notice a temporary feeling of fullness in the rectum which should respond adequately to plain Tylenol or Motrin.  2. Following the banding, avoid strenuous exercise that evening and resume full activity the next day.  A sitz bath (soaking in a warm tub) or bidet is soothing, and can be useful for cleansing the area after bowel movements.     3. To avoid constipation, take two tablespoons of natural wheat bran, natural oat bran, flax, Benefiber or any over the counter fiber supplement and increase your water intake to 7-8 glasses daily.    4. Unless you have been prescribed anorectal medication, do not put anything inside your rectum for two weeks: No suppositories, enemas, fingers, etc.  5. Occasionally, you may have more bleeding than usual after the banding procedure.  This is often from the untreated hemorrhoids rather than the treated one.  Don't be concerned if there is a tablespoon or so of blood.  If there is more blood than this, lie flat with your bottom higher than your head and apply an ice pack to the area. If the bleeding does not stop within a half an hour or if you feel faint, call our office at [phone] or go to the emergency room.  6. Problems are not common; however, if there is a substantial amount of bleeding, severe pain, chills, fever or difficulty passing urine (very rare) or other problems, you should call us at [phone] or report to the nearest emergency room.  7. Do not stay seated continuously for more than 2-3 hours for a  day or two after the procedure.  Tighten your buttock muscles 10-15 times every two hours and take 10-15 deep breaths every 1-2 hours.  Do not spend more than a few minutes on the toilet if you cannot empty your bowel; instead re-visit the toilet at a later time.     Follow up as needed.    I appreciate the opportunity to care for you. Silvano Rusk, MD, [facility]

## 2015-01-29 ENCOUNTER — Encounter: Payer: Self-pay | Admitting: Internal Medicine

## 2015-02-12 DIAGNOSIS — H5200 Hypermetropia, unspecified eye: Secondary | ICD-10-CM | POA: Diagnosis not present

## 2015-02-12 DIAGNOSIS — H25819 Combined forms of age-related cataract, unspecified eye: Secondary | ICD-10-CM | POA: Diagnosis not present

## 2015-02-12 DIAGNOSIS — H5210 Myopia, unspecified eye: Secondary | ICD-10-CM | POA: Diagnosis not present

## 2015-02-28 ENCOUNTER — Other Ambulatory Visit: Payer: Self-pay | Admitting: Internal Medicine

## 2015-03-20 ENCOUNTER — Other Ambulatory Visit: Payer: Self-pay | Admitting: Internal Medicine

## 2015-03-29 ENCOUNTER — Ambulatory Visit: Payer: Commercial Managed Care - HMO | Admitting: Internal Medicine

## 2015-03-30 ENCOUNTER — Ambulatory Visit: Payer: Commercial Managed Care - HMO | Admitting: Internal Medicine

## 2015-03-30 DIAGNOSIS — L814 Other melanin hyperpigmentation: Secondary | ICD-10-CM | POA: Diagnosis not present

## 2015-03-30 DIAGNOSIS — D1801 Hemangioma of skin and subcutaneous tissue: Secondary | ICD-10-CM | POA: Diagnosis not present

## 2015-03-30 DIAGNOSIS — D2262 Melanocytic nevi of left upper limb, including shoulder: Secondary | ICD-10-CM | POA: Diagnosis not present

## 2015-03-30 DIAGNOSIS — D225 Melanocytic nevi of trunk: Secondary | ICD-10-CM | POA: Diagnosis not present

## 2015-03-30 DIAGNOSIS — D224 Melanocytic nevi of scalp and neck: Secondary | ICD-10-CM | POA: Diagnosis not present

## 2015-03-30 DIAGNOSIS — Z85820 Personal history of malignant melanoma of skin: Secondary | ICD-10-CM | POA: Diagnosis not present

## 2015-03-30 DIAGNOSIS — D2272 Melanocytic nevi of left lower limb, including hip: Secondary | ICD-10-CM | POA: Diagnosis not present

## 2015-03-30 DIAGNOSIS — D2261 Melanocytic nevi of right upper limb, including shoulder: Secondary | ICD-10-CM | POA: Diagnosis not present

## 2015-04-04 ENCOUNTER — Encounter: Payer: Self-pay | Admitting: Internal Medicine

## 2015-04-04 ENCOUNTER — Ambulatory Visit (INDEPENDENT_AMBULATORY_CARE_PROVIDER_SITE_OTHER): Payer: Commercial Managed Care - HMO | Admitting: Internal Medicine

## 2015-04-04 VITALS — BP 140/80 | HR 62 | Temp 98.4°F | Ht 62.5 in | Wt 188.0 lb

## 2015-04-04 DIAGNOSIS — R7302 Impaired glucose tolerance (oral): Secondary | ICD-10-CM | POA: Diagnosis not present

## 2015-04-04 DIAGNOSIS — I10 Essential (primary) hypertension: Secondary | ICD-10-CM

## 2015-04-04 LAB — BASIC METABOLIC PANEL
BUN: 23 mg/dL (ref 6–23)
CHLORIDE: 100 meq/L (ref 96–112)
CO2: 29 meq/L (ref 19–32)
Calcium: 9.4 mg/dL (ref 8.4–10.5)
Creatinine, Ser: 1.16 mg/dL (ref 0.40–1.20)
GFR: 48.82 mL/min — AB (ref >60.00)
GLUCOSE: 148 mg/dL — AB (ref 70–99)
POTASSIUM: 4.1 meq/L (ref 3.5–5.1)
Sodium: 139 mEq/L (ref 135–145)

## 2015-04-04 LAB — HEMOGLOBIN A1C: Hgb A1c MFr Bld: 5.9 % (ref 4.6–6.5)

## 2015-04-04 NOTE — Patient Instructions (Signed)
Limit your sodium (Salt) intake  Please check your blood pressure on a regular basis.  If it is consistently greater than 150/90, please make an office appointment.    It is important that you exercise regularly, at least 20 minutes 3 to 4 times per week.  If you develop chest pain or shortness of breath seek  medical attention.  Return in 3 months for follow-up

## 2015-04-04 NOTE — Progress Notes (Signed)
Pre visit review using our clinic review tool, if applicable. No additional management support is needed unless otherwise documented below in the visit note. 

## 2015-04-04 NOTE — Progress Notes (Signed)
Subjective:    Patient ID: Jasmine Wheeler, female    DOB: 02-01-1946, 69 y.o.   MRN: [id number]  HPI  69 year old patient who has essential hypertension.  She also has a history of impaired glucose tolerance.  Laboratory update.  3 months ago revealed a elevated creatinine of 1.3.  She has some mild URI symptoms that are improving.  Otherwise, doing quite well.  Home blood pressure readings continue to be well controlled.  No other concerns or complaints.  Past Medical History  Diagnosis Date  . Hypertension   . Asthma   . Adenomatous colon polyp   . Hypothyroidism   . Chronic low back pain   . Diverticulosis 01/23/2010    left colon  . Heart murmur 2016    Social History   Social History  . Marital Status: Married    Spouse Name: N/A  . Number of Children: 3  . 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 Smoking status: Never Smoker   . Smokeless tobacco: Never Used  . Alcohol Use: No  . Drug Use: No  . Sexual Activity: Not on file   Other Topics Concern  . Not on file   Social History Narrative    Past Surgical History  Procedure Laterality Date  . Hemicolectomy  02/15/10    right, tubulovillous adenoma appendix, Dr. Georgette Dover  . Abdominal hysterectomy    . Repeat cesarean section      3 in all, last 1973  . Lumbar epidural injection  2011    x 2  . Colonoscopy w/ biopsies  multiple  . Hemorrhoid banding  2014  . Melanoma excision Left 2012    left arm, not sure about lymph nodes    Family History  Problem Relation Age of Onset  . Lung cancer Father   . Heart disease Father   . Colon cancer Mother   . Colon cancer Maternal Uncle   . Colon cancer Maternal Uncle     Allergies  Allergen Reactions  . Nifedipine     REACTION: unspecified  . Theophylline     REACTION: unspecified    Current Outpatient Prescriptions on File Prior to Visit  Medication Sig Dispense Refill  . albuterol (VENTOLIN  HFA) 108 (90 BASE) MCG/ACT inhaler Inhale 2 puffs into the lungs every 6 (six) hours as needed for wheezing or shortness of breath. 3 Inhaler 3  . Ascorbic Acid (VITAMIN C) 500 MG tablet daily. Take 2 tablet daily     . aspirin 81 MG EC tablet Take 81 mg by mouth daily.      . Calcium Carbonate-Vitamin D (CALCIUM-VITAMIN D) 500-200 MG-UNIT per tablet Take 1 tablet by mouth 2 (two) times daily with a meal.      . cetirizine (ZYRTEC) 10 MG tablet Take 10 mg by mouth daily.    . Cranberry 1000 MG CAPS Take by mouth. Take 2 tablets daily     . fish oil-omega-3 fatty acids 1000 MG capsule Take 2 g by mouth 2 (two) times daily.      . furosemide (LASIX) 40 MG tablet 1 tablet daily only if needed for swelling 90 tablet 1  . glucosamine-chondroitin 500-400 MG tablet Take 2 tablets by mouth daily.     . metoprolol succinate (TOPROL-XL) 50 MG 24 hr tablet TAKE 1 TABLET TWICE DAILY WITH OR IMMEDIATELY FOLLOWING MEALS 180 tablet 1  . Multiple Vitamin (MULTIVITAMIN)  tablet Take 1 tablet by mouth daily.      . Probiotic Product ([place]) Take by mouth daily.    . quinapril (ACCUPRIL) 40 MG tablet TAKE 1 TABLET TWICE DAILY 180 tablet 1  . spironolactone (ALDACTONE) 25 MG tablet TAKE 1 TABLET EVERY DAY 90 tablet 1  . SYNTHROID 150 MCG tablet Take 1 tablet (150 mcg total) by mouth daily before breakfast. 90 tablet 6  . TAZTIA XT 360 MG 24 hr capsule TAKE 1 CAPSULE EVERY DAY 90 capsule 1  . vitamin B-12 (CYANOCOBALAMIN) 500 MCG tablet Take 500 mcg by mouth daily.      . Wheat Dextrin (BENEFIBER PO) Take by mouth 1 day or 1 dose.    . [DISCONTINUED] diltiazem (CARDIZEM CD) 360 MG 24 hr capsule Take 1 capsule (360 mg total) by mouth daily. 90 capsule 3   No current facility-administered medications on file prior to visit.    BP 140/80 mmHg  Pulse 62  Temp(Src) 98.4 F (36.9 C) (Oral)  Ht 5' 2.5" (1.588 m)  Wt 188 lb (85.276 kg)  BMI 33.82 kg/m2  SpO2 96%     Review of Systems    Constitutional: Negative.   HENT: Positive for postnasal drip and voice change. Negative for congestion, dental problem, hearing loss, rhinorrhea, sinus pressure, sore throat and tinnitus.   Eyes: Negative for pain, discharge and visual disturbance.  Respiratory: Positive for cough. Negative for shortness of breath.   Cardiovascular: Negative for chest pain, palpitations and leg swelling.  Gastrointestinal: Negative for nausea, vomiting, abdominal pain, diarrhea, constipation, blood in stool and abdominal distention.  Genitourinary: Negative for dysuria, urgency, frequency, hematuria, flank pain, vaginal bleeding, vaginal discharge, difficulty urinating, vaginal pain and pelvic pain.  Musculoskeletal: Negative for joint swelling, arthralgias and gait problem.  Skin: Negative for rash.  Neurological: Negative for dizziness, syncope, speech difficulty, weakness, numbness and headaches.  Hematological: Negative for adenopathy.  Psychiatric/Behavioral: Negative for behavioral problems, dysphoric mood and agitation. The patient is not nervous/anxious.        Objective:   Physical Exam  Constitutional: She is oriented to person, place, and time. She appears well-developed and well-nourished.  Blood pressure 130/80  HENT:  Head: Normocephalic.  Right Ear: External ear normal.  Left Ear: External ear normal.  Mouth/Throat: Oropharynx is clear and moist.  Eyes: Conjunctivae and EOM are normal. Pupils are equal, round, and reactive to light.  Neck: Normal range of motion. Neck supple. No thyromegaly present.  Cardiovascular: Normal rate, regular rhythm, normal heart sounds and intact distal pulses.   Pulmonary/Chest: Effort normal and breath sounds normal. No respiratory distress. She has no wheezes. She has no rales.  Abdominal: Soft. Bowel sounds are normal. She exhibits no mass. There is no tenderness.  Musculoskeletal: Normal range of motion.  Lymphadenopathy:    She has no cervical  adenopathy.  Neurological: She is alert and oriented to person, place, and time.  Skin: Skin is warm and dry. No rash noted.  Psychiatric: She has a normal mood and affect. Her behavior is normal.          Assessment & Plan:   Viral URI Hypertension, well-controlled History of elevated creatinine.  Will recheck chemistries, BUN/creatinine Impaired glucose tolerance.  Check hemoglobin A1c  Recheck 3 months

## 2015-04-13 ENCOUNTER — Telehealth: Payer: Self-pay | Admitting: Internal Medicine

## 2015-04-13 MED ORDER — MECLIZINE HCL 25 MG PO TABS: 25.0000 mg | ORAL_TABLET | Freq: Three times a day (TID) | ORAL | Status: DC | PRN

## 2015-04-13 NOTE — Telephone Encounter (Signed)
Spoke to pt, told her Rx for Meclizine 25 mg one every 6 hours for dizziness was sent to pharmacy. Pt verbalized understanding.

## 2015-04-13 NOTE — Telephone Encounter (Signed)
Please see message and advise 

## 2015-04-13 NOTE — Telephone Encounter (Signed)
Meclizine 25 mg #60 one every 6 hours when necessary dizziness

## 2015-04-13 NOTE — Telephone Encounter (Signed)
Pt was seen on 12/14 and now has dizziness and would like rx generic ativan 25 mg sent to [facility] in [place]. Pt has taken some of her mother ativan 25 mg and experience some relief.

## 2015-04-23 ENCOUNTER — Other Ambulatory Visit: Payer: Self-pay | Admitting: Internal Medicine

## 2015-04-24 ENCOUNTER — Telehealth: Payer: Self-pay | Admitting: Internal Medicine

## 2015-04-24 NOTE — Telephone Encounter (Signed)
Pt has been sch

## 2015-04-24 NOTE — Telephone Encounter (Signed)
Pt is having vertigo and would like an appt. Can I use sda slot for tomorrow?

## 2015-04-24 NOTE — Telephone Encounter (Signed)
Yes, that is fine. Thanks. 

## 2015-04-25 ENCOUNTER — Encounter: Payer: Self-pay | Admitting: Internal Medicine

## 2015-04-25 ENCOUNTER — Ambulatory Visit (INDEPENDENT_AMBULATORY_CARE_PROVIDER_SITE_OTHER): Payer: Commercial Managed Care - HMO | Admitting: Internal Medicine

## 2015-04-25 VITALS — BP 150/80 | HR 67 | Temp 98.3°F | Resp 20 | Ht 62.5 in | Wt 190.0 lb

## 2015-04-25 DIAGNOSIS — I10 Essential (primary) hypertension: Secondary | ICD-10-CM

## 2015-04-25 DIAGNOSIS — H8110 Benign paroxysmal vertigo, unspecified ear: Secondary | ICD-10-CM

## 2015-04-25 MED ORDER — FLUTICASONE PROPIONATE 50 MCG/ACT NA SUSP: 2.0000 | Freq: Every day | NASAL | Status: DC

## 2015-04-25 NOTE — Progress Notes (Signed)
Pre visit review using our clinic review tool, if applicable. No additional management support is needed unless otherwise documented below in the visit note. 

## 2015-04-25 NOTE — Progress Notes (Signed)
Subjective:    Patient ID: Jasmine Wheeler, female    DOB: [birth year], 70 y.o.   MRN: [id number]  HPI  70 year old patient who presents with a two-week history of mild vertigo associated with head movement. She's also developed some sinus congestion, postnasal drip as well as some itching and exudate from the right eye She has been on chronic Zyrtec and when necessary meclizine. Denies any tinnitus or hearing loss No brainstem symptoms  Past Medical History  Diagnosis Date  . Hypertension   . Asthma   . Adenomatous colon polyp   . Hypothyroidism   . Chronic low back pain   . Diverticulosis 01/23/2010    left colon  . Heart murmur 2016    Social History   Social History  . Marital Status: Married    Spouse Name: N/A  . Number of Children: 3  . 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 Smoking status: Never Smoker   . Smokeless tobacco: Never Used  . Alcohol Use: No  . Drug Use: No  . Sexual Activity: Not on file   Other Topics Concern  . Not on file   Social History Narrative    Past Surgical History  Procedure Laterality Date  . Hemicolectomy  02/15/10    right, tubulovillous adenoma appendix, Dr. Georgette Dover  . Abdominal hysterectomy    . Repeat cesarean section      3 in all, last 1973  . Lumbar epidural injection  2011    x 2  . Colonoscopy w/ biopsies  multiple  . Hemorrhoid banding  2014  . Melanoma excision Left 2012    left arm, not sure about lymph nodes    Family History  Problem Relation Age of Onset  . Lung cancer Father   . Heart disease Father   . Colon cancer Mother   . Colon cancer Maternal Uncle   . Colon cancer Maternal Uncle     Allergies  Allergen Reactions  . Nifedipine     REACTION: unspecified  . Theophylline     REACTION: unspecified    Current Outpatient Prescriptions on File Prior to Visit  Medication Sig Dispense Refill  . albuterol (VENTOLIN HFA) 108 (90 BASE)  MCG/ACT inhaler Inhale 2 puffs into the lungs every 6 (six) hours as needed for wheezing or shortness of breath. 3 Inhaler 3  . Ascorbic Acid (VITAMIN C) 500 MG tablet daily. Take 2 tablet daily     . aspirin 81 MG EC tablet Take 81 mg by mouth daily.      . Calcium Carbonate-Vitamin D (CALCIUM-VITAMIN D) 500-200 MG-UNIT per tablet Take 1 tablet by mouth 2 (two) times daily with a meal.      . cetirizine (ZYRTEC) 10 MG tablet Take 10 mg by mouth daily.    . Cranberry 1000 MG CAPS Take by mouth. Take 2 tablets daily     . diltiazem (TIAZAC) 360 MG 24 hr capsule TAKE 1 CAPSULE EVERY DAY 90 capsule 2  . fish oil-omega-3 fatty acids 1000 MG capsule Take 2 g by mouth 2 (two) times daily.      . furosemide (LASIX) 40 MG tablet 1 tablet daily only if needed for swelling 90 tablet 1  . glucosamine-chondroitin 500-400 MG tablet Take 2 tablets by mouth daily.     . meclizine (ANTIVERT) 25 MG tablet Take 1 tablet (25 mg total) by mouth 3 (three) times  daily as needed for dizziness. 60 tablet 0  . metoprolol succinate (TOPROL-XL) 50 MG 24 hr tablet TAKE 1 TABLET TWICE DAILY WITH OR IMMEDIATELY FOLLOWING MEALS 180 tablet 1  . Multiple Vitamin (MULTIVITAMIN) tablet Take 1 tablet by mouth daily.      . Probiotic Product ([place]) Take by mouth daily.    . quinapril (ACCUPRIL) 40 MG tablet TAKE 1 TABLET TWICE DAILY 180 tablet 1  . spironolactone (ALDACTONE) 25 MG tablet TAKE 1 TABLET EVERY DAY 90 tablet 1  . SYNTHROID 150 MCG tablet Take 1 tablet (150 mcg total) by mouth daily before breakfast. 90 tablet 6  . vitamin B-12 (CYANOCOBALAMIN) 500 MCG tablet Take 500 mcg by mouth daily.      . Wheat Dextrin (BENEFIBER PO) Take by mouth 1 day or 1 dose.    . [DISCONTINUED] diltiazem (CARDIZEM CD) 360 MG 24 hr capsule Take 1 capsule (360 mg total) by mouth daily. 90 capsule 3   No current facility-administered medications on file prior to visit.    BP 150/80 mmHg  Pulse 67  Temp(Src) 98.3 F  (36.8 C) (Oral)  Resp 20  Ht 5' 2.5" (1.588 m)  Wt 190 lb (86.183 kg)  BMI 34.18 kg/m2  SpO2 98%     Review of Systems  Constitutional: Negative.   HENT: Negative for congestion, dental problem, hearing loss, rhinorrhea, sinus pressure, sore throat and tinnitus.   Eyes: Negative for pain, discharge and visual disturbance.  Respiratory: Negative for cough and shortness of breath.   Cardiovascular: Negative for chest pain, palpitations and leg swelling.  Gastrointestinal: Negative for nausea, vomiting, abdominal pain, diarrhea, constipation, blood in stool and abdominal distention.  Genitourinary: Negative for dysuria, urgency, frequency, hematuria, flank pain, vaginal bleeding, vaginal discharge, difficulty urinating, vaginal pain and pelvic pain.  Musculoskeletal: Negative for joint swelling, arthralgias and gait problem.  Skin: Negative for rash.  Neurological: Positive for dizziness. Negative for syncope, speech difficulty, weakness, numbness and headaches.  Hematological: Negative for adenopathy.  Psychiatric/Behavioral: Negative for behavioral problems, dysphoric mood and agitation. The patient is not nervous/anxious.        Objective:   Physical Exam  Constitutional: She is oriented to person, place, and time. She appears well-developed and well-nourished.  HENT:  Head: Normocephalic.  Right Ear: External ear normal.  Left Ear: External ear normal.  Mouth/Throat: Oropharynx is clear and moist.  Eyes: Conjunctivae and EOM are normal. Pupils are equal, round, and reactive to light.  Neck: Normal range of motion. Neck supple. No thyromegaly present.  Cardiovascular: Normal rate, regular rhythm, normal heart sounds and intact distal pulses.   Pulmonary/Chest: Effort normal and breath sounds normal.  Abdominal: Soft. Bowel sounds are normal. She exhibits no mass. There is no tenderness.  Musculoskeletal: Normal range of motion.  Lymphadenopathy:    She has no cervical  adenopathy.  Neurological: She is alert and oriented to person, place, and time. She has normal reflexes. No cranial nerve deficit. Coordination normal.  Head impulse (thrust) test negative  Normal gait and finger-to-nose testing  Skin: Skin is warm and dry. No rash noted.  Psychiatric: She has a normal mood and affect. Her behavior is normal.          Assessment & Plan:   Suspect benign positional vertigo.  We'll continue present therapy.  Was given information concerning repositioning maneuvers for self treatment of benign positional vertigo Hypertension, stable

## 2015-04-25 NOTE — Patient Instructions (Signed)
Benign Positional Vertigo Vertigo is the feeling that you or your surroundings are moving when they are not. Benign positional vertigo is the most common form of vertigo. The cause of this condition is not serious (is benign). This condition is triggered by certain movements and positions (is positional). This condition can be dangerous if it occurs while you are doing something that could endanger you or others, such as driving.  CAUSES In many cases, the cause of this condition is not known. It may be caused by a disturbance in an area of the inner ear that helps your brain to sense movement and balance. This disturbance can be caused by a viral infection (labyrinthitis), head injury, or repetitive motion. RISK FACTORS This condition is more likely to develop in:  Women.  People who are 72 years of age or older. SYMPTOMS Symptoms of this condition usually happen when you move your head or your eyes in different directions. Symptoms may start suddenly, and they usually last for less than a minute. Symptoms may include:  Loss of balance and falling.  Feeling like you are spinning or moving.  Feeling like your surroundings are spinning or moving.  Nausea and vomiting.  Blurred vision.  Dizziness.  Involuntary eye movement (nystagmus). Symptoms can be mild and cause only slight annoyance, or they can be severe and interfere with daily life. Episodes of benign positional vertigo may return (recur) over time, and they may be triggered by certain movements. Symptoms may improve over time. DIAGNOSIS This condition is usually diagnosed by medical history and a physical exam of the head, neck, and ears. You may be referred to a health care provider who specializes in ear, nose, and throat (ENT) problems (otolaryngologist) or a provider who specializes in disorders of the nervous system (neurologist). You may have additional testing, including:  MRI.  A CT scan.  Eye movement tests. Your  health care provider may ask you to change positions quickly while he or she watches you for symptoms of benign positional vertigo, such as nystagmus. Eye movement may be tested with an electronystagmogram (ENG), caloric stimulation, the Dix-Hallpike test, or the roll test.  An electroencephalogram (EEG). This records electrical activity in your brain.  Hearing tests. TREATMENT Usually, your health care provider will treat this by moving your head in specific positions to adjust your inner ear back to normal. Surgery may be needed in severe cases, but this is rare. In some cases, benign positional vertigo may resolve on its own in 2-4 weeks. HOME CARE INSTRUCTIONS Safety  Move slowly.Avoid sudden body or head movements.  Avoid driving.  Avoid operating heavy machinery.  Avoid doing any tasks that would be dangerous to you or others if a vertigo episode would occur.  If you have trouble walking or keeping your balance, try using a cane for stability. If you feel dizzy or unstable, sit down right away.  Return to your normal activities as told by your health care provider. Ask your health care provider what activities are safe for you. General Instructions  Take over-the-counter and prescription medicines only as told by your health care provider.  Avoid certain positions or movements as told by your health care provider.  Drink enough fluid to keep your urine clear or pale yellow.  Keep all follow-up visits as told by your health care provider. This is important. SEEK MEDICAL CARE IF:  You have a fever.  Your condition gets worse or you develop new symptoms.  Your family or friends   notice any behavioral changes.  Your nausea or vomiting gets worse.  You have numbness or a "pins and needles" sensation. SEEK IMMEDIATE MEDICAL CARE IF:  You have difficulty speaking or moving.  You are always dizzy.  You faint.  You develop severe headaches.  You have weakness in your  legs or arms.  You have changes in your hearing or vision.  You develop a stiff neck.  You develop sensitivity to light.   This information is not intended to replace advice given to you by your health care provider. Make sure you discuss any questions you have with your health care provider.   Document Released: 01/13/2006 Document Revised: 12/27/2014 Document Reviewed: 07/31/2014 Elsevier Interactive Patient Education 2016 [employer]  

## 2015-05-10 ENCOUNTER — Other Ambulatory Visit: Payer: Self-pay | Admitting: Rheumatology

## 2015-05-10 DIAGNOSIS — R5381 Other malaise: Secondary | ICD-10-CM

## 2015-05-14 ENCOUNTER — Other Ambulatory Visit: Payer: Self-pay | Admitting: Internal Medicine

## 2015-05-23 ENCOUNTER — Other Ambulatory Visit: Payer: Self-pay | Admitting: Internal Medicine

## 2015-05-31 ENCOUNTER — Other Ambulatory Visit: Payer: Self-pay

## 2015-05-31 ENCOUNTER — Other Ambulatory Visit: Payer: Self-pay | Admitting: Obstetrics & Gynecology

## 2015-05-31 DIAGNOSIS — E2839 Other primary ovarian failure: Secondary | ICD-10-CM

## 2015-05-31 DIAGNOSIS — Z1231 Encounter for screening mammogram for malignant neoplasm of breast: Secondary | ICD-10-CM

## 2015-06-21 ENCOUNTER — Ambulatory Visit
Admission: RE | Admit: 2015-06-21 | Discharge: 2015-06-21 | Disposition: A | Discharge status: Home / Self Care | Payer: Commercial Managed Care - HMO | Source: Ambulatory Visit | Attending: Obstetrics & Gynecology | Admitting: Obstetrics & Gynecology

## 2015-06-21 ENCOUNTER — Ambulatory Visit
Admission: RE | Admit: 2015-06-21 | Discharge: 2015-06-21 | Disposition: A | Discharge status: Home / Self Care | Payer: Commercial Managed Care - HMO | Source: Ambulatory Visit

## 2015-06-21 DIAGNOSIS — E2839 Other primary ovarian failure: Secondary | ICD-10-CM

## 2015-06-21 DIAGNOSIS — Z1231 Encounter for screening mammogram for malignant neoplasm of breast: Secondary | ICD-10-CM

## 2015-06-21 DIAGNOSIS — M85852 Other specified disorders of bone density and structure, left thigh: Secondary | ICD-10-CM | POA: Diagnosis not present

## 2015-07-03 ENCOUNTER — Ambulatory Visit: Payer: Commercial Managed Care - HMO | Admitting: Internal Medicine

## 2015-07-09 ENCOUNTER — Other Ambulatory Visit: Payer: Self-pay | Admitting: Internal Medicine

## 2015-07-10 ENCOUNTER — Encounter: Payer: Self-pay | Admitting: Internal Medicine

## 2015-07-10 ENCOUNTER — Ambulatory Visit (INDEPENDENT_AMBULATORY_CARE_PROVIDER_SITE_OTHER): Payer: Commercial Managed Care - HMO | Admitting: Internal Medicine

## 2015-07-10 VITALS — BP 180/90 | HR 64 | Temp 98.2°F | Resp 20 | Ht 62.5 in | Wt 191.0 lb

## 2015-07-10 DIAGNOSIS — J4520 Mild intermittent asthma, uncomplicated: Secondary | ICD-10-CM | POA: Diagnosis not present

## 2015-07-10 DIAGNOSIS — I10 Essential (primary) hypertension: Secondary | ICD-10-CM | POA: Diagnosis not present

## 2015-07-10 NOTE — Progress Notes (Signed)
Pre visit review using our clinic review tool, if applicable. No additional management support is needed unless otherwise documented below in the visit note. 

## 2015-07-10 NOTE — Patient Instructions (Signed)
Limit your sodium (Salt) intake    It is important that you exercise regularly, at least 20 minutes 3 to 4 times per week.  If you develop chest pain or shortness of breath seek  medical attention.  You need to lose weight.  Consider a lower calorie diet and regular exercise.  Please check your blood pressure on a regular basis.  If it is consistently greater than 150/90, please make an office appointment.

## 2015-07-10 NOTE — Progress Notes (Signed)
Subjective:    Patient ID: Jasmine Wheeler, female    DOB: 13-Feb-1946, 70 y.o.   MRN: [id number]  HPI  BP Readings from Last 3 Encounters:  07/10/15 180/90  04/25/15 150/80  04/04/15 140/80    Wt Readings from Last 3 Encounters:  07/10/15 191 lb (86.637 kg)  04/25/15 190 lb (86.183 kg)  04/04/15 188 lb (85.8 kg)   70 year old patient who has essential hypertension.  She was seen a couple months ago with some positional vertigo.  This has improved but is still problematic intermittently.  She has hypothyroidism and a history of impaired glucose tolerance.  Last hemoglobin A1c was in a nondiabetic range.  Asthma and allergies fairly stable.  Past Medical History  Diagnosis Date  . Hypertension   . Asthma   . Adenomatous colon polyp   . Hypothyroidism   . Chronic low back pain   . Diverticulosis 01/23/2010    left colon  . Heart murmur 2016    Social History   Social History  . Marital Status: Married    Spouse Name: N/A  . Number of Children: 3  . 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 Smoking status: Never Smoker   . Smokeless tobacco: Never Used  . Alcohol Use: No  . Drug Use: No  . Sexual Activity: Not on file   Other Topics Concern  . Not on file   Social History Narrative    Past Surgical History  Procedure Laterality Date  . Hemicolectomy  02/15/10    right, tubulovillous adenoma appendix, Dr. Georgette Dover  . Abdominal hysterectomy    . Repeat cesarean section      3 in all, last 1973  . Lumbar epidural injection  2011    x 2  . Colonoscopy w/ biopsies  multiple  . Hemorrhoid banding  2014  . Melanoma excision Left 2012    left arm, not sure about lymph nodes    Family History  Problem Relation Age of Onset  . Lung cancer Father   . Heart disease Father   . Colon cancer Mother   . Colon cancer Maternal Uncle   . Colon cancer Maternal Uncle     Allergies  Allergen Reactions  .  Nifedipine     REACTION: unspecified  . Theophylline     REACTION: unspecified    Current Outpatient Prescriptions on File Prior to Visit  Medication Sig Dispense Refill  . albuterol (VENTOLIN HFA) 108 (90 BASE) MCG/ACT inhaler Inhale 2 puffs into the lungs every 6 (six) hours as needed for wheezing or shortness of breath. 3 Inhaler 3  . Ascorbic Acid (VITAMIN C) 500 MG tablet daily. Take 2 tablet daily     . aspirin 81 MG EC tablet Take 81 mg by mouth daily.      . Calcium Carbonate-Vitamin D (CALCIUM-VITAMIN D) 500-200 MG-UNIT per tablet Take 1 tablet by mouth 2 (two) times daily with a meal.      . cetirizine (ZYRTEC) 10 MG tablet Take 10 mg by mouth daily.    . Cranberry 1000 MG CAPS Take by mouth. Take 2 tablets daily     . diltiazem (TIAZAC) 360 MG 24 hr capsule TAKE 1 CAPSULE EVERY DAY 90 capsule 2  . fish oil-omega-3 fatty acids 1000 MG capsule Take 2 g by mouth 2 (two) times daily.      . fluticasone (FLONASE) 50 MCG/ACT nasal  spray Place 2 sprays into both nostrils daily. 16 g 6  . furosemide (LASIX) 40 MG tablet TAKE 1 TABLET DAILY ONLY IF NEEDED FOR SWELLING 90 tablet 1  . glucosamine-chondroitin 500-400 MG tablet Take 2 tablets by mouth daily.     . meclizine (ANTIVERT) 25 MG tablet TAKE ONE TABLET BY MOUTH THREE TIMES DAILY AS NEEDED FOR DIZZINESS 60 tablet 0  . metoprolol succinate (TOPROL-XL) 50 MG 24 hr tablet TAKE 1 TABLET TWICE DAILY WITH OR IMMEDIATELY FOLLOWING MEALS 180 tablet 1  . Multiple Vitamin (MULTIVITAMIN) tablet Take 1 tablet by mouth daily.      . Probiotic Product ([place]) Take by mouth daily.    . quinapril (ACCUPRIL) 40 MG tablet TAKE 1 TABLET TWICE DAILY 180 tablet 1  . spironolactone (ALDACTONE) 25 MG tablet TAKE 1 TABLET EVERY DAY 90 tablet 1  . SYNTHROID 150 MCG tablet Take 1 tablet (150 mcg total) by mouth daily before breakfast. 90 tablet 6  . vitamin B-12 (CYANOCOBALAMIN) 500 MCG tablet Take 500 mcg by mouth daily.      . Wheat  Dextrin (BENEFIBER PO) Take by mouth 1 day or 1 dose.    . [DISCONTINUED] diltiazem (CARDIZEM CD) 360 MG 24 hr capsule Take 1 capsule (360 mg total) by mouth daily. 90 capsule 3   No current facility-administered medications on file prior to visit.    BP 180/90 mmHg  Pulse 64  Temp(Src) 98.2 F (36.8 C) (Oral)  Resp 20  Ht 5' 2.5" (1.588 m)  Wt 191 lb (86.637 kg)  BMI 34.36 kg/m2  SpO2 96%      Review of Systems  Constitutional: Negative.   HENT: Positive for congestion. Negative for dental problem, hearing loss, rhinorrhea, sinus pressure, sore throat and tinnitus.   Eyes: Negative for pain, discharge and visual disturbance.  Respiratory: Negative for cough and shortness of breath.   Cardiovascular: Negative for chest pain, palpitations and leg swelling.  Gastrointestinal: Negative for nausea, vomiting, abdominal pain, diarrhea, constipation, blood in stool and abdominal distention.  Genitourinary: Negative for dysuria, urgency, frequency, hematuria, flank pain, vaginal bleeding, vaginal discharge, difficulty urinating, vaginal pain and pelvic pain.  Musculoskeletal: Negative for joint swelling, arthralgias and gait problem.  Skin: Negative for rash.  Neurological: Positive for dizziness. Negative for syncope, speech difficulty, weakness, numbness and headaches.  Hematological: Negative for adenopathy.  Psychiatric/Behavioral: Negative for behavioral problems, dysphoric mood and agitation. The patient is not nervous/anxious.        Objective:   Physical Exam  Constitutional: She is oriented to person, place, and time. She appears well-developed and well-nourished.  Weight 191 Repeat blood pressure right arm 158 over 82 Repeat blood pressure left arm 150 over 78  HENT:  Head: Normocephalic.  Right Ear: External ear normal.  Left Ear: External ear normal.  Mouth/Throat: Oropharynx is clear and moist.  Eyes: Conjunctivae and EOM are normal. Pupils are equal, round, and  reactive to light.  Neck: Normal range of motion. Neck supple. No thyromegaly present.  Cardiovascular: Normal rate, regular rhythm, normal heart sounds and intact distal pulses.   Pulmonary/Chest: Effort normal and breath sounds normal. She has no wheezes.  Abdominal: Soft. Bowel sounds are normal. She exhibits no mass. There is no tenderness.  Musculoskeletal: Normal range of motion.  Lymphadenopathy:    She has no cervical adenopathy.  Neurological: She is alert and oriented to person, place, and time.  Skin: Skin is warm and dry. No rash noted.  Psychiatric: She has a normal mood and affect. Her behavior is normal.          Assessment & Plan:   Hypertension.  Will continue home blood pressure monitoring.  No change in therapy at this time Asthma, stable Positional vertigo, improving Obesity.  Patient seems motivated to exercise more, lose weight. Impaired glucose tolerance.  We'll check hemoglobin A1c next visit  Recheck 3 months

## 2015-08-02 ENCOUNTER — Other Ambulatory Visit: Payer: Self-pay | Admitting: Internal Medicine

## 2015-08-05 ENCOUNTER — Other Ambulatory Visit: Payer: Self-pay | Admitting: Internal Medicine

## 2015-08-10 ENCOUNTER — Ambulatory Visit (INDEPENDENT_AMBULATORY_CARE_PROVIDER_SITE_OTHER): Payer: Commercial Managed Care - HMO | Admitting: Internal Medicine

## 2015-08-10 ENCOUNTER — Encounter: Payer: Self-pay | Admitting: Internal Medicine

## 2015-08-10 VITALS — BP 138/86 | HR 63 | Temp 98.9°F | Resp 20 | Ht 62.5 in | Wt 188.0 lb

## 2015-08-10 DIAGNOSIS — E039 Hypothyroidism, unspecified: Secondary | ICD-10-CM

## 2015-08-10 DIAGNOSIS — J4520 Mild intermittent asthma, uncomplicated: Secondary | ICD-10-CM

## 2015-08-10 DIAGNOSIS — I10 Essential (primary) hypertension: Secondary | ICD-10-CM

## 2015-08-10 DIAGNOSIS — J301 Allergic rhinitis due to pollen: Secondary | ICD-10-CM

## 2015-08-10 MED ORDER — PREDNISONE 20 MG PO TABS: ORAL_TABLET | ORAL | Status: DC

## 2015-08-10 NOTE — Patient Instructions (Signed)
HOME CARE INSTRUCTIONS  Drink plenty of water. Water helps thin the mucus so your sinuses can drain more easily.  Use a humidifier.  Inhale steam 3-4 times a day (for example, sit in the bathroom with the shower running).  Apply a warm, moist washcloth to your face 3-4 times a day, or as directed by your health care provider.  Use saline nasal sprays to help moisten and clean your sinuses.  Take medicines only as directed by your health care provider.

## 2015-08-10 NOTE — Progress Notes (Signed)
Subjective:    Patient ID: Jasmine Wheeler, female    DOB: [birth year], 70 y.o.   MRN: [id number]  HPI  70 year old patient has a history of asthma and allergic rhinitis.  She has had exacerbations of symptoms for the past 7 days with cough, postnasal drip, sore throat and some episodic wheezing.  She is worsening sinus congestion.  She has been using guaifenesin, Flonase and Zyrtec.  No fever  Past Medical History  Diagnosis Date  . Hypertension   . Asthma   . Adenomatous colon polyp   . Hypothyroidism   . Chronic low back pain   . Diverticulosis 01/23/2010    left colon  . Heart murmur 2016     Social History   Social History  . Marital Status: Married    Spouse Name: N/A  . Number of Children: 3  . 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 Smoking status: Never Smoker   . Smokeless tobacco: Never Used  . Alcohol Use: No  . Drug Use: No  . Sexual Activity: Not on file   Other Topics Concern  . Not on file   Social History Narrative    Past Surgical History  Procedure Laterality Date  . Hemicolectomy  02/15/10    right, tubulovillous adenoma appendix, Dr. Georgette Dover  . Abdominal hysterectomy    . Repeat cesarean section      3 in all, last 1973  . Lumbar epidural injection  2011    x 2  . Colonoscopy w/ biopsies  multiple  . Hemorrhoid banding  2014  . Melanoma excision Left 2012    left arm, not sure about lymph nodes    Family History  Problem Relation Age of Onset  . Lung cancer Father   . Heart disease Father   . Colon cancer Mother   . Colon cancer Maternal Uncle   . Colon cancer Maternal Uncle     Allergies  Allergen Reactions  . Nifedipine     REACTION: unspecified  . Theophylline     REACTION: unspecified    Current Outpatient Prescriptions on File Prior to Visit  Medication Sig Dispense Refill  . Ascorbic Acid (VITAMIN C) 500 MG tablet daily. Take 2 tablet daily     . aspirin 81  MG EC tablet Take 81 mg by mouth daily.      . Calcium Carbonate-Vitamin D (CALCIUM-VITAMIN D) 500-200 MG-UNIT per tablet Take 1 tablet by mouth 2 (two) times daily with a meal.      . cetirizine (ZYRTEC) 10 MG tablet Take 10 mg by mouth daily.    . Cranberry 1000 MG CAPS Take by mouth. Take 2 tablets daily     . diltiazem (TIAZAC) 360 MG 24 hr capsule TAKE 1 CAPSULE EVERY DAY 90 capsule 2  . fish oil-omega-3 fatty acids 1000 MG capsule Take 2 g by mouth 2 (two) times daily.      . fluticasone (FLONASE) 50 MCG/ACT nasal spray Place 2 sprays into both nostrils daily. 16 g 6  . furosemide (LASIX) 40 MG tablet TAKE 1 TABLET DAILY ONLY IF NEEDED FOR SWELLING 90 tablet 1  . glucosamine-chondroitin 500-400 MG tablet Take 2 tablets by mouth daily.     . meclizine (ANTIVERT) 25 MG tablet TAKE ONE TABLET BY MOUTH THREE TIMES DAILY AS NEEDED FOR DIZZINESS 60 tablet 0  . metoprolol succinate (TOPROL-XL) 50 MG 24 hr tablet  TAKE 1 TABLET TWICE DAILY WITH OR IMMEDIATELY FOLLOWING MEALS 180 tablet 1  . Multiple Vitamin (MULTIVITAMIN) tablet Take 1 tablet by mouth daily.      . Probiotic Product ([place]) Take by mouth daily.    . quinapril (ACCUPRIL) 40 MG tablet TAKE 1 TABLET TWICE DAILY 180 tablet 1  . spironolactone (ALDACTONE) 25 MG tablet TAKE 1 TABLET EVERY DAY 90 tablet 1  . SYNTHROID 150 MCG tablet Take 1 tablet (150 mcg total) by mouth daily before breakfast. 90 tablet 6  . VENTOLIN HFA 108 (90 Base) MCG/ACT inhaler INHALE 2 PUFFS EVERY SIX HOURS AS NEEDED FOR WHEEZING OR SHORTNESS OF BREATH 54 g 3  . vitamin B-12 (CYANOCOBALAMIN) 500 MCG tablet Take 500 mcg by mouth daily.      . Wheat Dextrin (BENEFIBER PO) Take by mouth 1 day or 1 dose.    . [DISCONTINUED] diltiazem (CARDIZEM CD) 360 MG 24 hr capsule Take 1 capsule (360 mg total) by mouth daily. 90 capsule 3   No current facility-administered medications on file prior to visit.    BP 138/86 mmHg  Pulse 63  Temp(Src) 98.9 F  (37.2 C) (Oral)  Resp 20  Ht 5' 2.5" (1.588 m)  Wt 188 lb (85.276 kg)  BMI 33.82 kg/m2  SpO2 96%     Review of Systems  Constitutional: Positive for activity change.  HENT: Positive for congestion, postnasal drip, rhinorrhea and sinus pressure. Negative for dental problem, hearing loss, sore throat and tinnitus.   Eyes: Negative for pain, discharge and visual disturbance.  Respiratory: Positive for cough and wheezing. Negative for shortness of breath.   Cardiovascular: Negative for chest pain, palpitations and leg swelling.  Gastrointestinal: Negative for nausea, vomiting, abdominal pain, diarrhea, constipation, blood in stool and abdominal distention.  Genitourinary: Negative for dysuria, urgency, frequency, hematuria, flank pain, vaginal bleeding, vaginal discharge, difficulty urinating, vaginal pain and pelvic pain.  Musculoskeletal: Negative for joint swelling, arthralgias and gait problem.  Skin: Negative for rash.  Neurological: Negative for dizziness, syncope, speech difficulty, weakness, numbness and headaches.  Hematological: Negative for adenopathy.  Psychiatric/Behavioral: Negative for behavioral problems, dysphoric mood and agitation. The patient is not nervous/anxious.        Objective:   Physical Exam  Constitutional: She is oriented to person, place, and time. She appears well-developed and well-nourished.  HENT:  Head: Normocephalic.  Right Ear: External ear normal.  Left Ear: External ear normal.  Oropharynx injected.  No cervical adenopathy.  Eyes: Conjunctivae and EOM are normal. Pupils are equal, round, and reactive to light.  Neck: Normal range of motion. Neck supple. No thyromegaly present.  Cardiovascular: Normal rate, regular rhythm, normal heart sounds and intact distal pulses.   Pulmonary/Chest: Effort normal and breath sounds normal.  Abdominal: Soft. Bowel sounds are normal. She exhibits no mass. There is no tenderness.  Musculoskeletal: Normal  range of motion.  Lymphadenopathy:    She has no cervical adenopathy.  Neurological: She is alert and oriented to person, place, and time.  Skin: Skin is warm and dry. No rash noted.  Psychiatric: She has a normal mood and affect. Her behavior is normal.          Assessment & Plan:   Flare allergic rhinitis Asthma, stable Hypertension, controlled  We'll continue present maintenance medications.  Will place on a prednisone Dosepak

## 2015-08-10 NOTE — Progress Notes (Signed)
Pre visit review using our clinic review tool, if applicable. No additional management support is needed unless otherwise documented below in the visit note. 

## 2015-08-29 ENCOUNTER — Other Ambulatory Visit: Payer: Self-pay | Admitting: Internal Medicine

## 2015-09-26 ENCOUNTER — Telehealth: Payer: Self-pay | Admitting: Internal Medicine

## 2015-09-26 NOTE — Telephone Encounter (Signed)
Error/ltd ° °

## 2015-09-28 DIAGNOSIS — D2272 Melanocytic nevi of left lower limb, including hip: Secondary | ICD-10-CM | POA: Diagnosis not present

## 2015-09-28 DIAGNOSIS — D224 Melanocytic nevi of scalp and neck: Secondary | ICD-10-CM | POA: Diagnosis not present

## 2015-09-28 DIAGNOSIS — Z85820 Personal history of malignant melanoma of skin: Secondary | ICD-10-CM | POA: Diagnosis not present

## 2015-09-28 DIAGNOSIS — D225 Melanocytic nevi of trunk: Secondary | ICD-10-CM | POA: Diagnosis not present

## 2015-09-28 DIAGNOSIS — D1801 Hemangioma of skin and subcutaneous tissue: Secondary | ICD-10-CM | POA: Diagnosis not present

## 2015-09-28 DIAGNOSIS — D2262 Melanocytic nevi of left upper limb, including shoulder: Secondary | ICD-10-CM | POA: Diagnosis not present

## 2015-09-28 DIAGNOSIS — L814 Other melanin hyperpigmentation: Secondary | ICD-10-CM | POA: Diagnosis not present

## 2015-09-28 DIAGNOSIS — D2271 Melanocytic nevi of right lower limb, including hip: Secondary | ICD-10-CM | POA: Diagnosis not present

## 2015-10-10 ENCOUNTER — Ambulatory Visit (INDEPENDENT_AMBULATORY_CARE_PROVIDER_SITE_OTHER): Payer: Commercial Managed Care - HMO | Admitting: Internal Medicine

## 2015-10-10 ENCOUNTER — Encounter: Payer: Self-pay | Admitting: Internal Medicine

## 2015-10-10 VITALS — BP 146/90 | HR 66 | Temp 98.2°F | Resp 20 | Ht 62.5 in | Wt 189.0 lb

## 2015-10-10 DIAGNOSIS — I10 Essential (primary) hypertension: Secondary | ICD-10-CM

## 2015-10-10 DIAGNOSIS — E039 Hypothyroidism, unspecified: Secondary | ICD-10-CM | POA: Diagnosis not present

## 2015-10-10 NOTE — Patient Instructions (Signed)
Limit your sodium (Salt) intake  Please check your blood pressure on a regular basis.  If it is consistently greater than 150/90, please make an office appointment.  Return in 4 months for follow-up  

## 2015-10-10 NOTE — Progress Notes (Signed)
Pre visit review using our clinic review tool, if applicable. No additional management support is needed unless otherwise documented below in the visit note. 
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
## 2015-11-28 ENCOUNTER — Other Ambulatory Visit: Payer: Self-pay | Admitting: Internal Medicine

## 2015-12-18 ENCOUNTER — Other Ambulatory Visit: Payer: Self-pay | Admitting: Internal Medicine

## 2015-12-18 DIAGNOSIS — R609 Edema, unspecified: Secondary | ICD-10-CM

## 2015-12-18 DIAGNOSIS — M25561 Pain in right knee: Secondary | ICD-10-CM

## 2015-12-20 DIAGNOSIS — M25561 Pain in right knee: Secondary | ICD-10-CM | POA: Diagnosis not present

## 2016-01-15 ENCOUNTER — Ambulatory Visit (INDEPENDENT_AMBULATORY_CARE_PROVIDER_SITE_OTHER): Payer: Commercial Managed Care - HMO | Admitting: *Deleted

## 2016-01-15 DIAGNOSIS — Z23 Encounter for immunization: Secondary | ICD-10-CM

## 2016-01-28 ENCOUNTER — Other Ambulatory Visit: Payer: Self-pay | Admitting: Internal Medicine

## 2016-02-12 DIAGNOSIS — M25561 Pain in right knee: Secondary | ICD-10-CM | POA: Diagnosis not present

## 2016-02-19 DIAGNOSIS — M25561 Pain in right knee: Secondary | ICD-10-CM | POA: Diagnosis not present

## 2016-02-26 DIAGNOSIS — M25561 Pain in right knee: Secondary | ICD-10-CM | POA: Diagnosis not present

## 2016-03-04 ENCOUNTER — Encounter: Payer: Self-pay | Admitting: Internal Medicine

## 2016-03-04 ENCOUNTER — Ambulatory Visit (INDEPENDENT_AMBULATORY_CARE_PROVIDER_SITE_OTHER): Payer: Commercial Managed Care - HMO | Admitting: Internal Medicine

## 2016-03-04 VITALS — BP 126/80 | HR 60 | Temp 98.7°F | Resp 20 | Ht 62.5 in | Wt 192.0 lb

## 2016-03-04 DIAGNOSIS — E039 Hypothyroidism, unspecified: Secondary | ICD-10-CM

## 2016-03-04 DIAGNOSIS — Z86010 Personal history of colonic polyps: Secondary | ICD-10-CM | POA: Diagnosis not present

## 2016-03-04 DIAGNOSIS — R7302 Impaired glucose tolerance (oral): Secondary | ICD-10-CM | POA: Diagnosis not present

## 2016-03-04 DIAGNOSIS — Z0000 Encounter for general adult medical examination without abnormal findings: Secondary | ICD-10-CM

## 2016-03-04 DIAGNOSIS — J4520 Mild intermittent asthma, uncomplicated: Secondary | ICD-10-CM | POA: Diagnosis not present

## 2016-03-04 DIAGNOSIS — I10 Essential (primary) hypertension: Secondary | ICD-10-CM | POA: Diagnosis not present

## 2016-03-04 LAB — COMPREHENSIVE METABOLIC PANEL
ALBUMIN: 4.2 g/dL (ref 3.5–5.2)
ALK PHOS: 84 U/L (ref 39–117)
ALT: 10 U/L (ref 0–35)
AST: 10 U/L (ref 0–37)
BILIRUBIN TOTAL: 0.4 mg/dL (ref 0.2–1.2)
BUN: 25 mg/dL — ABNORMAL HIGH (ref 6–23)
CALCIUM: 9.5 mg/dL (ref 8.4–10.5)
CO2: 32 mEq/L (ref 19–32)
Chloride: 98 mEq/L (ref 96–112)
Creatinine, Ser: 1.23 mg/dL — ABNORMAL HIGH (ref 0.40–1.20)
GFR: 45.52 mL/min — AB (ref >60.00)
GLUCOSE: 111 mg/dL — AB (ref 70–99)
POTASSIUM: 4.2 meq/L (ref 3.5–5.1)
Sodium: 139 mEq/L (ref 135–145)
TOTAL PROTEIN: 6.3 g/dL (ref 6.0–8.3)

## 2016-03-04 LAB — CBC WITH DIFFERENTIAL/PLATELET
BASOS ABS: 0 10*3/uL (ref 0.0–0.1)
Basophils Relative: 0.2 % (ref 0.0–3.0)
EOS PCT: 2.8 % (ref 0.0–5.0)
Eosinophils Absolute: 0.1 10*3/uL (ref 0.0–0.7)
HCT: 35.2 % — ABNORMAL LOW (ref 36.0–46.0)
HEMOGLOBIN: 11.8 g/dL — AB (ref 12.0–15.0)
LYMPHS ABS: 1.2 10*3/uL (ref 0.7–4.0)
Lymphocytes Relative: 24.4 % (ref 12.0–46.0)
MCHC: 33.4 g/dL (ref 30.0–36.0)
MCV: 88.9 fl (ref 78.0–100.0)
MONOS PCT: 7.2 % (ref 3.0–12.0)
Monocytes Absolute: 0.3 10*3/uL (ref 0.1–1.0)
NEUTROS PCT: 65.4 % (ref 43.0–77.0)
Neutro Abs: 3.2 10*3/uL (ref 1.4–7.7)
Platelets: 282 10*3/uL (ref 150.0–400.0)
RBC: 3.96 Mil/uL (ref 3.87–5.11)
RDW: 14.5 % (ref 11.5–15.5)
WBC: 4.9 10*3/uL (ref 4.0–10.5)

## 2016-03-04 LAB — LIPID PANEL
Cholesterol: 223 mg/dL — ABNORMAL HIGH (ref 0–200)
HDL: 47.5 mg/dL (ref >39.00)
LDL Cholesterol: 143 mg/dL — ABNORMAL HIGH (ref 0–99)
NONHDL: 175.05
TRIGLYCERIDES: 160 mg/dL — AB (ref 0.0–149.0)
Total CHOL/HDL Ratio: 5
VLDL: 32 mg/dL (ref 0.0–40.0)

## 2016-03-04 LAB — TSH: TSH: 3.37 u[IU]/mL (ref 0.35–4.50)

## 2016-03-04 LAB — HEMOGLOBIN A1C: HEMOGLOBIN A1C: 6.3 % (ref 4.6–6.5)

## 2016-03-04 NOTE — Progress Notes (Signed)
Pre visit review using our clinic review tool, if applicable. No additional management support is needed unless otherwise documented below in the visit note. 

## 2016-03-04 NOTE — Progress Notes (Signed)
Subjective:    Patient ID: Jasmine Wheeler, female    DOB: 1946-03-06, 70 y.o.   MRN: [id number]  HPI  70 year old patient who is seen today for a preventive health examination She has a history of multi-drug-resistant hypertension which has been stable for a number of years on her present regimen.  She has impaired glucose tolerance.  Hypothyroidism.  She remains on supplemental levothyroxine.  Colonoscopy was performed last year She is scheduled for GYN follow-up after the first of the year.  She is seen annually by ophthalmology.  Has had a mammogram performed earlier this year as well as a bone density  No new concerns or complaints.  She has seen orthopedics recently for right knee pain that seems to be improving  Past Medical History:  Diagnosis Date  . Adenomatous colon polyp   . Asthma   . Chronic low back pain   . Diverticulosis 01/23/2010   left colon  . Heart murmur 2016  . Hypertension   . Hypothyroidism      Social History   Social History  . Marital status: Married    Spouse name: N/A  . Number of children: 3  . Years of education: N/A   Occupational History  . DEPT [employer]   Social History Main Topics  . Smoking status: Never Smoker  . Smokeless tobacco: Never Used  . Alcohol use No  . Drug use: No  . Sexual activity: Not on file   Other Topics Concern  . Not on file   Social History Narrative  . No narrative on file    Past Surgical History:  Procedure Laterality Date  . ABDOMINAL HYSTERECTOMY    . COLONOSCOPY W/ BIOPSIES  multiple  . HEMICOLECTOMY  02/15/10   right, tubulovillous adenoma appendix, Dr. Georgette Dover  . HEMORRHOID BANDING  2014  . LUMBAR EPIDURAL INJECTION  2011   x 2  . MELANOMA EXCISION Left 2012   left arm, not sure about lymph nodes  . REPEAT CESAREAN SECTION     3 in all, last 40    Family History  Problem Relation Age of Onset  . Lung cancer Father   . Heart disease Father   . Colon cancer Mother     . Colon cancer Maternal Uncle   . Colon cancer Maternal Uncle     Allergies  Allergen Reactions  . Nifedipine     REACTION: unspecified  . Theophylline     REACTION: unspecified    Current Outpatient Prescriptions on File Prior to Visit  Medication Sig Dispense Refill  . Ascorbic Acid (VITAMIN C) 500 MG tablet daily. Take 2 tablet daily     . aspirin 81 MG EC tablet Take 81 mg by mouth daily.      . Calcium Carbonate-Vitamin D (CALCIUM-VITAMIN D) 500-200 MG-UNIT per tablet Take 1 tablet by mouth 2 (two) times daily with a meal.      . cetirizine (ZYRTEC) 10 MG tablet Take 10 mg by mouth daily.    . Cranberry 1000 MG CAPS Take by mouth. Take 2 tablets daily     . diltiazem (TIAZAC) 360 MG 24 hr capsule TAKE 1 CAPSULE EVERY DAY 90 capsule 2  . fish oil-omega-3 fatty acids 1000 MG capsule Take 2 g by mouth 2 (two) times daily.      . fluticasone (FLONASE) 50 MCG/ACT nasal spray Place 2 sprays into both nostrils daily. 16 g 6  . furosemide (LASIX) 40 MG tablet TAKE  1 TABLET DAILY ONLY IF NEEDED FOR SWELLING 90 tablet 1  . glucosamine-chondroitin 500-400 MG tablet Take 2 tablets by mouth daily.     . meclizine (ANTIVERT) 25 MG tablet TAKE ONE TABLET BY MOUTH THREE TIMES DAILY AS NEEDED FOR DIZZINESS 60 tablet 0  . metoprolol succinate (TOPROL-XL) 50 MG 24 hr tablet TAKE 1 TABLET TWICE DAILY WITH OR IMMEDIATELY FOLLOWING MEALS 180 tablet 1  . Multiple Vitamin (MULTIVITAMIN) tablet Take 1 tablet by mouth daily.      . Probiotic Product ([place]) Take by mouth daily.    . quinapril (ACCUPRIL) 40 MG tablet TAKE 1 TABLET TWICE DAILY 180 tablet 1  . spironolactone (ALDACTONE) 25 MG tablet TAKE 1 TABLET EVERY DAY 90 tablet 1  . SYNTHROID 150 MCG tablet TAKE 1 TABLET EVERY DAY BEFORE BREAKFAST 90 tablet 3  . VENTOLIN HFA 108 (90 Base) MCG/ACT inhaler INHALE 2 PUFFS EVERY SIX HOURS AS NEEDED FOR WHEEZING OR SHORTNESS OF BREATH 54 g 3  . vitamin B-12 (CYANOCOBALAMIN) 500 MCG  tablet Take 500 mcg by mouth daily.      . Wheat Dextrin (BENEFIBER PO) Take by mouth 1 day or 1 dose.    . [DISCONTINUED] diltiazem (CARDIZEM CD) 360 MG 24 hr capsule Take 1 capsule (360 mg total) by mouth daily. 90 capsule 3   No current facility-administered medications on file prior to visit.     BP 126/80 (BP Location: Right Arm, Patient Position: Sitting, Cuff Size: Normal)   Pulse 60   Temp 98.7 F (37.1 C) (Oral)   Resp 20   Ht 5' 2.5" (1.588 m)   Wt 192 lb (87.1 kg)   BMI 34.56 kg/m   Medicare wellness exam  1. Risk factors, based on past  M,S,F history.  Current vascular risk factors include hypertension and impaired glucose tolerance  2.  Physical activities:remains fairly active limited somewhat due to recent right knee pain  3.  Depression/mood:no history of major depression or mood disorder  4.  Hearing:no deficits  5.  ADL's:independent in all aspects of daily living  6.  Fall risk:low  7.  Home safety:no problems identified  8.  Height weight, and visual acuity;height and weight stable no change in visual acuity has had a recent bone density study.  Does see ophthalmology annually  9.  Counseling:continue efforts at weight loss and more rigorous exercise regimen  10. Lab orders based on risk factors:laboratory profile will be reviewed including hepatitis C antibodyas well as hemoglobin A1c  11. Referral :follow-up ophthalmology and OB/GYN  12. Care plan:continue heart healthy diet  13. Cognitive assessment: alert in order with normal affect.  No cognitive dysfunction  14. Screening: Patient provided with a written and personalized 5-10 year screening schedule in the AVS.    15. Provider List Update: includes primary care orthopedics, ophthalmology GI and OB/GYN    Review of Systems  Constitutional: Negative for appetite change, fatigue, fever and unexpected weight change.  HENT: Negative for congestion, dental problem, ear pain, hearing loss, mouth  sores, nosebleeds, sinus pressure, sore throat, tinnitus, trouble swallowing and voice change.   Eyes: Negative for photophobia, pain, redness and visual disturbance.  Respiratory: Negative for cough, chest tightness and shortness of breath.   Cardiovascular: Negative for chest pain, palpitations and leg swelling.  Gastrointestinal: Negative for abdominal distention, abdominal pain, blood in stool, constipation, diarrhea, nausea, rectal pain and vomiting.  Genitourinary: Negative for difficulty urinating, dysuria, flank pain, frequency, genital sores, hematuria,  menstrual problem, pelvic pain, urgency, vaginal bleeding, vaginal discharge and vaginal pain.  Musculoskeletal: Negative for arthralgias, back pain and neck stiffness.       Right knee pain  Skin: Negative for rash.  Neurological: Negative for dizziness, syncope, speech difficulty, weakness, light-headedness, numbness and headaches.  Hematological: Negative for adenopathy. Does not bruise/bleed easily.  Psychiatric/Behavioral: Negative for agitation, behavioral problems, dysphoric mood, self-injury and suicidal ideas. The patient is not nervous/anxious.        Objective:   Physical Exam  Constitutional: She is oriented to person, place, and time. She appears well-developed and well-nourished.  Blood pressure well controlled  HENT:  Head: Normocephalic and atraumatic.  Right Ear: External ear normal.  Left Ear: External ear normal.  Mouth/Throat: Oropharynx is clear and moist.  Eyes: Conjunctivae and EOM are normal.  Neck: Normal range of motion. Neck supple. No JVD present. No thyromegaly present.  Cardiovascular: Normal rate, regular rhythm, normal heart sounds and intact distal pulses.   No murmur heard. Pulmonary/Chest: Effort normal and breath sounds normal. She has no wheezes. She has no rales.  Abdominal: Soft. Bowel sounds are normal. She exhibits no distension and no mass. There is no tenderness. There is no rebound and  no guarding.  Genitourinary: Vagina normal.  Musculoskeletal: Normal range of motion. She exhibits no edema or tenderness.  Neurological: She is alert and oriented to person, place, and time. She has normal reflexes. No cranial nerve deficit. She exhibits normal muscle tone. Coordination normal.  Skin: Skin is warm and dry. No rash noted.  Psychiatric: She has a normal mood and affect. Her behavior is normal.          Assessment & Plan:   Preventive health care Essential hypertension, well-controlled Mild obesity weight loss encouraged Impaired glucose tolerance.  We'll check a hemoglobin A1c  Hypothyroidism.  We'll review TSH  Follow-up 6 months  [id number],PETER Pilar Plate

## 2016-03-04 NOTE — Patient Instructions (Signed)
Limit your sodium (Salt) intake  Please check your blood pressure on a regular basis.  If it is consistently greater than 150/90, please make an office appointment.    It is important that you exercise regularly, at least 20 minutes 3 to 4 times per week.  If you develop chest pain or shortness of breath seek  medical attention.  Take a calcium supplement, plus 800-1200 units of vitamin D  Return in 6 months for follow-up   

## 2016-03-05 LAB — HEPATITIS C ANTIBODY: HCV AB: NEGATIVE

## 2016-04-01 DIAGNOSIS — D224 Melanocytic nevi of scalp and neck: Secondary | ICD-10-CM | POA: Diagnosis not present

## 2016-04-01 DIAGNOSIS — L821 Other seborrheic keratosis: Secondary | ICD-10-CM | POA: Diagnosis not present

## 2016-04-01 DIAGNOSIS — D2271 Melanocytic nevi of right lower limb, including hip: Secondary | ICD-10-CM | POA: Diagnosis not present

## 2016-04-01 DIAGNOSIS — D2262 Melanocytic nevi of left upper limb, including shoulder: Secondary | ICD-10-CM | POA: Diagnosis not present

## 2016-04-01 DIAGNOSIS — D2272 Melanocytic nevi of left lower limb, including hip: Secondary | ICD-10-CM | POA: Diagnosis not present

## 2016-04-01 DIAGNOSIS — Z85820 Personal history of malignant melanoma of skin: Secondary | ICD-10-CM | POA: Diagnosis not present

## 2016-04-01 DIAGNOSIS — D1801 Hemangioma of skin and subcutaneous tissue: Secondary | ICD-10-CM | POA: Diagnosis not present

## 2016-04-01 DIAGNOSIS — D225 Melanocytic nevi of trunk: Secondary | ICD-10-CM | POA: Diagnosis not present

## 2016-04-01 DIAGNOSIS — L814 Other melanin hyperpigmentation: Secondary | ICD-10-CM | POA: Diagnosis not present

## 2016-04-16 ENCOUNTER — Other Ambulatory Visit: Payer: Self-pay | Admitting: Internal Medicine

## 2016-06-17 ENCOUNTER — Other Ambulatory Visit: Payer: Self-pay | Admitting: Internal Medicine

## 2016-06-24 ENCOUNTER — Other Ambulatory Visit: Payer: Self-pay | Admitting: Internal Medicine

## 2016-06-24 DIAGNOSIS — Z1231 Encounter for screening mammogram for malignant neoplasm of breast: Secondary | ICD-10-CM

## 2016-06-25 ENCOUNTER — Other Ambulatory Visit: Payer: Self-pay | Admitting: Family Medicine

## 2016-07-16 ENCOUNTER — Ambulatory Visit
Admission: RE | Admit: 2016-07-16 | Discharge: 2016-07-16 | Disposition: A | Discharge status: Home / Self Care | Payer: Commercial Managed Care - HMO | Source: Ambulatory Visit | Attending: Internal Medicine | Admitting: Internal Medicine

## 2016-07-16 DIAGNOSIS — Z1231 Encounter for screening mammogram for malignant neoplasm of breast: Secondary | ICD-10-CM | POA: Diagnosis not present

## 2016-07-22 DIAGNOSIS — Z6834 Body mass index (BMI) 34.0-34.9, adult: Secondary | ICD-10-CM | POA: Diagnosis not present

## 2016-07-22 DIAGNOSIS — Z01419 Encounter for gynecological examination (general) (routine) without abnormal findings: Secondary | ICD-10-CM | POA: Diagnosis not present

## 2016-07-23 ENCOUNTER — Other Ambulatory Visit: Payer: Self-pay | Admitting: Internal Medicine

## 2016-07-29 ENCOUNTER — Other Ambulatory Visit: Payer: Self-pay | Admitting: Internal Medicine

## 2016-08-11 ENCOUNTER — Other Ambulatory Visit: Payer: Self-pay | Admitting: Internal Medicine

## 2016-09-01 ENCOUNTER — Encounter: Payer: Self-pay | Admitting: Internal Medicine

## 2016-09-01 ENCOUNTER — Ambulatory Visit (INDEPENDENT_AMBULATORY_CARE_PROVIDER_SITE_OTHER): Payer: Medicare HMO | Admitting: Internal Medicine

## 2016-09-01 VITALS — BP 142/82 | HR 62 | Ht 62.5 in | Wt 188.0 lb

## 2016-09-01 DIAGNOSIS — J4520 Mild intermittent asthma, uncomplicated: Secondary | ICD-10-CM

## 2016-09-01 DIAGNOSIS — I10 Essential (primary) hypertension: Secondary | ICD-10-CM

## 2016-09-01 DIAGNOSIS — R7302 Impaired glucose tolerance (oral): Secondary | ICD-10-CM

## 2016-09-01 DIAGNOSIS — E039 Hypothyroidism, unspecified: Secondary | ICD-10-CM | POA: Diagnosis not present

## 2016-09-01 NOTE — Patient Instructions (Signed)
Limit your sodium (Salt) intake    It is important that you exercise regularly, at least 20 minutes 3 to 4 times per week.  If you develop chest pain or shortness of breath seek  medical attention.  Return in 6 months for follow-up  

## 2016-09-01 NOTE — Progress Notes (Signed)
Subjective:    Patient ID: Jasmine Wheeler, female    DOB: 1946/03/07, 71 y.o.   MRN: [id number]  HPI 71 year old patient who is seen today for her six-month follow-up. She has essential hypertension which has been well-controlled. She has hypothyroidism.  TSH has been therapeutic over the past 2 years. She has impaired glucose tolerance and fasting blood sugar.  6 months ago was 111.  Hemoglobin A1c was in a nondiabetic range She has asthma, which has been mildly bothersome this spring.  She has required some rescue albuterol.  She remains on fluticasone and Zyrtec  No other concerns or complaints  Past Medical History:  Diagnosis Date  . Adenomatous colon polyp   . Asthma   . Chronic low back pain   . Diverticulosis 01/23/2010   left colon  . Heart murmur 2016  . Hypertension   . Hypothyroidism      Social History   Social History  . Marital status: Married    Spouse name: N/A  . Number of children: 3  . Years of education: N/A   Occupational History  . DEPT [employer]   Social History Main Topics  . Smoking status: Never Smoker  . Smokeless tobacco: Never Used  . Alcohol use No  . Drug use: No  . Sexual activity: Not on file   Other Topics Concern  . Not on file   Social History Narrative  . No narrative on file    Past Surgical History:  Procedure Laterality Date  . ABDOMINAL HYSTERECTOMY    . COLONOSCOPY W/ BIOPSIES  multiple  . HEMICOLECTOMY  02/15/10   right, tubulovillous adenoma appendix, Dr. Georgette Dover  . HEMORRHOID BANDING  2014  . LUMBAR EPIDURAL INJECTION  2011   x 2  . MELANOMA EXCISION Left 2012   left arm, not sure about lymph nodes  . REPEAT CESAREAN SECTION     3 in all, last 10    Family History  Problem Relation Age of Onset  . Lung cancer Father   . Heart disease Father   . Colon cancer Mother   . Colon cancer Maternal Uncle   . Colon cancer Maternal Uncle     Allergies  Allergen Reactions  . Nifedipine    REACTION: unspecified  . Theophylline     REACTION: unspecified    Current Outpatient Prescriptions on File Prior to Visit  Medication Sig Dispense Refill  . Ascorbic Acid (VITAMIN C) 500 MG tablet daily. Take 2 tablet daily     . aspirin 81 MG EC tablet Take 81 mg by mouth daily.      . Calcium Carbonate-Vitamin D (CALCIUM-VITAMIN D) 500-200 MG-UNIT per tablet Take 1 tablet by mouth 2 (two) times daily with a meal.      . cetirizine (ZYRTEC) 10 MG tablet Take 10 mg by mouth daily.    . Cranberry 1000 MG CAPS Take by mouth. Take 2 tablets daily     . diltiazem (TIAZAC) 360 MG 24 hr capsule TAKE 1 CAPSULE EVERY DAY 90 capsule 2  . fish oil-omega-3 fatty acids 1000 MG capsule Take 2 g by mouth 2 (two) times daily.      . fluticasone (FLONASE) 50 MCG/ACT nasal spray USE 2 SPRAYS IN EACH NOSTRIL EVERY DAY 48 g 6  . furosemide (LASIX) 40 MG tablet TAKE 1 TABLET DAILY ONLY IF NEEDED FOR SWELLING 90 tablet 1  . glucosamine-chondroitin 500-400 MG tablet Take 2 tablets by mouth daily.     Marland Kitchen  meclizine (ANTIVERT) 25 MG tablet TAKE ONE TABLET BY MOUTH THREE TIMES DAILY AS NEEDED FOR DIZZINESS 60 tablet 0  . metoprolol succinate (TOPROL-XL) 50 MG 24 hr tablet TAKE 1 TABLET TWICE DAILY WITH OR IMMEDIATELY FOLLOWING MEALS 180 tablet 0  . Multiple Vitamin (MULTIVITAMIN) tablet Take 1 tablet by mouth daily.      . Probiotic Product ([place]) Take by mouth daily.    . quinapril (ACCUPRIL) 40 MG tablet TAKE 1 TABLET TWICE DAILY 180 tablet 1  . spironolactone (ALDACTONE) 25 MG tablet TAKE 1 TABLET EVERY DAY 90 tablet 0  . SYNTHROID 150 MCG tablet TAKE 1 TABLET EVERY DAY BEFORE BREAKFAST 90 tablet 3  . VENTOLIN HFA 108 (90 Base) MCG/ACT inhaler INHALE 2 PUFFS EVERY SIX HOURS AS NEEDED FOR WHEEZING OR SHORTNESS OF BREATH 54 g 3  . vitamin B-12 (CYANOCOBALAMIN) 500 MCG tablet Take 500 mcg by mouth daily.      . Wheat Dextrin (BENEFIBER PO) Take by mouth 1 day or 1 dose.    . [DISCONTINUED]  diltiazem (CARDIZEM CD) 360 MG 24 hr capsule Take 1 capsule (360 mg total) by mouth daily. 90 capsule 3   No current facility-administered medications on file prior to visit.     BP (!) 142/82 (BP Location: Left Arm, Patient Position: Sitting, Cuff Size: Normal)   Pulse 62   Ht 5' 2.5" (1.588 m)   Wt 188 lb (85.3 kg)   SpO2 94%   BMI 33.84 kg/m      Review of Systems  Constitutional: Negative.   HENT: Positive for hearing loss, postnasal drip, rhinorrhea and sinus pressure. Negative for congestion, dental problem, sore throat and tinnitus.   Eyes: Negative for pain, discharge and visual disturbance.  Respiratory: Positive for wheezing. Negative for cough and shortness of breath.   Cardiovascular: Negative for chest pain, palpitations and leg swelling.  Gastrointestinal: Negative for abdominal distention, abdominal pain, blood in stool, constipation, diarrhea, nausea and vomiting.  Genitourinary: Negative for difficulty urinating, dysuria, flank pain, frequency, hematuria, pelvic pain, urgency, vaginal bleeding, vaginal discharge and vaginal pain.  Musculoskeletal: Negative for arthralgias, gait problem and joint swelling.  Skin: Negative for rash.  Neurological: Negative for dizziness, syncope, speech difficulty, weakness, numbness and headaches.  Hematological: Negative for adenopathy.  Psychiatric/Behavioral: Negative for agitation, behavioral problems and dysphoric mood. The patient is not nervous/anxious.        Objective:   Physical Exam  Constitutional: She is oriented to person, place, and time. She appears well-developed and well-nourished.  Blood pressure 136/80  HENT:  Head: Normocephalic.  Right Ear: External ear normal.  Left Ear: External ear normal.  Mouth/Throat: Oropharynx is clear and moist.  Some wax in right canal  Eyes: Conjunctivae and EOM are normal. Pupils are equal, round, and reactive to light.  Neck: Normal range of motion. Neck supple. No  thyromegaly present.  Cardiovascular: Normal rate, regular rhythm, normal heart sounds and intact distal pulses.   Grade 2-1/1 diastolic murmur of AI.  The left upper sternal border  (nl 2 D echo)  Pulmonary/Chest: Effort normal and breath sounds normal. No respiratory distress. She has no wheezes. She has no rales.  Abdominal: Soft. Bowel sounds are normal. She exhibits no mass. There is no tenderness.  Musculoskeletal: Normal range of motion.  Lymphadenopathy:    She has no cervical adenopathy.  Neurological: She is alert and oriented to person, place, and time.  Skin: Skin is warm and dry. No rash noted.  Psychiatric: She has a normal mood and affect. Her behavior is normal.          Assessment & Plan:   Essential hypertension, stable Mild asthma Impaired glucose tolerance.  Weight loss encouraged Hypothyroidism.  No change in regimen  CPX 6 months Medications updated  Nyoka Cowden

## 2016-09-02 ENCOUNTER — Other Ambulatory Visit: Payer: Self-pay | Admitting: Internal Medicine

## 2016-09-30 DIAGNOSIS — L821 Other seborrheic keratosis: Secondary | ICD-10-CM | POA: Diagnosis not present

## 2016-09-30 DIAGNOSIS — D2262 Melanocytic nevi of left upper limb, including shoulder: Secondary | ICD-10-CM | POA: Diagnosis not present

## 2016-09-30 DIAGNOSIS — D2272 Melanocytic nevi of left lower limb, including hip: Secondary | ICD-10-CM | POA: Diagnosis not present

## 2016-09-30 DIAGNOSIS — D225 Melanocytic nevi of trunk: Secondary | ICD-10-CM | POA: Diagnosis not present

## 2016-09-30 DIAGNOSIS — D692 Other nonthrombocytopenic purpura: Secondary | ICD-10-CM | POA: Diagnosis not present

## 2016-09-30 DIAGNOSIS — D2239 Melanocytic nevi of other parts of face: Secondary | ICD-10-CM | POA: Diagnosis not present

## 2016-09-30 DIAGNOSIS — D1801 Hemangioma of skin and subcutaneous tissue: Secondary | ICD-10-CM | POA: Diagnosis not present

## 2016-09-30 DIAGNOSIS — L2089 Other atopic dermatitis: Secondary | ICD-10-CM | POA: Diagnosis not present

## 2016-09-30 DIAGNOSIS — Z85820 Personal history of malignant melanoma of skin: Secondary | ICD-10-CM | POA: Diagnosis not present

## 2016-10-27 ENCOUNTER — Other Ambulatory Visit: Payer: Self-pay | Admitting: Internal Medicine

## 2017-01-08 ENCOUNTER — Encounter: Payer: Self-pay | Admitting: Internal Medicine

## 2017-01-14 ENCOUNTER — Other Ambulatory Visit: Payer: Self-pay | Admitting: Internal Medicine

## 2017-01-20 ENCOUNTER — Ambulatory Visit (INDEPENDENT_AMBULATORY_CARE_PROVIDER_SITE_OTHER): Payer: Medicare HMO

## 2017-01-20 DIAGNOSIS — Z23 Encounter for immunization: Secondary | ICD-10-CM | POA: Diagnosis not present

## 2017-03-09 ENCOUNTER — Encounter: Payer: Self-pay | Admitting: Internal Medicine

## 2017-03-09 ENCOUNTER — Ambulatory Visit (INDEPENDENT_AMBULATORY_CARE_PROVIDER_SITE_OTHER): Payer: Medicare HMO | Admitting: Internal Medicine

## 2017-03-09 VITALS — BP 130/74 | HR 59 | Temp 97.7°F | Ht 62.5 in | Wt 192.2 lb

## 2017-03-09 DIAGNOSIS — I10 Essential (primary) hypertension: Secondary | ICD-10-CM

## 2017-03-09 DIAGNOSIS — Z0000 Encounter for general adult medical examination without abnormal findings: Secondary | ICD-10-CM

## 2017-03-09 DIAGNOSIS — E039 Hypothyroidism, unspecified: Secondary | ICD-10-CM

## 2017-03-09 DIAGNOSIS — Z86010 Personal history of colonic polyps: Secondary | ICD-10-CM

## 2017-03-09 DIAGNOSIS — R7302 Impaired glucose tolerance (oral): Secondary | ICD-10-CM

## 2017-03-09 DIAGNOSIS — N951 Menopausal and female climacteric states: Secondary | ICD-10-CM | POA: Diagnosis not present

## 2017-03-09 LAB — COMPREHENSIVE METABOLIC PANEL
ALK PHOS: 78 U/L (ref 39–117)
ALT: 13 U/L (ref 0–35)
AST: 12 U/L (ref 0–37)
Albumin: 4.3 g/dL (ref 3.5–5.2)
BUN: 25 mg/dL — ABNORMAL HIGH (ref 6–23)
CALCIUM: 9.7 mg/dL (ref 8.4–10.5)
CHLORIDE: 100 meq/L (ref 96–112)
CO2: 31 mEq/L (ref 19–32)
CREATININE: 1.12 mg/dL (ref 0.40–1.20)
GFR: 50.57 mL/min — ABNORMAL LOW (ref >60.00)
Glucose, Bld: 111 mg/dL — ABNORMAL HIGH (ref 70–99)
Potassium: 4.2 mEq/L (ref 3.5–5.1)
SODIUM: 138 meq/L (ref 135–145)
Total Bilirubin: 0.5 mg/dL (ref 0.2–1.2)
Total Protein: 6.5 g/dL (ref 6.0–8.3)

## 2017-03-09 LAB — LIPID PANEL
CHOL/HDL RATIO: 5
Cholesterol: 230 mg/dL — ABNORMAL HIGH (ref 0–200)
HDL: 47 mg/dL (ref >39.00)
LDL CALC: 146 mg/dL — AB (ref 0–99)
NONHDL: 183.08
TRIGLYCERIDES: 187 mg/dL — AB (ref 0.0–149.0)
VLDL: 37.4 mg/dL (ref 0.0–40.0)

## 2017-03-09 LAB — CBC WITH DIFFERENTIAL/PLATELET
BASOS PCT: 0.3 % (ref 0.0–3.0)
Basophils Absolute: 0 10*3/uL (ref 0.0–0.1)
EOS PCT: 3.4 % (ref 0.0–5.0)
Eosinophils Absolute: 0.2 10*3/uL (ref 0.0–0.7)
HEMATOCRIT: 36.4 % (ref 36.0–46.0)
HEMOGLOBIN: 12 g/dL (ref 12.0–15.0)
LYMPHS PCT: 25.1 % (ref 12.0–46.0)
Lymphs Abs: 1.4 10*3/uL (ref 0.7–4.0)
MCHC: 33.1 g/dL (ref 30.0–36.0)
MCV: 91.4 fl (ref 78.0–100.0)
MONOS PCT: 7.7 % (ref 3.0–12.0)
Monocytes Absolute: 0.4 10*3/uL (ref 0.1–1.0)
NEUTROS ABS: 3.5 10*3/uL (ref 1.4–7.7)
Neutrophils Relative %: 63.5 % (ref 43.0–77.0)
PLATELETS: 261 10*3/uL (ref 150.0–400.0)
RBC: 3.98 Mil/uL (ref 3.87–5.11)
RDW: 14 % (ref 11.5–15.5)
WBC: 5.5 10*3/uL (ref 4.0–10.5)

## 2017-03-09 LAB — TSH: TSH: 3.87 u[IU]/mL (ref 0.35–4.50)

## 2017-03-09 NOTE — Patient Instructions (Signed)

## 2017-03-09 NOTE — Progress Notes (Signed)
Subjective:    Patient ID: Jasmine Wheeler, female    DOB: 25-Feb-1946, 71 y.o.   MRN: [id number]  HPI 71 year old patient who is seen today for an annual comprehensive exam as well as a subsequent Medicare wellness visit. She has a history of essential hypertension. She has had follow-up colonoscopy and does have a history of colonic polyps.  Mammogram was obtained earlier this year.  She continues annual gynecologic visits.  She has hypothyroidism and a history of impaired glucose tolerance.  Past Medical History:  Diagnosis Date  . Adenomatous colon polyp   . Asthma   . Chronic low back pain   . Diverticulosis 01/23/2010   left colon  . Heart murmur 2016  . Hypertension   . Hypothyroidism      Social History   Socioeconomic History  . Marital status: Married    Spouse name: Not on file  . Number of children: 3  . Years of education: Not on file  . Highest education level: Not on file  Social Needs  . Financial resource strain: Not on file  . Food insecurity - worry: Not on file  . Food insecurity - inability: Not on file  . Transportation needs - medical: Not on file  . Transportation needs - non-medical: Not on file  Occupational History  . 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MGR    Employer: [employer]  Tobacco Use  . Smoking status: Never Smoker  . Smokeless tobacco: Never Used  Substance and Sexual Activity  . Alcohol use: No  . Drug use: No  . Sexual activity: Not on file  Other Topics Concern  . Not on file  Social History Narrative  . Not on file    Past Surgical History:  Procedure Laterality Date  . ABDOMINAL HYSTERECTOMY    . COLONOSCOPY W/ BIOPSIES  multiple  . HEMICOLECTOMY  02/15/10   right, tubulovillous adenoma appendix, Dr. Georgette Dover  . HEMORRHOID BANDING  2014  . LUMBAR EPIDURAL INJECTION  2011   x 2  . MELANOMA EXCISION Left 2012   left arm, not sure about lymph nodes  . REPEAT CESAREAN SECTION     3 in all, last 10    Family History  Problem  Relation Age of Onset  . Lung cancer Father   . Heart disease Father   . Colon cancer Mother   . Colon cancer Maternal Uncle   . Colon cancer Maternal Uncle     Allergies  Allergen Reactions  . Nifedipine     REACTION: unspecified  . Theophylline     REACTION: unspecified    Current Outpatient Medications on File Prior to Visit  Medication Sig Dispense Refill  . Ascorbic Acid (VITAMIN C) 500 MG tablet daily. Take 2 tablet daily     . aspirin 81 MG EC tablet Take 81 mg by mouth daily.      . Calcium Carbonate-Vitamin D (CALCIUM-VITAMIN D) 500-200 MG-UNIT per tablet Take 1 tablet by mouth 2 (two) times daily with a meal.      . cetirizine (ZYRTEC) 10 MG tablet Take 10 mg by mouth daily.    . Cranberry 1000 MG CAPS Take by mouth. Take 2 tablets daily     . diltiazem (TIAZAC) 360 MG 24 hr capsule TAKE 1 CAPSULE EVERY DAY 90 capsule 2  . fish oil-omega-3 fatty acids 1000 MG capsule Take 2 g by mouth 2 (two) times daily.      . fluticasone (FLONASE) 50 MCG/ACT nasal spray USE  2 SPRAYS IN EACH NOSTRIL EVERY DAY 48 g 6  . furosemide (LASIX) 40 MG tablet TAKE 1 TABLET DAILY ONLY IF NEEDED FOR SWELLING 90 tablet 1  . glucosamine-chondroitin 500-400 MG tablet Take 2 tablets by mouth daily.     . meclizine (ANTIVERT) 25 MG tablet TAKE ONE TABLET BY MOUTH THREE TIMES DAILY AS NEEDED FOR DIZZINESS 60 tablet 0  . metoprolol succinate (TOPROL-XL) 50 MG 24 hr tablet TAKE 1 TABLET TWICE DAILY WITH OR IMMEDIATELY FOLLOWING MEALS 180 tablet 1  . Multiple Vitamin (MULTIVITAMIN) tablet Take 1 tablet by mouth daily.      . Probiotic Product ([place]) Take by mouth daily.    . quinapril (ACCUPRIL) 40 MG tablet TAKE 1 TABLET TWICE DAILY 180 tablet 1  . spironolactone (ALDACTONE) 25 MG tablet TAKE 1 TABLET EVERY DAY 90 tablet 1  . SYNTHROID 150 MCG tablet TAKE 1 TABLET EVERY DAY BEFORE BREAKFAST 90 tablet 3  . VENTOLIN HFA 108 (90 Base) MCG/ACT inhaler INHALE 2 PUFFS EVERY SIX HOURS AS  NEEDED FOR WHEEZING OR SHORTNESS OF BREATH 54 g 3  . vitamin B-12 (CYANOCOBALAMIN) 500 MCG tablet Take 500 mcg by mouth daily.      . Wheat Dextrin (BENEFIBER PO) Take by mouth 1 day or 1 dose.    . [DISCONTINUED] diltiazem (CARDIZEM CD) 360 MG 24 hr capsule Take 1 capsule (360 mg total) by mouth daily. 90 capsule 3   No current facility-administered medications on file prior to visit.     BP 130/74 (BP Location: Left Arm, Patient Position: Sitting, Cuff Size: Normal)   Pulse (!) 59   Temp 97.7 F (36.5 C) (Oral)   Ht 5' 2.5" (1.588 m)   Wt 192 lb 3.2 oz (87.2 kg)   SpO2 95%   BMI 34.59 kg/m    Review of Systems  Constitutional: Negative.   HENT: Negative for congestion, dental problem, hearing loss, rhinorrhea, sinus pressure, sore throat and tinnitus.   Eyes: Negative for pain, discharge and visual disturbance.  Respiratory: Negative for cough and shortness of breath.   Cardiovascular: Negative for chest pain, palpitations and leg swelling.  Gastrointestinal: Negative for abdominal distention, abdominal pain, blood in stool, constipation, diarrhea, nausea and vomiting.  Genitourinary: Negative for difficulty urinating, dysuria, flank pain, frequency, hematuria, pelvic pain, urgency, vaginal bleeding, vaginal discharge and vaginal pain.  Musculoskeletal: Negative for arthralgias, gait problem and joint swelling.  Skin: Negative for rash.  Neurological: Negative for dizziness, syncope, speech difficulty, weakness, numbness and headaches.  Hematological: Negative for adenopathy.  Psychiatric/Behavioral: Negative for agitation, behavioral problems and dysphoric mood. The patient is not nervous/anxious.    Subsequent Medicare wellness visit     1. Risk factors, based on past  M,S,F history.    Cardio vascular risk factors include hypertension and impaired glucose tolerance  2.  Physical activities:remains fairly active limited somewhat due to responsibilities taking care of  elderly mother  3.  Depression/mood:no history of major depression or mood disorder; primary caregiver for her elderly mother causing some situational stress.  Mother is being treated for bladder cancer  4.  Hearing:no deficits  5.  ADL's:independent in all aspects of daily living  6.  Fall risk:low  7.  Home safety:no problems identified  8.  Height weight, and visual acuity;height and weight stable no change in visual acuity has had a recent bone density study.  Does see ophthalmology annually  9.  Counseling:continue efforts at weight loss and more rigorous  exercise regimen  10. Lab orders based on risk factors:laboratory profile will be reviewed including hepatitis C antibody as well as hemoglobin A1c  11. Referral :follow-up ophthalmology and OB/GYN  12. Care plan:continue heart healthy diet  13. Cognitive assessment: alert in order with normal affect.  No cognitive dysfunction  14. Screening: Patient provided with a written and personalized 5-10 year screening schedule in the AVS.    15. Provider List Update: includes primary care orthopedics, ophthalmology GI and OB/GYN  Objective:   Physical Exam  Constitutional: She is oriented to person, place, and time. She appears well-developed and well-nourished. No distress.  Weight 192 Blood pressure 130/74  HENT:  Head: Normocephalic.  Right Ear: External ear normal.  Left Ear: External ear normal.  Mouth/Throat: Oropharynx is clear and moist.  Eyes: Conjunctivae and EOM are normal. Pupils are equal, round, and reactive to light.  Neck: Normal range of motion. Neck supple. No thyromegaly present.  Cardiovascular: Normal rate, regular rhythm, normal heart sounds and intact distal pulses.  Pulmonary/Chest: Effort normal and breath sounds normal.  Abdominal: Soft. Bowel sounds are normal. She exhibits no mass. There is no tenderness.  Musculoskeletal: Normal range of motion.  Lymphadenopathy:    She has no  cervical adenopathy.  Neurological: She is alert and oriented to person, place, and time.  Skin: Skin is warm and dry. No rash noted.  Psychiatric: She has a normal mood and affect. Her behavior is normal.          Assessment & Plan:  Preventive health examination  Subsequent Medicare wellness visit Essential hypertension well-controlled History of hypothyroidism.  Will review TSH. History of colonic polyps  impaired glucose tolerance.  Weight loss and more rigorous activity is encouraged  Nyoka Cowden

## 2017-04-24 ENCOUNTER — Encounter: Payer: Self-pay | Admitting: Adult Health

## 2017-04-24 ENCOUNTER — Ambulatory Visit (INDEPENDENT_AMBULATORY_CARE_PROVIDER_SITE_OTHER): Payer: Medicare HMO | Admitting: Adult Health

## 2017-04-24 VITALS — BP 150/80 | HR 62 | Temp 98.1°F | Wt 190.0 lb

## 2017-04-24 DIAGNOSIS — J4520 Mild intermittent asthma, uncomplicated: Secondary | ICD-10-CM

## 2017-04-24 MED ORDER — AZITHROMYCIN 250 MG PO TABS
ORAL_TABLET | ORAL | 0 refills | Status: DC
Start: 2017-04-24 — End: 2017-09-18

## 2017-04-24 MED ORDER — IPRATROPIUM-ALBUTEROL 0.5-2.5 (3) MG/3ML IN SOLN
3.0000 mL | Freq: Once | RESPIRATORY_TRACT | Status: AC
  Administered 2017-04-24: 3 mL via RESPIRATORY_TRACT

## 2017-04-24 MED ORDER — PREDNISONE 10 MG PO TABS: ORAL_TABLET | ORAL | 0 refills | Status: DC

## 2017-04-24 NOTE — Progress Notes (Signed)
Subjective:    Patient ID: Jasmine Wheeler, female    DOB: [birth year], 72 y.o.   MRN: [id number]  Sinusitis  This is a new problem. There has been no fever. Associated symptoms include congestion, coughing (dry ), diaphoresis and shortness of breath. Pertinent negatives include no ear pain, headaches, sinus pressure or sore throat. Past treatments include oral decongestants (Mucinex and Inhalers ). The treatment provided mild relief.      Review of Systems  Constitutional: Positive for diaphoresis. Negative for fatigue and fever.  HENT: Positive for congestion, postnasal drip and rhinorrhea. Negative for ear pain, sinus pressure, sinus pain and sore throat.   Respiratory: Positive for cough (dry ), shortness of breath and wheezing.   Cardiovascular: Negative.   Gastrointestinal: Negative.   Neurological: Negative for headaches.   Past Medical History:  Diagnosis Date  . Adenomatous colon polyp   . Asthma   . Chronic low back pain   . Diverticulosis 01/23/2010   left colon  . Heart murmur 2016  . Hypertension   . Hypothyroidism     Social History   Socioeconomic History  . Marital status: Married    Spouse name: Not on file  . Number of children: 3  . Years of education: Not on file  . Highest education level: Not on file  Social Needs  . Financial resource strain: Not on file  . Food insecurity - worry: Not on file  . Food insecurity - inability: Not on file  . Transportation needs - medical: Not on file  . Transportation needs - non-medical: Not on file  Occupational History  . 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[employer]    Employer: [employer]  Tobacco Use  . Smoking status: Never Smoker  . Smokeless tobacco: Never Used  Substance and Sexual Activity  . Alcohol use: No  . Drug use: No  . Sexual activity: Not on file  Other Topics Concern  . Not on file  Social History Narrative  . Not on file    Past Surgical History:  Procedure Laterality Date  . ABDOMINAL  HYSTERECTOMY    . COLONOSCOPY W/ BIOPSIES  multiple  . HEMICOLECTOMY  02/15/10   right, tubulovillous adenoma appendix, Dr. Georgette Dover  . HEMORRHOID BANDING  2014  . LUMBAR EPIDURAL INJECTION  2011   x 2  . MELANOMA EXCISION Left 2012   left arm, not sure about lymph nodes  . REPEAT CESAREAN SECTION     3 in all, last 7    Family History  Problem Relation Age of Onset  . Lung cancer Father   . Heart disease Father   . Colon cancer Mother   . Colon cancer Maternal Uncle   . Colon cancer Maternal Uncle     Allergies  Allergen Reactions  . Nifedipine     REACTION: unspecified  . Theophylline     REACTION: unspecified    Current Outpatient Medications on File Prior to Visit  Medication Sig Dispense Refill  . Ascorbic Acid (VITAMIN C) 500 MG tablet daily. Take 2 tablet daily     . aspirin 81 MG EC tablet Take 81 mg by mouth daily.      . Calcium Carbonate-Vitamin D (CALCIUM-VITAMIN D) 500-200 MG-UNIT per tablet Take 1 tablet by mouth 2 (two) times daily with a meal.      . cetirizine (ZYRTEC) 10 MG tablet Take 10 mg by mouth daily.    . Cranberry 1000 MG CAPS Take by mouth. Take 2 tablets daily     .  diltiazem (TIAZAC) 360 MG 24 hr capsule TAKE 1 CAPSULE EVERY DAY 90 capsule 2  . fish oil-omega-3 fatty acids 1000 MG capsule Take 2 g by mouth 2 (two) times daily.      . fluticasone (FLONASE) 50 MCG/ACT nasal spray USE 2 SPRAYS IN EACH NOSTRIL EVERY DAY 48 g 6  . furosemide (LASIX) 40 MG tablet TAKE 1 TABLET DAILY ONLY IF NEEDED FOR SWELLING 90 tablet 1  . glucosamine-chondroitin 500-400 MG tablet Take 2 tablets by mouth daily.     . meclizine (ANTIVERT) 25 MG tablet TAKE ONE TABLET BY MOUTH THREE TIMES DAILY AS NEEDED FOR DIZZINESS 60 tablet 0  . metoprolol succinate (TOPROL-XL) 50 MG 24 hr tablet TAKE 1 TABLET TWICE DAILY WITH OR IMMEDIATELY FOLLOWING MEALS 180 tablet 1  . Multiple Vitamin (MULTIVITAMIN) tablet Take 1 tablet by mouth daily.      . Probiotic Product ([place]) Take by mouth daily.    . quinapril (ACCUPRIL) 40 MG tablet TAKE 1 TABLET TWICE DAILY 180 tablet 1  . spironolactone (ALDACTONE) 25 MG tablet TAKE 1 TABLET EVERY DAY 90 tablet 1  . SYNTHROID 150 MCG tablet TAKE 1 TABLET EVERY DAY BEFORE BREAKFAST 90 tablet 3  . VENTOLIN HFA 108 (90 Base) MCG/ACT inhaler INHALE 2 PUFFS EVERY SIX HOURS AS NEEDED FOR WHEEZING OR SHORTNESS OF BREATH 54 g 3  . vitamin B-12 (CYANOCOBALAMIN) 500 MCG tablet Take 500 mcg by mouth daily.      . Wheat Dextrin (BENEFIBER PO) Take by mouth 1 day or 1 dose.    . [DISCONTINUED] diltiazem (CARDIZEM CD) 360 MG 24 hr capsule Take 1 capsule (360 mg total) by mouth daily. 90 capsule 3   No current facility-administered medications on file prior to visit.     BP (!) 150/80   Pulse 62   Temp 98.1 F (36.7 C) (Oral)   Wt 190 lb (86.2 kg)   SpO2 97%   BMI 34.20 kg/m       Objective:   Physical Exam  Constitutional: She appears well-developed and well-nourished. No distress.  HENT:  Right Ear: Hearing, tympanic membrane and external ear normal.  Left Ear: Hearing, tympanic membrane, external ear and ear canal normal.  Nose: Mucosal edema and rhinorrhea present. Right sinus exhibits no maxillary sinus tenderness and no frontal sinus tenderness. Left sinus exhibits no maxillary sinus tenderness and no frontal sinus tenderness.  Mouth/Throat: Uvula is midline, oropharynx is clear and moist and mucous membranes are normal.  Cardiovascular: Normal rate, regular rhythm, normal heart sounds and intact distal pulses. Exam reveals no gallop.  No murmur heard. Pulmonary/Chest: Effort normal. No respiratory distress. She has wheezes. She has rhonchi. She has no rales. She exhibits no tenderness.  Abdominal: Soft. Bowel sounds are normal. She exhibits no distension and no mass. There is no tenderness. There is no rebound and no guarding.  Skin: Skin is warm and dry. She is not diaphoretic.  Psychiatric: She has a  normal mood and affect. Her behavior is normal. Judgment and thought content normal.  Nursing note and vitals reviewed.     Assessment & Plan:  1. Mild intermittent asthma without complication - ipratropium-albuterol (DUONEB) 0.5-2.5 (3) MG/3ML nebulizer solution 3 mL - azithromycin (ZITHROMAX Z-PAK) 250 MG tablet; Take 2 tablets on Day 1.  Then take 1 tablet daily.  Dispense: 6 tablet; Refill: 0 - predniSONE (DELTASONE) 10 MG tablet; 40 mg x 3 days, 20 mg x 3 days, 10 mg  x 3 days  Dispense: 21 tablet; Refill: 0  - Patient endorsed feeling as though she could breath easier after duoneb. Rhonchi had resolved but continued to have trace wheezing throughout lung fields.   Follow up if no improvement in the next 2-3 days   Dorothyann Peng, NP

## 2017-06-09 ENCOUNTER — Other Ambulatory Visit: Payer: Self-pay | Admitting: Internal Medicine

## 2017-06-12 ENCOUNTER — Other Ambulatory Visit: Payer: Self-pay | Admitting: Obstetrics & Gynecology

## 2017-06-12 DIAGNOSIS — Z1231 Encounter for screening mammogram for malignant neoplasm of breast: Secondary | ICD-10-CM

## 2017-06-29 ENCOUNTER — Other Ambulatory Visit: Payer: Self-pay | Admitting: Obstetrics & Gynecology

## 2017-06-29 DIAGNOSIS — E2839 Other primary ovarian failure: Secondary | ICD-10-CM

## 2017-07-13 ENCOUNTER — Telehealth: Payer: Self-pay | Admitting: Internal Medicine

## 2017-07-13 NOTE — Telephone Encounter (Signed)
Copied from [place] [id number]. Topic: Quick Communication - Rx Refill/Question >> Jul 13, 2017 12:39 PM Wynetta Emery, [place] C wrote: Medication: VENTOLIN HFA 108 (90 Base) MCG/ACT inhaler --- 90 day supply    Has the patient contacted their pharmacy? Yes    (Agent: If no, request that the patient contact the pharmacy for the refill.)  Preferred Pharmacy (with phone number or street name): [place], [place]: Please be advised that RX refills may take up to 3 business days. We ask that you follow-up with your pharmacy.

## 2017-07-14 ENCOUNTER — Other Ambulatory Visit: Payer: Self-pay | Admitting: *Deleted

## 2017-07-14 MED ORDER — ALBUTEROL SULFATE HFA 108 (90 BASE) MCG/ACT IN AERS: INHALATION_SPRAY | RESPIRATORY_TRACT | 3 refills | Status: DC

## 2017-07-14 NOTE — Telephone Encounter (Signed)
Rx refilled per protocol- LOV: 04/24/17

## 2017-07-21 ENCOUNTER — Ambulatory Visit: Payer: Medicare HMO

## 2017-07-21 ENCOUNTER — Ambulatory Visit
Admission: RE | Admit: 2017-07-21 | Discharge: 2017-07-21 | Disposition: A | Discharge status: Home / Self Care | Payer: Medicare HMO | Source: Ambulatory Visit | Attending: Obstetrics & Gynecology | Admitting: Obstetrics & Gynecology

## 2017-07-21 DIAGNOSIS — Z1231 Encounter for screening mammogram for malignant neoplasm of breast: Secondary | ICD-10-CM

## 2017-07-21 DIAGNOSIS — E2839 Other primary ovarian failure: Secondary | ICD-10-CM

## 2017-07-21 DIAGNOSIS — Z780 Asymptomatic menopausal state: Secondary | ICD-10-CM | POA: Diagnosis not present

## 2017-07-21 DIAGNOSIS — M85852 Other specified disorders of bone density and structure, left thigh: Secondary | ICD-10-CM | POA: Diagnosis not present

## 2017-07-21 LAB — HM MAMMOGRAPHY: HM Mammogram: NORMAL (ref 0–4)

## 2017-07-24 ENCOUNTER — Encounter: Payer: Self-pay | Admitting: Internal Medicine

## 2017-07-27 DIAGNOSIS — Z6834 Body mass index (BMI) 34.0-34.9, adult: Secondary | ICD-10-CM | POA: Diagnosis not present

## 2017-07-27 DIAGNOSIS — Z01419 Encounter for gynecological examination (general) (routine) without abnormal findings: Secondary | ICD-10-CM | POA: Diagnosis not present

## 2017-07-30 ENCOUNTER — Other Ambulatory Visit: Payer: Self-pay | Admitting: Internal Medicine

## 2017-08-03 ENCOUNTER — Other Ambulatory Visit: Payer: Self-pay | Admitting: Internal Medicine

## 2017-08-04 ENCOUNTER — Other Ambulatory Visit: Payer: Self-pay | Admitting: Internal Medicine

## 2017-09-01 DIAGNOSIS — H43813 Vitreous degeneration, bilateral: Secondary | ICD-10-CM | POA: Diagnosis not present

## 2017-09-01 DIAGNOSIS — H4312 Vitreous hemorrhage, left eye: Secondary | ICD-10-CM | POA: Diagnosis not present

## 2017-09-07 ENCOUNTER — Encounter: Payer: Self-pay | Admitting: Internal Medicine

## 2017-09-07 ENCOUNTER — Ambulatory Visit (INDEPENDENT_AMBULATORY_CARE_PROVIDER_SITE_OTHER): Payer: Medicare HMO | Admitting: Internal Medicine

## 2017-09-07 VITALS — BP 130/82 | HR 59 | Temp 98.0°F | Wt 188.0 lb

## 2017-09-07 DIAGNOSIS — I10 Essential (primary) hypertension: Secondary | ICD-10-CM

## 2017-09-07 DIAGNOSIS — R7302 Impaired glucose tolerance (oral): Secondary | ICD-10-CM

## 2017-09-07 DIAGNOSIS — E039 Hypothyroidism, unspecified: Secondary | ICD-10-CM

## 2017-09-07 DIAGNOSIS — J4520 Mild intermittent asthma, uncomplicated: Secondary | ICD-10-CM | POA: Diagnosis not present

## 2017-09-07 LAB — POCT GLYCOSYLATED HEMOGLOBIN (HGB A1C): Hemoglobin A1C: 6

## 2017-09-07 NOTE — Patient Instructions (Signed)
Limit your sodium (Salt) intake  Please check your blood pressure on a regular basis.  If it is consistently greater than 150/90, please make an office appointment.     It is important that you exercise regularly, at least 20 minutes 3 to 4 times per week.  If you develop chest pain or shortness of breath seek  medical attention. 

## 2017-09-07 NOTE — Progress Notes (Signed)
Subjective:    Patient ID: Jasmine Wheeler, female    DOB: June 14, 1945, 72 y.o.   MRN: [id number]  HPI  72 year old patient who is seen today for her biannual follow-up.  She has essential hypertension which has been well controlled. She has been seen by ophthalmology recently due to some visual disturbances involving the left eye.  She has a history of low back pain and prior history of HNP.  She states that she has had a flare of some low back pain and has scheduled herself to see Dr. Nelva Bush. She also has had some lower abdominal crampy pain and is scheduled to see GI in the future.  She is seen annually by gynecology. She has a history of impaired glucose tolerance.  Past Medical History:  Diagnosis Date  . Adenomatous colon polyp   . Asthma   . Chronic low back pain   . Diverticulosis 01/23/2010   left colon  . Heart murmur 2016  . Hypertension   . Hypothyroidism      Social History   Socioeconomic History  . Marital status: Married    Spouse name: Not on file  . Number of children: 3  . Years of education: Not on file  . Highest education level: Not on file  Occupational History  . 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 Financial resource strain: Not on file  . Food insecurity:    Worry: Not on file    Inability: Not on file  . Transportation needs:    Medical: Not on file    Non-medical: Not on file  Tobacco Use  . Smoking status: Never Smoker  . Smokeless tobacco: Never Used  Substance and Sexual Activity  . Alcohol use: No  . Drug use: No  . Sexual activity: Not on file  Lifestyle  . Physical activity:    Days per week: Not on file    Minutes per session: Not on file  . Stress: Not on file  Relationships  . Social connections:    Talks on phone: Not on file    Gets together: Not on file    Attends religious service: Not on file    Active member of club or organization: Not on file    Attends meetings of clubs or  organizations: Not on file    Relationship status: Not on file  . Intimate partner violence:    Fear of current or ex partner: Not on file    Emotionally abused: Not on file    Physically abused: Not on file    Forced sexual activity: Not on file  Other Topics Concern  . Not on file  Social History Narrative  . Not on file    Past Surgical History:  Procedure Laterality Date  . ABDOMINAL HYSTERECTOMY    . COLONOSCOPY W/ BIOPSIES  multiple  . HEMICOLECTOMY  02/15/10   right, tubulovillous adenoma appendix, Dr. Georgette Dover  . HEMORRHOID BANDING  2014  . LUMBAR EPIDURAL INJECTION  2011   x 2  . MELANOMA EXCISION Left 2012   left arm, not sure about lymph nodes  . REPEAT CESAREAN SECTION     3 in all, last 30    Family History  Problem Relation Age of Onset  . Lung cancer Father   . Heart disease Father   . Colon cancer Mother   . Colon cancer Maternal Uncle   . Colon cancer Maternal Uncle  Allergies  Allergen Reactions  . Nifedipine     REACTION: unspecified  . Theophylline     REACTION: unspecified    Current Outpatient Medications on File Prior to Visit  Medication Sig Dispense Refill  . albuterol (VENTOLIN HFA) 108 (90 Base) MCG/ACT inhaler INHALE 2 PUFFS EVERY SIX HOURS AS NEEDED FOR WHEEZING OR SHORTNESS OF BREATH 54 g 3  . Ascorbic Acid (VITAMIN C) 500 MG tablet daily. Take 2 tablet daily     . aspirin 81 MG EC tablet Take 81 mg by mouth daily.      Marland Kitchen azithromycin (ZITHROMAX Z-PAK) 250 MG tablet Take 2 tablets on Day 1.  Then take 1 tablet daily. 6 tablet 0  . Calcium Carbonate-Vitamin D (CALCIUM-VITAMIN D) 500-200 MG-UNIT per tablet Take 1 tablet by mouth 2 (two) times daily with a meal.      . cetirizine (ZYRTEC) 10 MG tablet Take 10 mg by mouth daily.    . Cranberry 1000 MG CAPS Take by mouth. Take 2 tablets daily     . diltiazem (TIAZAC) 360 MG 24 hr capsule TAKE 1 CAPSULE EVERY DAY 90 capsule 2  . fish oil-omega-3 fatty acids 1000 MG capsule Take 2 g by  mouth 2 (two) times daily.      . fluticasone (FLONASE) 50 MCG/ACT nasal spray USE 2 SPRAYS IN EACH NOSTRIL EVERY DAY 48 g 6  . furosemide (LASIX) 40 MG tablet TAKE 1 TABLET DAILY ONLY IF NEEDED FOR SWELLING 90 tablet 1  . glucosamine-chondroitin 500-400 MG tablet Take 2 tablets by mouth daily.     . meclizine (ANTIVERT) 25 MG tablet TAKE ONE TABLET BY MOUTH THREE TIMES DAILY AS NEEDED FOR DIZZINESS 60 tablet 0  . metoprolol succinate (TOPROL-XL) 50 MG 24 hr tablet TAKE 1 TABLET TWICE DAILY WITH OR IMMEDIATELY FOLLOWING MEALS 180 tablet 1  . Multiple Vitamin (MULTIVITAMIN) tablet Take 1 tablet by mouth daily.      . Probiotic Product ([place]) Take by mouth daily.    . quinapril (ACCUPRIL) 40 MG tablet TAKE 1 TABLET TWICE DAILY 180 tablet 1  . spironolactone (ALDACTONE) 25 MG tablet TAKE 1 TABLET EVERY DAY 90 tablet 1  . SYNTHROID 150 MCG tablet TAKE 1 TABLET EVERY DAY BEFORE BREAKFAST 90 tablet 3  . vitamin B-12 (CYANOCOBALAMIN) 500 MCG tablet Take 500 mcg by mouth daily.      . Wheat Dextrin (BENEFIBER PO) Take by mouth 1 day or 1 dose.    . [DISCONTINUED] diltiazem (CARDIZEM CD) 360 MG 24 hr capsule Take 1 capsule (360 mg total) by mouth daily. 90 capsule 3   No current facility-administered medications on file prior to visit.     BP 130/82 (BP Location: Right Arm, Patient Position: Sitting, Cuff Size: Large)   Pulse (!) 59   Temp 98 F (36.7 C) (Oral)   Wt 188 lb (85.3 kg)   SpO2 98%   BMI 33.84 kg/m     Review of Systems  Constitutional: Negative.   HENT: Negative for congestion, dental problem, hearing loss, rhinorrhea, sinus pressure, sore throat and tinnitus.   Eyes: Positive for visual disturbance. Negative for pain and discharge.  Respiratory: Negative for cough and shortness of breath.   Cardiovascular: Negative for chest pain, palpitations and leg swelling.  Gastrointestinal: Positive for abdominal pain. Negative for abdominal distention, blood in  stool, constipation, diarrhea, nausea and vomiting.  Genitourinary: Negative for difficulty urinating, dysuria, flank pain, frequency, hematuria, pelvic pain, urgency, vaginal  bleeding, vaginal discharge and vaginal pain.  Musculoskeletal: Positive for back pain. Negative for arthralgias, gait problem and joint swelling.  Skin: Negative for rash.  Neurological: Negative for dizziness, syncope, speech difficulty, weakness, numbness and headaches.  Hematological: Negative for adenopathy.  Psychiatric/Behavioral: Negative for agitation, behavioral problems and dysphoric mood. The patient is not nervous/anxious.        Objective:   Physical Exam  Constitutional: She is oriented to person, place, and time. She appears well-developed and well-nourished.  Blood pressure 136/70  HENT:  Head: Normocephalic.  Right Ear: External ear normal.  Left Ear: External ear normal.  Mouth/Throat: Oropharynx is clear and moist.  Eyes: Pupils are equal, round, and reactive to light. Conjunctivae and EOM are normal.  Neck: Normal range of motion. Neck supple. No thyromegaly present.  Cardiovascular: Normal rate, regular rhythm, normal heart sounds and intact distal pulses.  Pulmonary/Chest: Effort normal and breath sounds normal.  Abdominal: Soft. Bowel sounds are normal. She exhibits no mass. There is no tenderness.  Musculoskeletal: Normal range of motion.  Lymphadenopathy:    She has no cervical adenopathy.  Neurological: She is alert and oriented to person, place, and time.  Skin: Skin is warm and dry. No rash noted.  Psychiatric: She has a normal mood and affect. Her behavior is normal.          Assessment & Plan:  Essential hypertension.  Well-controlled  Impaired glucose tolerance.  Will review hemoglobin A1c Low back pain Abdominal pain/history of diverticulosis.  Patient has been self referred to GI  Annual exam 6 months Medications updated  Nyoka Cowden

## 2017-09-14 ENCOUNTER — Other Ambulatory Visit: Payer: Self-pay | Admitting: Internal Medicine

## 2017-09-18 ENCOUNTER — Encounter: Payer: Self-pay | Admitting: Internal Medicine

## 2017-09-18 ENCOUNTER — Ambulatory Visit: Payer: Medicare HMO | Admitting: Internal Medicine

## 2017-09-18 VITALS — BP 150/80 | HR 68 | Ht 62.25 in | Wt 189.0 lb

## 2017-09-18 DIAGNOSIS — R1310 Dysphagia, unspecified: Secondary | ICD-10-CM

## 2017-09-18 DIAGNOSIS — K588 Other irritable bowel syndrome: Secondary | ICD-10-CM | POA: Insufficient documentation

## 2017-09-18 DIAGNOSIS — R1319 Other dysphagia: Secondary | ICD-10-CM

## 2017-09-18 DIAGNOSIS — K30 Functional dyspepsia: Secondary | ICD-10-CM | POA: Diagnosis not present

## 2017-09-18 MED ORDER — DICYCLOMINE HCL 10 MG PO CAPS: 10.0000 mg | ORAL_CAPSULE | Freq: Four times a day (QID) | ORAL | 0 refills | Status: DC | PRN

## 2017-09-18 NOTE — Progress Notes (Signed)
Jasmine Wheeler 72 y.o. [birth year] [id number]  Assessment & Plan:   Encounter Diagnoses  Name Primary?  . Esophageal dysphagia Yes  . Indigestion   . Irritable bowel syndrome      Schedule upper GI endoscopy with possible esophageal dilation The risks and benefits as well as alternatives of endoscopic procedure(s) have been discussed and reviewed. All questions answered. The patient agrees to proceed.  Gas and flatulence reduction diet information though she is well on the way with this  Dicyclomine 10 mg every 6 hours as needed may take 20 mg if needed for lower abdominal cramps side effects reviewed with the patient she does not have a history of glaucoma.  She will consider discontinuing glucosamine and chondroitin as that does not seem to be helping, and she will discontinue probiotic that has not made a difference  I appreciate the opportunity to care for this patient. CC: Marletta Lor, MD  Subjective:   Chief Complaint: Nausea, abdominal cramps HPI The patient is here with 2 main complaints, she is having nausea and indigestion particularly when she bends over, and upon further questioning there is some intermittent solid food dysphagia.  She also has bilateral lower abdominal cramping at times.  She has a background history of hemorrhoids which have been that banded multiple times and are better, history of intermittent abdominal pain and loose stools at times.  Last colonoscopy 2016 with hemorrhoids and diverticulosis.  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  No polyps on the last colonoscopy.  An elderly mother had colon cancer.  She is not really on any acid suppression medicine and the upper GI symptoms are not daily.  Solid foods will hang and she will have to drink some water intermittently and some pills that are large we will do that.  She has reduced beings, onions and other gas forming foods and feels  better.  She also stopped fiber supplementation because that was causing more cramps.  Caffeine is 2 cups of coffee a day rare tea rare soda is usually decaffeinated weight is stable.  Appetite is okay.  Wt Readings from Last 3 Encounters:  09/18/17 189 lb (85.7 kg)  09/07/17 188 lb (85.3 kg)  04/24/17 190 lb (86.2 kg)    Allergies  Allergen Reactions  . Nifedipine     REACTION: unspecified  . Theophylline     REACTION: unspecified   Current Meds  Medication Sig  . albuterol (VENTOLIN HFA) 108 (90 Base) MCG/ACT inhaler INHALE 2 PUFFS EVERY SIX HOURS AS NEEDED FOR WHEEZING OR SHORTNESS OF BREATH  . Ascorbic Acid (VITAMIN C) 500 MG tablet daily. Take 2 tablet daily   . aspirin 81 MG EC tablet Take 81 mg by mouth daily.    . Calcium Carbonate-Vitamin D (CALCIUM-VITAMIN D) 500-200 MG-UNIT per tablet Take 1 tablet by mouth 2 (two) times daily with a meal.    . cetirizine (ZYRTEC) 10 MG tablet Take 10 mg by mouth daily.  . Cranberry 1000 MG CAPS Take by mouth. Take 2 tablets daily   . diltiazem (TIAZAC) 360 MG 24 hr capsule TAKE 1 CAPSULE EVERY DAY  . fish oil-omega-3 fatty acids 1000 MG capsule Take 2 g by mouth 2 (two) times daily.    . fluticasone (FLONASE) 50 MCG/ACT nasal spray USE 2 SPRAYS IN EACH NOSTRIL EVERY DAY  . furosemide (LASIX) 40 MG tablet TAKE 1 TABLET DAILY ONLY IF NEEDED FOR SWELLING  .  glucosamine-chondroitin 500-400 MG tablet Take 2 tablets by mouth daily.   . meclizine (ANTIVERT) 25 MG tablet TAKE ONE TABLET BY MOUTH THREE TIMES DAILY AS NEEDED FOR DIZZINESS  . metoprolol succinate (TOPROL-XL) 50 MG 24 hr tablet TAKE 1 TABLET TWICE DAILY WITH OR IMMEDIATELY FOLLOWING MEALS  . Multiple Vitamin (MULTIVITAMIN) tablet Take 1 tablet by mouth daily.    . quinapril (ACCUPRIL) 40 MG tablet TAKE 1 TABLET TWICE DAILY  . spironolactone (ALDACTONE) 25 MG tablet TAKE 1 TABLET EVERY DAY  . SYNTHROID 150 MCG tablet TAKE 1 TABLET EVERY DAY BEFORE BREAKFAST  . vitamin B-12  (CYANOCOBALAMIN) 500 MCG tablet Take 500 mcg by mouth daily.    . [DISCONTINUED] Probiotic Product ([place]) Take by mouth daily.   Past Medical History:  Diagnosis Date  . Adenomatous colon polyp   . Asthma   . Chronic low back pain   . Diverticulosis 01/23/2010   left colon  . Heart murmur 2016  . Hypertension   . Hypothyroidism    Past Surgical History:  Procedure Laterality Date  . ABDOMINAL HYSTERECTOMY    . COLONOSCOPY W/ BIOPSIES  multiple  . HEMICOLECTOMY  02/15/10   right, tubulovillous adenoma appendix, Dr. Georgette Dover  . HEMORRHOID BANDING  2014  . LUMBAR EPIDURAL INJECTION  2011   x 2  . MELANOMA EXCISION Left 2012   left arm, not sure about lymph nodes  . REPEAT CESAREAN SECTION     3 in all, last 66   Social History   Social History Narrative   Married retired lives in [place]   No alcohol tobacco or drug use   Caffeine is 2 cups of coffee daily   3 children   Elderly mother lives with her and requires some care    family history includes Colon cancer in her maternal uncle, maternal uncle, and mother; Heart disease in her father; Lung cancer in her father.   Review of Systems Stress associated with caring for her elderly mother  Objective:   Physical Exam BP (!) 150/80 (BP Location: Left Arm, Patient Position: Sitting, Cuff Size: Normal)   Pulse 68   Ht 5' 2.25" (1.581 m)   Wt 189 lb (85.7 kg)   BMI 34.29 kg/m  Pleasant elderly obese white woman no acute distress Eyes anicteric Lungs clear back is kyphotic mildly Abdomen soft nontender no organomegaly or mass no obvious hernia a Heart normal S1-S2 no rubs murmurs or gallops Mood and affect is appropriate and she is alert and oriented x3 Data reviewed as per HPI

## 2017-09-18 NOTE — Patient Instructions (Signed)
You have been scheduled for an endoscopy. Please follow written instructions given to you at your visit today. If you use inhalers (even only as needed), please bring them with you on the day of your procedure. Your physician has requested that you go to www.startemmi.com and enter the access code given to you at your visit today. This web site gives a general overview about your procedure. However, you should still follow specific instructions given to you by our office regarding your preparation for the procedure.   Quit your probiotic per Dr Carlean Purl.   We are giving you a gas prevention diet to read and follow.   We have sent the following medications to your pharmacy for you to pick up at your convenience: Dicyclomine   I appreciate the opportunity to care for you. Silvano Rusk, MD, [facility]

## 2017-09-19 DIAGNOSIS — K317 Polyp of stomach and duodenum: Secondary | ICD-10-CM

## 2017-09-19 HISTORY — DX: Polyp of stomach and duodenum: K31.7

## 2017-09-21 ENCOUNTER — Other Ambulatory Visit: Payer: Self-pay

## 2017-09-21 ENCOUNTER — Ambulatory Visit (INDEPENDENT_AMBULATORY_CARE_PROVIDER_SITE_OTHER): Payer: Medicare HMO | Admitting: Internal Medicine

## 2017-09-21 ENCOUNTER — Encounter: Payer: Self-pay | Admitting: Internal Medicine

## 2017-09-21 VITALS — BP 162/84 | HR 58 | Temp 98.0°F | Resp 12 | Ht 62.75 in | Wt 189.0 lb

## 2017-09-21 DIAGNOSIS — K253 Acute gastric ulcer without hemorrhage or perforation: Secondary | ICD-10-CM | POA: Diagnosis not present

## 2017-09-21 DIAGNOSIS — I10 Essential (primary) hypertension: Secondary | ICD-10-CM | POA: Diagnosis not present

## 2017-09-21 DIAGNOSIS — K222 Esophageal obstruction: Secondary | ICD-10-CM

## 2017-09-21 DIAGNOSIS — K317 Polyp of stomach and duodenum: Secondary | ICD-10-CM | POA: Diagnosis not present

## 2017-09-21 DIAGNOSIS — R1310 Dysphagia, unspecified: Secondary | ICD-10-CM | POA: Diagnosis present

## 2017-09-21 DIAGNOSIS — J45909 Unspecified asthma, uncomplicated: Secondary | ICD-10-CM | POA: Diagnosis not present

## 2017-09-21 DIAGNOSIS — I440 Atrioventricular block, first degree: Secondary | ICD-10-CM | POA: Diagnosis not present

## 2017-09-21 DIAGNOSIS — R42 Dizziness and giddiness: Secondary | ICD-10-CM | POA: Diagnosis not present

## 2017-09-21 MED ORDER — SODIUM CHLORIDE 0.9 % IV SOLN: 500.0000 mL | Freq: Once | INTRAVENOUS | Status: DC

## 2017-09-21 MED ORDER — PANTOPRAZOLE SODIUM 40 MG PO TBEC: 40.0000 mg | DELAYED_RELEASE_TABLET | Freq: Every day | ORAL | 3 refills | Status: DC

## 2017-09-21 MED ORDER — ASPIRIN 81 MG PO TBEC: 81.0000 mg | DELAYED_RELEASE_TABLET | Freq: Every day | ORAL | Status: DC

## 2017-09-21 NOTE — Progress Notes (Signed)
Pt's states no medical or surgical changes since previsit or office visit. 

## 2017-09-21 NOTE — Op Note (Signed)
[place] Patient Name: Jasmine Wheeler Procedure Date: 09/21/2017 3:35 PM MRN: [id number] Endoscopist: Gatha Mayer , MD Age: 72 Referring MD:  Date of Birth: [birth year] Gender: Female Account #: [id number] Procedure:                Upper GI endoscopy Indications:              Dysphagia, Heartburn, Regurgitation Medicines:                Propofol per Anesthesia, Monitored Anesthesia Care Procedure:                Pre-Anesthesia Assessment:                           - Prior to the procedure, a History and Physical                            was performed, and patient medications and                            allergies were reviewed. The patient's tolerance of                            previous anesthesia was also reviewed. The risks                            and benefits of the procedure and the sedation                            options and risks were discussed with the patient.                            All questions were answered, and informed consent                            was obtained. Prior Anticoagulants: The patient                            last took aspirin 1 day prior to the procedure. ASA                            Grade Assessment: III - A patient with severe                            systemic disease. After reviewing the risks and                            benefits, the patient was deemed in satisfactory                            condition to undergo the procedure.                           After obtaining informed consent, the endoscope was  passed under direct vision. Throughout the                            procedure, the patient's blood pressure, pulse, and                            oxygen saturations were monitored continuously. The                            Model GIF-HQ190 [id number]) scope was introduced                            through the mouth, and advanced to the second part   of duodenum. The upper GI endoscopy was                            accomplished without difficulty. The patient                            tolerated the procedure well. Scope In: Scope Out: Findings:                 A single 12 mm pedunculated polyp with no bleeding                            and no stigmata of recent bleeding was found in the                            prepyloric region of the stomach. The polyp was                            removed with a hot snare. Resection and retrieval                            were complete. Verification of patient                            identification for the specimen was done. To                            prevent bleeding after the polypectomy, four                            hemostatic clips were successfully placed (MR                            conditional) (2 pre and 2 post). There was no                            bleeding during the procedure.                           A mild Schatzki ring was found at the  gastroesophageal junction. The scope was withdrawn.                            Dilation was performed with a Maloney dilator with                            mild resistance at 46 Fr. Estimated blood loss:                            none.                           A few dispersed, diminutive non-bleeding erosions                            were found in the gastric antrum. There were no                            stigmata of recent bleeding. Biopsies were taken                            with a cold forceps for Helicobacter pylori testing                            using CLOtest. Verification of patient                            identification for the specimen was done. Estimated                            blood loss was minimal.                           The exam was otherwise without abnormality. Complications:            No immediate complications. Estimated Blood Loss:     Estimated blood loss was  minimal. Impression:               - A single gastric polyp. Resected and retrieved.                            Clips (MR conditional) were placed. IThis was                            moving in and out of pylorus and may be source of                            regurgitation symptoms                           - Mild Schatzki ring. Dilated. 66 Fr                           - Non-bleeding erosive gastropathy. Biopsied.                           -  The examination was otherwise normal. Recommendation:           - Patient has a contact number available for                            emergencies. The signs and symptoms of potential                            delayed complications were discussed with the                            patient. Return to normal activities tomorrow.                            Written discharge instructions were provided to the                            patient.                           - Resume previous diet.                           - Continue present medications.                           - No aspirin, ibuprofen, naproxen, or other                            non-steroidal anti-inflammatory drugs for 2 weeks                            after polyp removal. restart 10/05/17                           - Await pathology results.                           - Use Protonix (pantoprazole) 40 mg PO daily. Gatha Mayer, MD 09/21/2017 4:13:49 PM This report has been signed electronically.

## 2017-09-21 NOTE — Patient Instructions (Addendum)
I found that you had a polyp in the stomach - right where the stomach empties - I removed it - might have been blocking the stomach outlet at times and causing that regurgitation.  I also dilated the esophagus to see if that helps.  I have sent a prescription to [facility] for pantoprazole 40 mg daily. That will reduce risk of bleeding after tjhios and help with your problems I think.  Do not take aspirin again until 10/05/2017.  I will let you know results and see how you are by phone.  I appreciate the opportunity to care for you. Gatha Mayer, MD, [facility]   Follow dilation diet, then resume previous diet.  Begin Protonix 40mg  daily.  Four clips were placed in your stomach, clip card given to partner.  Await biopsies  No aspirin, ibuprofen, naproxen, or other non-steroidal anti-inflammatory drugs for 2 weeks after polyp removal, restart 10/05/2017.     YOU HAD AN ENDOSCOPIC PROCEDURE TODAY AT [facility]:   Refer to the procedure report that was given to you for any specific questions about what was found during the examination.  If the procedure report does not answer your questions, please call your gastroenterologist to clarify.  If you requested that your care partner not be given the details of your procedure findings, then the procedure report has been included in a sealed envelope for you to review at your convenience later.  YOU SHOULD EXPECT: Some feelings of bloating in the abdomen. Passage of more gas than usual.  Walking can help get rid of the air that was put into your GI tract during the procedure and reduce the bloating. If you had a lower endoscopy (such as a colonoscopy or flexible sigmoidoscopy) you may notice spotting of blood in your stool or on the toilet paper. If you underwent a bowel prep for your procedure, you may not have a normal bowel movement for a few days.  Please Note:  You might notice some irritation and congestion in your nose  or some drainage.  This is from the oxygen used during your procedure.  There is no need for concern and it should clear up in a day or so.  SYMPTOMS TO REPORT IMMEDIATELY:     Following upper endoscopy (EGD)  Vomiting of blood or coffee ground material  New chest pain or pain under the shoulder blades  Painful or persistently difficult swallowing  New shortness of breath  Fever of 100F or higher  Black, tarry-looking stools  For urgent or emergent issues, a gastroenterologist can be reached at any hour by calling [phone].   DIET:  Please follow the dilation diet the rest of today, handout was given to your partner.  Drink plenty of fluids but you should avoid alcoholic beverages for 24 hours.  ACTIVITY:  You should plan to take it easy for the rest of today and you should NOT DRIVE or use heavy machinery until tomorrow (because of the sedation medicines used during the test).    FOLLOW UP: Our staff will call the number listed on your records the next business day following your procedure to check on you and address any questions or concerns that you may have regarding the information given to you following your procedure. If we do not reach you, we will leave a message.  However, if you are feeling well and you are not experiencing any problems, there is no need to return our call.  We will  assume that you have returned to your regular daily activities without incident.  If any biopsies were taken you will be contacted by phone or by letter within the next 1-3 weeks.  Please call us at [phone] if you have not heard about the biopsies in 3 weeks.    SIGNATURES/CONFIDENTIALITY: You and/or your care partner have signed paperwork which will be entered into your electronic medical record.  These signatures attest to the fact that that the information above on your After Visit Summary has been reviewed and is understood.  Full responsibility of the confidentiality of this  discharge information lies with you and/or your care-partner.

## 2017-09-21 NOTE — Progress Notes (Signed)
Called to room to assist during endoscopic procedure.  Patient ID and intended procedure confirmed with present staff. Received instructions for my participation in the procedure from the performing physician.  

## 2017-09-21 NOTE — Progress Notes (Signed)
Report to PACU, RN, vss, BBS= Clear.  

## 2017-09-22 ENCOUNTER — Telehealth: Payer: Self-pay | Admitting: *Deleted

## 2017-09-22 DIAGNOSIS — H43813 Vitreous degeneration, bilateral: Secondary | ICD-10-CM | POA: Diagnosis not present

## 2017-09-22 DIAGNOSIS — H4312 Vitreous hemorrhage, left eye: Secondary | ICD-10-CM | POA: Diagnosis not present

## 2017-09-22 LAB — HELICOBACTER PYLORI SCREEN-BIOPSY: UREASE: NEGATIVE

## 2017-09-22 NOTE — Telephone Encounter (Signed)
  Follow up Call-  Call back number 09/21/2017  Post procedure Call Back phone  # [phone]  Permission to leave phone message Yes  Some recent data might be hidden     Patient questions:  Do you have a fever, pain , or abdominal swelling? No. Pain Score  0 *  Have you tolerated food without any problems? Yes.    Have you been able to return to your normal activities? Yes.    Do you have any questions about your discharge instructions: Diet   No. Medications  No. Follow up visit  No.  Do you have questions or concerns about your Care? Yes.    Actions: * If pain score is 4 or above: No action needed, pain <4.

## 2017-09-23 DIAGNOSIS — Z85820 Personal history of malignant melanoma of skin: Secondary | ICD-10-CM | POA: Diagnosis not present

## 2017-09-23 DIAGNOSIS — D2262 Melanocytic nevi of left upper limb, including shoulder: Secondary | ICD-10-CM | POA: Diagnosis not present

## 2017-09-23 DIAGNOSIS — D220 Melanocytic nevi of lip: Secondary | ICD-10-CM | POA: Diagnosis not present

## 2017-09-23 DIAGNOSIS — D1801 Hemangioma of skin and subcutaneous tissue: Secondary | ICD-10-CM | POA: Diagnosis not present

## 2017-09-23 DIAGNOSIS — D225 Melanocytic nevi of trunk: Secondary | ICD-10-CM | POA: Diagnosis not present

## 2017-09-23 DIAGNOSIS — D2261 Melanocytic nevi of right upper limb, including shoulder: Secondary | ICD-10-CM | POA: Diagnosis not present

## 2017-09-23 DIAGNOSIS — L821 Other seborrheic keratosis: Secondary | ICD-10-CM | POA: Diagnosis not present

## 2017-09-23 DIAGNOSIS — D2239 Melanocytic nevi of other parts of face: Secondary | ICD-10-CM | POA: Diagnosis not present

## 2017-09-23 DIAGNOSIS — D2272 Melanocytic nevi of left lower limb, including hip: Secondary | ICD-10-CM | POA: Diagnosis not present

## 2017-09-30 ENCOUNTER — Encounter: Payer: Self-pay | Admitting: Internal Medicine

## 2017-09-30 NOTE — Progress Notes (Signed)
My Chart letter to the patient re: benign hyperplastic polyp and no H pylori

## 2017-10-01 ENCOUNTER — Telehealth: Payer: Self-pay | Admitting: Internal Medicine

## 2017-10-01 NOTE — Telephone Encounter (Signed)
DC dicyclomine from med list Place it into allergies section  Have her try IB Gard 2 bid before breakfast and supper  Needs to schedule f/u for August

## 2017-10-01 NOTE — Telephone Encounter (Signed)
Left message for patient to call me back. 

## 2017-10-01 NOTE — Telephone Encounter (Signed)
Jasmine Wheeler said that the dicyclomine gave her nausea, tingling in her whole body , lights seemed brighter, and she couldn't sleep. I told her not to take any more and that I would be back in touch. She said her stomach is doing okay today.

## 2017-10-01 NOTE — Telephone Encounter (Signed)
Jasmine Wheeler called and she will try the IBgard and she set up an August appointment.

## 2017-10-08 DIAGNOSIS — M5417 Radiculopathy, lumbosacral region: Secondary | ICD-10-CM | POA: Diagnosis not present

## 2017-10-08 DIAGNOSIS — M545 Low back pain: Secondary | ICD-10-CM | POA: Diagnosis not present

## 2017-10-23 DIAGNOSIS — M5137 Other intervertebral disc degeneration, lumbosacral region: Secondary | ICD-10-CM | POA: Diagnosis not present

## 2017-11-07 ENCOUNTER — Other Ambulatory Visit: Payer: Self-pay | Admitting: Internal Medicine

## 2017-11-20 DIAGNOSIS — M545 Low back pain: Secondary | ICD-10-CM | POA: Diagnosis not present

## 2017-11-20 DIAGNOSIS — M5136 Other intervertebral disc degeneration, lumbar region: Secondary | ICD-10-CM | POA: Diagnosis not present

## 2017-11-20 DIAGNOSIS — M5137 Other intervertebral disc degeneration, lumbosacral region: Secondary | ICD-10-CM | POA: Diagnosis not present

## 2017-11-25 DIAGNOSIS — H43812 Vitreous degeneration, left eye: Secondary | ICD-10-CM | POA: Diagnosis not present

## 2017-11-25 DIAGNOSIS — H4312 Vitreous hemorrhage, left eye: Secondary | ICD-10-CM | POA: Diagnosis not present

## 2017-12-11 DIAGNOSIS — M5416 Radiculopathy, lumbar region: Secondary | ICD-10-CM | POA: Diagnosis not present

## 2017-12-11 DIAGNOSIS — M5136 Other intervertebral disc degeneration, lumbar region: Secondary | ICD-10-CM | POA: Diagnosis not present

## 2017-12-16 ENCOUNTER — Ambulatory Visit: Payer: Medicare HMO | Admitting: Internal Medicine

## 2017-12-16 ENCOUNTER — Encounter: Payer: Self-pay | Admitting: Internal Medicine

## 2017-12-16 VITALS — BP 160/84 | HR 72 | Ht 62.5 in | Wt 188.0 lb

## 2017-12-16 DIAGNOSIS — K588 Other irritable bowel syndrome: Secondary | ICD-10-CM

## 2017-12-16 DIAGNOSIS — K219 Gastro-esophageal reflux disease without esophagitis: Secondary | ICD-10-CM | POA: Diagnosis not present

## 2017-12-16 DIAGNOSIS — K222 Esophageal obstruction: Secondary | ICD-10-CM | POA: Diagnosis not present

## 2017-12-16 DIAGNOSIS — K317 Polyp of stomach and duodenum: Secondary | ICD-10-CM | POA: Diagnosis not present

## 2017-12-16 NOTE — Patient Instructions (Signed)
Glad your doing well.   Follow up as needed with Dr Carlean Purl.   Look into finding a less expensive peppermint oil product.    I appreciate the opportunity to care for you. Silvano Rusk, MD, [facility]

## 2017-12-16 NOTE — Progress Notes (Signed)
Jasmine Wheeler 72 y.o. 08-27-1945 [id number]  Assessment & Plan:   Encounter Diagnoses  Name Primary?  . GERD with stricture Yes  . Irritable bowel syndrome   . Gastric polyp      She is improved on all fronts and will continue current medications and see me as needed.  We did discuss the possibility of trying a different version of peppermint oil that might be cheaper which I think is reasonable to try.  I appreciate the opportunity to care for this patient. CC: Jasmine Lor, MD   Subjective:   Chief Complaint: Follow-up of reflux and dysphagia and abdominal pain  HPI The patient is here for follow-up reporting that she is doing well.  She recently had an EGD which showed Schatzki ring that I dilated to 58 [place], and a hyperplastic gastric polyp that was in front of the pylorus which I snared and removed.  She reports that her dysphagia and GERD symptoms are resolved.  IB Jasmine Wheeler is helping her lower abdominal cramps and IBS problems quite a bit and she is satisfied with that though it is expensive.  Review of systems notable for back pain problems she is getting more epidural injections and incidentally her husband is about to have back surgery tomorrow.  He has a herniated disc. Allergies  Allergen Reactions  . Dicyclomine Other (See Comments)    Nausea, tingling in her whole body, couldn't sleep, made lights bright  . Nifedipine     REACTION: unspecified  . Theophylline     REACTION: unspecified   Current Meds  Medication Sig  . albuterol (VENTOLIN HFA) 108 (90 Base) MCG/ACT inhaler INHALE 2 PUFFS EVERY SIX HOURS AS NEEDED FOR WHEEZING OR SHORTNESS OF BREATH  . Ascorbic Acid (VITAMIN C) 500 MG tablet daily. Take 2 tablet daily   . aspirin 81 MG EC tablet Take 1 tablet (81 mg total) by mouth daily. Stop and restart 10/05/2017  . Calcium Carbonate-Vitamin D (CALCIUM-VITAMIN D) 500-200 MG-UNIT per tablet Take 1 tablet by mouth 2 (two) times daily with a meal.     . cetirizine (ZYRTEC) 10 MG tablet Take 10 mg by mouth daily.  . Cranberry 1000 MG CAPS Take by mouth. Take 2 tablets daily   . diltiazem (TIAZAC) 360 MG 24 hr capsule TAKE 1 CAPSULE EVERY DAY  . fish oil-omega-3 fatty acids 1000 MG capsule Take 2 g by mouth 2 (two) times daily.    . fluticasone (FLONASE) 50 MCG/ACT nasal spray USE 2 SPRAYS IN EACH NOSTRIL EVERY DAY  . furosemide (LASIX) 40 MG tablet TAKE 1 TABLET DAILY ONLY IF NEEDED FOR SWELLING  . meclizine (ANTIVERT) 25 MG tablet TAKE ONE TABLET BY MOUTH THREE TIMES DAILY AS NEEDED FOR DIZZINESS  . metoprolol succinate (TOPROL-XL) 50 MG 24 hr tablet TAKE 1 TABLET TWICE DAILY WITH OR IMMEDIATELY FOLLOWING MEALS  . Multiple Vitamin (MULTIVITAMIN) tablet Take 1 tablet by mouth daily.    . pantoprazole (PROTONIX) 40 MG tablet Take 1 tablet (40 mg total) by mouth daily before breakfast.  . Peppermint Oil (IBGARD) 90 MG CPCR Take 2 capsules by mouth 2 (two) times daily.  . quinapril (ACCUPRIL) 40 MG tablet TAKE 1 TABLET TWICE DAILY  . spironolactone (ALDACTONE) 25 MG tablet TAKE 1 TABLET EVERY DAY  . SYNTHROID 150 MCG tablet TAKE 1 TABLET EVERY DAY BEFORE BREAKFAST  . vitamin B-12 (CYANOCOBALAMIN) 500 MCG tablet Take 500 mcg by mouth daily.     Past Medical History:  Diagnosis Date  . Adenomatous colon polyp   . Asthma   . Benign gastric polyp - hyperplastic 09/2017  . Chronic low back pain   . Diverticulosis 01/23/2010   left colon  . Heart murmur 2016  . Hypertension   . Hypothyroidism   . IBS (irritable bowel syndrome)    Past Surgical History:  Procedure Laterality Date  . ABDOMINAL HYSTERECTOMY    . COLONOSCOPY W/ BIOPSIES  multiple  . HEMICOLECTOMY  02/15/10   right, tubulovillous adenoma appendix, Dr. Georgette Dover  . HEMORRHOID BANDING  2014  . LUMBAR EPIDURAL INJECTION  2011   x 2  . MELANOMA EXCISION Left 2012   left arm, not sure about lymph nodes  . REPEAT CESAREAN SECTION     3 in all, last 5   Social History    Social History Narrative   Married retired lives in [place]   No alcohol tobacco or drug use   Caffeine is 2 cups of coffee daily   3 children   Elderly mother lives with her and requires some care    family history includes Colon cancer in her maternal uncle, maternal uncle, and mother; Heart disease in her father; Lung cancer in her father.   Review of Systems As per HPI  Objective:   Physical Exam BP (!) 160/84 (BP Location: Left Arm, Patient Position: Sitting, Cuff Size: Normal)   Pulse 72   Ht 5' 2.5" (1.588 m)   Wt 188 lb (85.3 kg)   BMI 33.84 kg/m   No acute distress  15 minutes time spent with patient > half in counseling coordination of care

## 2017-12-18 ENCOUNTER — Other Ambulatory Visit: Payer: Self-pay

## 2017-12-18 MED ORDER — PANTOPRAZOLE SODIUM 40 MG PO TBEC: 40.0000 mg | DELAYED_RELEASE_TABLET | Freq: Every day | ORAL | 3 refills | Status: DC

## 2017-12-18 NOTE — Telephone Encounter (Signed)
Patient seen recently and office note says continue current medicines so pantoprazole refilled for a year.

## 2017-12-25 ENCOUNTER — Other Ambulatory Visit: Payer: Self-pay

## 2017-12-25 DIAGNOSIS — M5137 Other intervertebral disc degeneration, lumbosacral region: Secondary | ICD-10-CM | POA: Diagnosis not present

## 2017-12-25 MED ORDER — ALBUTEROL SULFATE HFA 108 (90 BASE) MCG/ACT IN AERS: INHALATION_SPRAY | RESPIRATORY_TRACT | 3 refills | Status: DC

## 2017-12-25 NOTE — Telephone Encounter (Signed)
Pt inhaler refilled and sent electronically.

## 2018-01-02 ENCOUNTER — Other Ambulatory Visit: Payer: Self-pay | Admitting: Internal Medicine

## 2018-01-07 ENCOUNTER — Ambulatory Visit (INDEPENDENT_AMBULATORY_CARE_PROVIDER_SITE_OTHER): Payer: Medicare HMO

## 2018-01-07 DIAGNOSIS — Z23 Encounter for immunization: Secondary | ICD-10-CM | POA: Diagnosis not present

## 2018-01-13 DIAGNOSIS — M5136 Other intervertebral disc degeneration, lumbar region: Secondary | ICD-10-CM | POA: Diagnosis not present

## 2018-01-13 DIAGNOSIS — M545 Low back pain: Secondary | ICD-10-CM | POA: Diagnosis not present

## 2018-01-22 DIAGNOSIS — M5136 Other intervertebral disc degeneration, lumbar region: Secondary | ICD-10-CM | POA: Diagnosis not present

## 2018-02-01 DIAGNOSIS — M48061 Spinal stenosis, lumbar region without neurogenic claudication: Secondary | ICD-10-CM | POA: Diagnosis not present

## 2018-02-05 ENCOUNTER — Other Ambulatory Visit: Payer: Self-pay | Admitting: Orthopedic Surgery

## 2018-02-05 DIAGNOSIS — M5136 Other intervertebral disc degeneration, lumbar region: Secondary | ICD-10-CM

## 2018-02-08 ENCOUNTER — Ambulatory Visit (INDEPENDENT_AMBULATORY_CARE_PROVIDER_SITE_OTHER): Payer: Medicare HMO | Admitting: Family Medicine

## 2018-02-08 ENCOUNTER — Encounter: Payer: Self-pay | Admitting: Family Medicine

## 2018-02-08 VITALS — BP 140/80 | HR 63 | Temp 98.3°F | Wt 185.0 lb

## 2018-02-08 DIAGNOSIS — M5442 Lumbago with sciatica, left side: Secondary | ICD-10-CM

## 2018-02-08 DIAGNOSIS — M5441 Lumbago with sciatica, right side: Secondary | ICD-10-CM

## 2018-02-08 DIAGNOSIS — I10 Essential (primary) hypertension: Secondary | ICD-10-CM | POA: Diagnosis not present

## 2018-02-08 DIAGNOSIS — E039 Hypothyroidism, unspecified: Secondary | ICD-10-CM

## 2018-02-08 DIAGNOSIS — Z136 Encounter for screening for cardiovascular disorders: Secondary | ICD-10-CM | POA: Diagnosis not present

## 2018-02-08 LAB — BASIC METABOLIC PANEL
BUN: 26 mg/dL — AB (ref 6–23)
CALCIUM: 9.3 mg/dL (ref 8.4–10.5)
CHLORIDE: 100 meq/L (ref 96–112)
CO2: 30 mEq/L (ref 19–32)
Creatinine, Ser: 1.24 mg/dL — ABNORMAL HIGH (ref 0.40–1.20)
GFR: 44.85 mL/min — AB (ref >60.00)
Glucose, Bld: 160 mg/dL — ABNORMAL HIGH (ref 70–99)
Potassium: 4.3 mEq/L (ref 3.5–5.1)
Sodium: 139 mEq/L (ref 135–145)

## 2018-02-08 LAB — CBC WITH DIFFERENTIAL/PLATELET
Basophils Absolute: 0 10*3/uL (ref 0.0–0.1)
Basophils Relative: 0.3 % (ref 0.0–3.0)
EOS PCT: 2.4 % (ref 0.0–5.0)
Eosinophils Absolute: 0.2 10*3/uL (ref 0.0–0.7)
HCT: 34.4 % — ABNORMAL LOW (ref 36.0–46.0)
Hemoglobin: 11.7 g/dL — ABNORMAL LOW (ref 12.0–15.0)
LYMPHS ABS: 1 10*3/uL (ref 0.7–4.0)
Lymphocytes Relative: 13.8 % (ref 12.0–46.0)
MCHC: 34 g/dL (ref 30.0–36.0)
MCV: 88.8 fl (ref 78.0–100.0)
MONO ABS: 0.5 10*3/uL (ref 0.1–1.0)
Monocytes Relative: 7 % (ref 3.0–12.0)
NEUTROS ABS: 5.6 10*3/uL (ref 1.4–7.7)
NEUTROS PCT: 76.5 % (ref 43.0–77.0)
PLATELETS: 256 10*3/uL (ref 150.0–400.0)
RBC: 3.87 Mil/uL (ref 3.87–5.11)
RDW: 13.9 % (ref 11.5–15.5)
WBC: 7.3 10*3/uL (ref 4.0–10.5)

## 2018-02-08 LAB — TSH: TSH: 1.58 u[IU]/mL (ref 0.35–4.50)

## 2018-02-08 NOTE — Progress Notes (Signed)
Jasmine Wheeler is a 72 year old married female non-smoker who comes in today for medical clearance for spinal disc surgery  She is a patient of Dr. Raliegh Ip who has retired  She has a long-standing history of hypertension.  Is been well controlled with the following medications..... Cardizem 360 mg daily, Lasix 40 mg as needed, Toprol 50 mg twice daily, Accupril 40 mg daily, and spironolactone 25 mg daily.  She states 2 weeks ago she had an episode where she felt lightheaded.  She checked her blood pressure it was 90/50.  She then skipped her evening doses of medication rechecked her pressure the following day and restart her medication.  This is the only episode of hypotension that she has had.  She denies any history of any cardiac disease.  No chest pain shortness of breath with exertion etc. although she cannot walk very much because of severe back pain.  She states her back pain on a scale of 1-10 is a 10.  She also has numerous other medical problems including hypothyroidism reflux esophagitis and IBS.  Her other medical conditions seem to be under good control.  BP 140/80 (BP Location: Right Arm, Patient Position: Sitting, Cuff Size: Large)   Pulse 63   Temp 98.3 F (36.8 C) (Oral)   Wt 185 lb (83.9 kg)   SpO2 98%   BMI 33.30 kg/m  Well-developed will nourished female no acute distress vital signs stable she is afebrile  Pulmonary exam was normal.  Cardiac exam in the supine position........ PMI was not palpable.  S1-S2 within normal limits S2 is physiologically split.  I can appreciate no murmur rubs nor gallops.  Heart sounds are distant.  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  1.  Hypertension at goal with impending lumbar disc surgery.  I would consider her moderate to high risk because of her age and underlying hypertension and abnormal EKG.  Because of this I would recommend we get a cardiac consult prior to surgery.

## 2018-02-08 NOTE — Patient Instructions (Signed)
Labs today................. I will call if there is anything abnormal  Continue current meds  We will get you set up for a cardiac consult ASAP

## 2018-02-19 ENCOUNTER — Ambulatory Visit
Admission: RE | Admit: 2018-02-19 | Discharge: 2018-02-19 | Disposition: A | Discharge status: Home / Self Care | Payer: Medicare HMO | Source: Ambulatory Visit | Attending: Orthopedic Surgery | Admitting: Orthopedic Surgery

## 2018-02-19 ENCOUNTER — Other Ambulatory Visit: Payer: Self-pay | Admitting: Orthopedic Surgery

## 2018-02-19 ENCOUNTER — Ambulatory Visit
Admission: RE | Admit: 2018-02-19 | Discharge: 2018-02-19 | Disposition: A | Discharge status: Home / Self Care | Payer: Self-pay | Source: Ambulatory Visit | Attending: Orthopedic Surgery | Admitting: Orthopedic Surgery

## 2018-02-19 DIAGNOSIS — M5136 Other intervertebral disc degeneration, lumbar region: Secondary | ICD-10-CM

## 2018-02-19 DIAGNOSIS — M48061 Spinal stenosis, lumbar region without neurogenic claudication: Secondary | ICD-10-CM | POA: Diagnosis not present

## 2018-02-19 MED ORDER — DIAZEPAM 5 MG PO TABS
5.0000 mg | ORAL_TABLET | Freq: Once | ORAL | Status: AC
  Administered 2018-02-19: 5 mg via ORAL

## 2018-02-19 MED ORDER — IOHEXOL 180 MG/ML  SOLN
20.0000 mL | Freq: Once | INTRAMUSCULAR | Status: AC | PRN
  Administered 2018-02-19: 20 mL via INTRATHECAL

## 2018-02-19 MED ORDER — MEPERIDINE HCL 50 MG/ML IJ SOLN
50.0000 mg | Freq: Once | INTRAMUSCULAR | Status: AC
  Administered 2018-02-19: 50 mg via INTRAMUSCULAR

## 2018-02-19 MED ORDER — ONDANSETRON HCL 4 MG/2ML IJ SOLN
4.0000 mg | Freq: Once | INTRAMUSCULAR | Status: AC
  Administered 2018-02-19: 4 mg via INTRAMUSCULAR

## 2018-02-19 NOTE — Discharge Instructions (Signed)

## 2018-02-22 DIAGNOSIS — M5136 Other intervertebral disc degeneration, lumbar region: Secondary | ICD-10-CM | POA: Diagnosis not present

## 2018-02-23 ENCOUNTER — Encounter: Payer: Self-pay | Admitting: Cardiovascular Disease

## 2018-02-23 ENCOUNTER — Ambulatory Visit: Payer: Medicare HMO | Admitting: Cardiovascular Disease

## 2018-02-23 VITALS — BP 168/86 | HR 60 | Ht 62.5 in | Wt 183.2 lb

## 2018-02-23 DIAGNOSIS — I712 Thoracic aortic aneurysm, without rupture: Secondary | ICD-10-CM

## 2018-02-23 DIAGNOSIS — I10 Essential (primary) hypertension: Secondary | ICD-10-CM | POA: Diagnosis not present

## 2018-02-23 DIAGNOSIS — I444 Left anterior fascicular block: Secondary | ICD-10-CM

## 2018-02-23 DIAGNOSIS — I7121 Aneurysm of the ascending aorta, without rupture: Secondary | ICD-10-CM

## 2018-02-23 DIAGNOSIS — M9983 Other biomechanical lesions of lumbar region: Secondary | ICD-10-CM | POA: Diagnosis not present

## 2018-02-23 DIAGNOSIS — I440 Atrioventricular block, first degree: Secondary | ICD-10-CM

## 2018-02-23 DIAGNOSIS — M48061 Spinal stenosis, lumbar region without neurogenic claudication: Secondary | ICD-10-CM

## 2018-02-23 DIAGNOSIS — I351 Nonrheumatic aortic (valve) insufficiency: Secondary | ICD-10-CM | POA: Diagnosis not present

## 2018-02-23 MED ORDER — AMLODIPINE BESYLATE 10 MG PO TABS: 10.0000 mg | ORAL_TABLET | Freq: Every day | ORAL | 3 refills | Status: DC

## 2018-02-23 NOTE — Patient Instructions (Signed)
Medication Instructions:  Dr Sallyanne Kuster has recommended making the following medication changes: 1. STOP Diltiazem 2. START Amlodipine 10 mg - take 1 tablet daily  If you need a refill on your cardiac medications before your next appointment, please call your pharmacy.   Testing/Procedures: Echocardiogram - Your physician has requested that you have an echocardiogram. Echocardiography is a painless test that uses sound waves to create images of your heart. It provides your doctor with information about the size and shape of your heart and how well your heart's chambers and valves are working. This procedure takes approximately one hour. There are no restrictions for this procedure.  >> This will be performed at our [facility] location [place], [place] [address] [phone]  Follow-Up: At [facility], you and your health needs are our priority.  As part of our continuing mission to provide you with exceptional heart care, we have created designated Provider Care Teams.  These Care Teams include your primary Cardiologist (physician) and Advanced Practice Providers (APPs -  Physician Assistants and Nurse Practitioners) who all work together to provide you with the care you need, when you need it. You will need a follow up appointment in 4 months.  You may see Sanda Klein, MD or one of the following Advanced Practice Providers on your designated Care Team: [place], [place] . Fabian Sharp, PA-C

## 2018-02-23 NOTE — Progress Notes (Signed)
Cardiology consultation note:    Date:  02/24/2018   ID:  Jasmine, Wheeler 1945/09/21, MRN [id number]  PCP:  Dorothyann Peng, NP  Cardiologist:  Sanda Klein, MD  Electrophysiologist:  None   Referring MD: Stevie Kern, MD  Chief Complaint  Patient presents with  . Abnormal ECG  . Pre-op Exam  Jasmine Wheeler is a 72 y.o. female who is being seen today for the evaluation of abnormal electrocardiogram at the request of Dr. Sherren Mocha and Dr. Gladstone Lighter in anticipation of surgery for spinal stenosis.   History of Present Illness:    Jasmine Wheeler is a 72 y.o. 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  The electrocardiogram performed at Dr. Honor Junes office showed sinus rhythm with a long first-degree AV block and left posterior fascicular block.  Electrocardiogram performed in our office today shows sinus rhythm with a very long first-degree AV block at 336 ms.  She has incomplete right bundle branch block pattern with left anterior fascicular block with a QRS is not really prolonged at 94 ms.  There are some nonspecific ST-T-segment changes but nothing that suggests ischemic heart disease.  I do not think the QRS axis change is due to lead misplacement, it really looks like she has had an axis shift.  In 2015 her ECG showed sinus rhythm with first-degree AV block and left anterior fascicular block, very similar to the one we performed today.  She has no history of cardiac disease.  She denies angina, dyspnea,  syncope, leg edema, claudication or focal neurological complaints.  A few months ago in the summer she felt lightheaded after a birthday party and her blood pressure was low at 96/60 but the next day she felt fine.  She has had occasional palpitations that are brief and otherwise asymptomatic, going on occasionally  for years.  Last June she had some digestive issues and underwent esophagoplasty and gastric polypectomy with Dr. Carlean Purl with resolution of her complaints.  She is on a chronic proton pump inhibitor.  She has significant hypertension and takes multiple agents for control diltiazem 360 mg daily, metoprolol succinate 50 mg twice daily, quinapril 40 mg twice daily, spironolactone 25 mg every day.  She has a prescription for furosemide 40 mg tablets as needed for edema but has not taken any in over 6 months.  She is under a lot of "stress" caring for her 61 year old mother that has cancer and dementia.  Because of a heart murmur she underwent an echocardiogram in 2016.  The echo showed normal left ventricular systolic function with an ejection fraction of 60-65%, left ventricular hypertrophy with mild diastolic dysfunction, a dilated ascending aorta of 44 mm with trivial aortic valve insufficiency.  Her blood pressure is elevated today, but just a few days ago when Dr. Honor Junes office it was better at 140/80.  Past Medical History:  Diagnosis Date  . Adenomatous colon polyp   . Asthma   . Benign gastric polyp - hyperplastic 09/2017  . Chronic low back pain   . Diverticulosis 01/23/2010   left colon  . Heart murmur 2016  . Hypertension   . Hypothyroidism   . IBS (irritable bowel syndrome)     Past Surgical History:  Procedure Laterality Date  . ABDOMINAL HYSTERECTOMY    . COLONOSCOPY W/ BIOPSIES  multiple  . HEMICOLECTOMY  02/15/10   right, tubulovillous adenoma appendix, Dr. Georgette Dover  .  HEMORRHOID BANDING  2014  . LUMBAR EPIDURAL INJECTION  2011   x 2  . MELANOMA EXCISION Left 2012   left arm, not sure about lymph nodes  . REPEAT CESAREAN SECTION     3 in all, last 1973    Current Medications: Current Meds  Medication Sig  . albuterol (VENTOLIN HFA) 108 (90 Base) MCG/ACT inhaler INHALE 2 PUFFS EVERY SIX HOURS AS NEEDED FOR WHEEZING OR SHORTNESS OF BREATH  . Ascorbic Acid (VITAMIN  C) 500 MG tablet daily. Take 2 tablet daily   . aspirin 81 MG EC tablet Take 1 tablet (81 mg total) by mouth daily. Stop and restart 10/05/2017  . Calcium Carbonate-Vitamin D (CALCIUM-VITAMIN D) 500-200 MG-UNIT per tablet Take 1 tablet by mouth 2 (two) times daily with a meal.    . cetirizine (ZYRTEC) 10 MG tablet Take 10 mg by mouth daily.  . Cranberry 1000 MG CAPS Take by mouth. Take 2 tablets daily   . fish oil-omega-3 fatty acids 1000 MG capsule Take 2 g by mouth 2 (two) times daily.    . fluticasone (FLONASE) 50 MCG/ACT nasal spray USE 2 SPRAYS IN EACH NOSTRIL EVERY DAY  . furosemide (LASIX) 40 MG tablet TAKE 1 TABLET DAILY ONLY IF NEEDED FOR SWELLING  . meclizine (ANTIVERT) 25 MG tablet TAKE ONE TABLET BY MOUTH THREE TIMES DAILY AS NEEDED FOR DIZZINESS  . metoprolol succinate (TOPROL-XL) 50 MG 24 hr tablet TAKE 1 TABLET TWICE DAILY WITH OR IMMEDIATELY FOLLOWING MEALS  . Multiple Vitamin (MULTIVITAMIN) tablet Take 1 tablet by mouth daily.    . pantoprazole (PROTONIX) 40 MG tablet Take 1 tablet (40 mg total) by mouth daily before breakfast.  . Peppermint Oil (IBGARD) 90 MG CPCR Take 2 capsules by mouth 2 (two) times daily.  . quinapril (ACCUPRIL) 40 MG tablet TAKE 1 TABLET TWICE DAILY  . spironolactone (ALDACTONE) 25 MG tablet TAKE 1 TABLET EVERY DAY  . SYNTHROID 150 MCG tablet TAKE 1 TABLET EVERY DAY BEFORE BREAKFAST  . vitamin B-12 (CYANOCOBALAMIN) 500 MCG tablet Take 500 mcg by mouth daily.    . [DISCONTINUED] diltiazem (TIAZAC) 360 MG 24 hr capsule TAKE 1 CAPSULE EVERY DAY     Allergies:   Dicyclomine; Nifedipine; and Theophylline   Social History   Socioeconomic History  . Marital status: Married    Spouse name: Not on file  . Number of children: 3  . Years of education: Not on file  . Highest education level: Not on file  Occupational History  . 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 Financial resource strain: Not on file  . Food insecurity:     Worry: Not on file    Inability: Not on file  . Transportation needs:    Medical: Not on file    Non-medical: Not on file  Tobacco Use  . Smoking status: Never Smoker  . Smokeless tobacco: Never Used  Substance and Sexual Activity  . Alcohol use: No  . Drug use: No  . Sexual activity: Not on file  Lifestyle  . Physical activity:    Days per week: Not on file    Minutes per session: Not on file  . Stress: Not on file  Relationships  . Social connections:    Talks on phone: Not on file    Gets together: Not on file    Attends religious service: Not on file    Active member of club or organization: Not on  file    Attends meetings of clubs or organizations: Not on file    Relationship status: Not on file  Other Topics Concern  . Not on file  Social History Narrative   Married retired lives in [place]   No alcohol tobacco or drug use   Caffeine is 2 cups of coffee daily   3 children   Elderly mother lives with her and requires some care      Family History: The patient's family history includes Colon cancer in her maternal uncle, maternal uncle, and mother; Heart disease in her father; Lung cancer in her father.  ROS:   Please see the history of present illness.     All other systems reviewed and are negative.  EKGs/Labs/Other Studies Reviewed:    The following studies were reviewed today: Notes from Dr. Maurice March, Carlean Purl  EKG:  EKG is ordered today.  The ekg ordered today demonstrates sinus rhythm with first-degree AV block (PR interval turn 36 ms), rSr' pattern in lead V1, left anterior fascicular block (QRS 94 ms), nonspecific ST-T wave changes.  Recent Labs: 03/09/2017: ALT 13 02/08/2018: BUN 26; Creatinine, Ser 1.24; Hemoglobin 11.7; Platelets 256.0; Potassium 4.3; Sodium 139; TSH 1.58  Recent Lipid Panel    Component Value Date/Time   CHOL 230 (H) 03/09/2017 0923   TRIG 187.0 (H) 03/09/2017 0923   TRIG 59 02/12/2006 1105   HDL 47.00 03/09/2017  0923   CHOLHDL 5 03/09/2017 0923   VLDL 37.4 03/09/2017 0923   LDLCALC 146 (H) 03/09/2017 0923    Physical Exam:    VS:  BP (!) 168/86   Pulse 60   Ht 5' 2.5" (1.588 m)   Wt 183 lb 3.2 oz (83.1 kg)   BMI 32.97 kg/m      Wt Readings from Last 3 Encounters:  02/23/18 183 lb 3.2 oz (83.1 kg)  02/08/18 185 lb (83.9 kg)  12/16/17 188 lb (85.3 kg)     GEN: Obese, well nourished, well developed in no acute distress HEENT: Normal NECK: No JVD; No carotid bruits LYMPHATICS: No lymphadenopathy CARDIAC: Normal first and second heart sounds RRR, no systolic murmurs, rubs, gallops.  There is a very distinct 2-3/6 decrescendo diastolic murmur of aortic insufficiency heard all along the right sternal border RESPIRATORY:  Clear to auscultation without rales, wheezing or rhonchi  ABDOMEN: Soft, non-tender, non-distended MUSCULOSKELETAL:  No edema; No deformity  SKIN: Warm and dry NEUROLOGIC:  Alert and oriented x 3 PSYCHIATRIC:  Normal affect   ASSESSMENT:    1. Aortic valve insufficiency, etiology of cardiac valve disease unspecified   2. Ascending aortic aneurysm ([place])   3. First degree AV block   4. Left anterior fascicular block   5. Essential hypertension   6. Neuroforaminal stenosis of lumbar spine    PLAN:    In order of problems listed above:  1. AI: Her murmur is quite prominent and cannot be explained by trivial aortic insufficiency.  I think we should repeat an echocardiogram to reevaluate the degree of aortic insufficiency, even though this is unlikely to have any impact on plans for her lumbar spine surgery or any connection with her ECG changes. 2. 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  We will get another evaluation of ascending aortic diameter on her echocardiogram and might have to consider CT angiography for a best evaluation of the thoracic aorta.  Control of her blood pressure remains critically  important. 3. 1st deg AVB:  This is not a physiologically important abnormality at this time and has been present for at least the last 4 years.  Having said that I think it is wisest to stop giving her both a beta-blocker and a centrally acting calcium channel blocker like diltiazem, both of which have negative chronotropic effect.  We will replace her diltiazem with amlodipine (nifedipine as described as an allergy, but the side effect was "dropped blood pressure and heart rate too much", he should have a lot less effect on heart rate than diltiazem.). 4. LAFB/LPFB: She clearly has some intrinsic intraventricular conduction disease, but has never had evidence of higher degree AV block or any symptoms to suggest such.  These abnormalities do not typically suggest coronary insufficiency.  She does not require pacemaker at this time.  Stop diltiazem. 5. HTN: It is critical to maintain normal blood pressure to avoid enlargement of her aortic aneurysm and worsening of aortic insufficiency and left ventricular hypertrophy.  I asked her to send [place] some blood pressure readings after she has been on the amlodipine and start diltiazem for at least 2 weeks. 6. Preop eval: Neither aortic insufficiency nor the ECG abnormalities should be prohibitive for her lumbar spine surgery.  As long as we can ensure good blood pressure control I think she can proceed to back surgery with acceptable risk.  Would like to make sure her blood pressure is consistently controlled before she has back surgery.  It will be very important that her metoprolol is continued without interruption throughout the entire perioperative period.   Medication Adjustments/Labs and Tests Ordered: Current medicines are reviewed at length with the patient today.  Concerns regarding medicines are outlined above.  Orders Placed This Encounter  Procedures  . EKG 12-Lead  . ECHOCARDIOGRAM COMPLETE   Meds ordered this encounter  Medications  . amLODipine  (NORVASC) 10 MG tablet    Sig: Take 1 tablet (10 mg total) by mouth daily.    Dispense:  90 tablet    Refill:  3    Patient Instructions  Medication Instructions:  Dr Sallyanne Kuster has recommended making the following medication changes: 1. STOP Diltiazem 2. START Amlodipine 10 mg - take 1 tablet daily  If you need a refill on your cardiac medications before your next appointment, please call your pharmacy.   Testing/Procedures: Echocardiogram - Your physician has requested that you have an echocardiogram. Echocardiography is a painless test that uses sound waves to create images of your heart. It provides your doctor with information about the size and shape of your heart and how well your heart's chambers and valves are working. This procedure takes approximately one hour. There are no restrictions for this procedure.  >> This will be performed at our [facility] location [place], [place] [address] [phone]  Follow-Up: At [facility], you and your health needs are our priority.  As part of our continuing mission to provide you with exceptional heart care, we have created designated Provider Care Teams.  These Care Teams include your primary Cardiologist (physician) and Advanced Practice Providers (APPs -  Physician Assistants and Nurse Practitioners) who all work together to provide you with the care you need, when you need it. You will need a follow up appointment in 4 months.  You may see Sanda Klein, MD or one of the following Advanced Practice Providers on your designated Care Team: [place], [place] . Fabian Sharp, PA-C    Signed, Sanda Klein, MD  02/24/2018  6:17 PM    [facility]

## 2018-02-24 ENCOUNTER — Encounter: Payer: Self-pay | Admitting: Cardiovascular Disease

## 2018-02-26 ENCOUNTER — Other Ambulatory Visit: Payer: Self-pay | Admitting: Internal Medicine

## 2018-03-04 ENCOUNTER — Ambulatory Visit (HOSPITAL_COMMUNITY): Payer: Medicare HMO | Attending: Internal Medicine

## 2018-03-04 ENCOUNTER — Other Ambulatory Visit: Payer: Self-pay

## 2018-03-04 DIAGNOSIS — I351 Nonrheumatic aortic (valve) insufficiency: Secondary | ICD-10-CM | POA: Insufficient documentation

## 2018-03-10 ENCOUNTER — Ambulatory Visit (INDEPENDENT_AMBULATORY_CARE_PROVIDER_SITE_OTHER): Payer: Medicare HMO | Admitting: Adult Health

## 2018-03-10 ENCOUNTER — Encounter: Payer: Self-pay | Admitting: Adult Health

## 2018-03-10 VITALS — BP 146/80 | Temp 98.0°F | Wt 181.0 lb

## 2018-03-10 DIAGNOSIS — I10 Essential (primary) hypertension: Secondary | ICD-10-CM

## 2018-03-10 DIAGNOSIS — E039 Hypothyroidism, unspecified: Secondary | ICD-10-CM | POA: Diagnosis not present

## 2018-03-10 DIAGNOSIS — Z7689 Persons encountering health services in other specified circumstances: Secondary | ICD-10-CM

## 2018-03-10 NOTE — Progress Notes (Signed)
Patient presents to clinic today to establish care. She is a pleasant 72 year old female who  has a past medical history of Adenomatous colon polyp, Asthma, Benign gastric polyp - hyperplastic (09/2017), Chronic low back pain, Diverticulosis (01/23/2010), Heart murmur (2016), Hypertension, Hypothyroidism, and IBS (irritable bowel syndrome).  She is a prior patient of Dr. Raliegh Ip who was seen biannually and PRN   Her last CPE was in November 2018 - she plans to follow up in Jan 2020   Acute Concerns: Establish Care  Chronic Issues: Essential Hypertension -she is on multiple medications including metoprolol 50 mg twice a day, quinapril 40 mg twice a day, Spironolactone 25 mg daily, and Norvasc 10 mg daily. She monitors her blood at home and reports readings consistently in the 130's/70's. She has a prescription for Lasix to take PRN for lower extremity edema, she has not taken Lasix for about 8 months  BP Readings from Last 3 Encounters:  03/10/18 (!) 146/80  02/23/18 (!) 168/86  02/19/18 133/73   Hypothyroidism -Synthroid 150 mcg every morning Lab Results  Component Value Date   TSH 1.58 02/08/2018   GERD - Chronic PPI therapy with Protonix. Feels controlled.   H/O Melanoma of left arm. Is followed by Dermatology on a yearly basis.   Health Maintenance: Dental -- Routine Care Vision -- Routine Care Immunizations -- utd Colonoscopy -- utd Mammogram -- utd - followed by GYN  PAP -- h/o hysterectomy  Bone Density -- utd - last in 07/2017 - showed osteopenia    Past Medical History:  Diagnosis Date  . Adenomatous colon polyp   . Asthma   . Benign gastric polyp - hyperplastic 09/2017  . Chronic low back pain   . Diverticulosis 01/23/2010   left colon  . Heart murmur 2016  . Hypertension   . Hypothyroidism   . IBS (irritable bowel syndrome)     Past Surgical History:  Procedure Laterality Date  . ABDOMINAL HYSTERECTOMY    . COLONOSCOPY W/ BIOPSIES  multiple  .  HEMICOLECTOMY  02/15/10   right, tubulovillous adenoma appendix, Dr. Georgette Dover  . HEMORRHOID BANDING  2014  . LUMBAR EPIDURAL INJECTION  2011   x 2  . MELANOMA EXCISION Left 2012   left arm, not sure about lymph nodes  . REPEAT CESAREAN SECTION     3 in all, last 1973    Current Outpatient Medications on File Prior to Visit  Medication Sig Dispense Refill  . albuterol (VENTOLIN HFA) 108 (90 Base) MCG/ACT inhaler INHALE 2 PUFFS EVERY SIX HOURS AS NEEDED FOR WHEEZING OR SHORTNESS OF BREATH 54 g 3  . amLODipine (NORVASC) 10 MG tablet Take 1 tablet (10 mg total) by mouth daily. 90 tablet 3  . Ascorbic Acid (VITAMIN C) 500 MG tablet daily. Take 2 tablet daily     . aspirin 81 MG EC tablet Take 1 tablet (81 mg total) by mouth daily. Stop and restart 10/05/2017 30 tablet   . Calcium Carbonate-Vitamin D (CALCIUM-VITAMIN D) 500-200 MG-UNIT per tablet Take 1 tablet by mouth 2 (two) times daily with a meal.      . cetirizine (ZYRTEC) 10 MG tablet Take 10 mg by mouth daily.    . Cranberry 1000 MG CAPS Take by mouth. Take 2 tablets daily     . fish oil-omega-3 fatty acids 1000 MG capsule Take 2 g by mouth 2 (two) times daily.      . fluticasone (FLONASE) 50 MCG/ACT nasal spray USE 2  SPRAYS IN EACH NOSTRIL EVERY DAY 48 g 6  . furosemide (LASIX) 40 MG tablet TAKE 1 TABLET DAILY ONLY IF NEEDED FOR SWELLING 90 tablet 1  . meclizine (ANTIVERT) 25 MG tablet TAKE ONE TABLET BY MOUTH THREE TIMES DAILY AS NEEDED FOR DIZZINESS 60 tablet 0  . metoprolol succinate (TOPROL-XL) 50 MG 24 hr tablet TAKE 1 TABLET TWICE DAILY WITH OR IMMEDIATELY FOLLOWING MEALS 180 tablet 0  . Multiple Vitamin (MULTIVITAMIN) tablet Take 1 tablet by mouth daily.      . pantoprazole (PROTONIX) 40 MG tablet Take 1 tablet (40 mg total) by mouth daily before breakfast. 90 tablet 3  . Peppermint Oil (IBGARD) 90 MG CPCR Take 2 capsules by mouth 2 (two) times daily.    . quinapril (ACCUPRIL) 40 MG tablet TAKE 1 TABLET TWICE DAILY 180 tablet 1  .  spironolactone (ALDACTONE) 25 MG tablet TAKE 1 TABLET EVERY DAY 90 tablet 0  . SYNTHROID 150 MCG tablet TAKE 1 TABLET EVERY DAY BEFORE BREAKFAST 90 tablet 3  . vitamin B-12 (CYANOCOBALAMIN) 500 MCG tablet Take 500 mcg by mouth daily.      . [DISCONTINUED] diltiazem (CARDIZEM CD) 360 MG 24 hr capsule Take 1 capsule (360 mg total) by mouth daily. 90 capsule 3   No current facility-administered medications on file prior to visit.     Allergies  Allergen Reactions  . Dicyclomine Nausea Only and Other (See Comments)    Nausea, tingling in her whole body, couldn't sleep, made lights bright  . Nifedipine Other (See Comments)    Dropped blood pressure and heart rate too much  . Theophylline Other (See Comments)    Patient cannot recall reaction    Family History  Problem Relation Age of Onset  . Lung cancer Father   . Heart disease Father   . Colon cancer Mother   . Colon cancer Maternal Uncle   . Colon cancer Maternal Uncle     Social History   Socioeconomic History  . Marital status: Married    Spouse name: Not on file  . Number of children: 3  . Years of education: Not on file  . Highest education level: Not on file  Occupational History  . 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 Financial resource strain: Not on file  . Food insecurity:    Worry: Not on file    Inability: Not on file  . Transportation needs:    Medical: Not on file    Non-medical: Not on file  Tobacco Use  . Smoking status: Never Smoker  . Smokeless tobacco: Never Used  Substance and Sexual Activity  . Alcohol use: No  . Drug use: No  . Sexual activity: Not on file  Lifestyle  . Physical activity:    Days per week: Not on file    Minutes per session: Not on file  . Stress: Not on file  Relationships  . Social connections:    Talks on phone: Not on file    Gets together: Not on file    Attends religious service: Not on file    Active member of club or organization:  Not on file    Attends meetings of clubs or organizations: Not on file    Relationship status: Not on file  . Intimate partner violence:    Fear of current or ex partner: Not on file    Emotionally abused: Not on file    Physically abused: Not  on file    Forced sexual activity: Not on file  Other Topics Concern  . Not on file  Social History Narrative   Married retired lives in [place]   No alcohol tobacco or drug use   Caffeine is 2 cups of coffee daily   3 children   Elderly mother lives with her and requires some care     Review of Systems  Constitutional: Negative.   HENT: Negative.   Eyes: Negative.   Respiratory: Negative.   Cardiovascular: Negative.   Gastrointestinal: Negative.   Genitourinary: Negative.   Musculoskeletal: Positive for back pain.  Skin: Negative.   Neurological: Negative.   Endo/Heme/Allergies: Negative.   Psychiatric/Behavioral: Negative.   All other systems reviewed and are negative.     BP (!) 146/80   Temp 98 F (36.7 C)   Wt 181 lb (82.1 kg)   BMI 32.58 kg/m   Physical Exam  Constitutional: She is oriented to person, place, and time. She appears well-developed and well-nourished. No distress.  HENT:  Head: Normocephalic and atraumatic.  Right Ear: External ear normal.  Left Ear: External ear normal.  Nose: Nose normal.  Mouth/Throat: Oropharynx is clear and moist. No oropharyngeal exudate.  Eyes: Pupils are equal, round, and reactive to light. Conjunctivae and EOM are normal. Right eye exhibits no discharge. Left eye exhibits no discharge. No scleral icterus.  Neck: Normal range of motion. Neck supple. No JVD present. No tracheal deviation present. No thyromegaly present.  Cardiovascular: Normal rate, regular rhythm, normal heart sounds and intact distal pulses. Exam reveals no gallop and no friction rub.  No murmur heard. Pulmonary/Chest: Effort normal and breath sounds normal. No stridor. No respiratory distress. She has no  wheezes. She has no rales. She exhibits no tenderness.  Abdominal: Soft. Bowel sounds are normal. She exhibits no distension and no mass. There is no tenderness. There is no rebound and no guarding. No hernia.  Musculoskeletal: Normal range of motion. She exhibits no edema, tenderness or deformity.  Lymphadenopathy:    She has no cervical adenopathy.  Neurological: She is alert and oriented to person, place, and time. She displays normal reflexes. No cranial nerve deficit or sensory deficit. She exhibits normal muscle tone. Coordination normal.  Skin: Skin is warm and dry. No rash noted. She is not diaphoretic. No erythema. No pallor.  Psychiatric: She has a normal mood and affect. Her behavior is normal. Judgment and thought content normal.  Nursing note and vitals reviewed.   Recent Results (from the past 2160 hour(s))  CBC with Differential/Platelet     Status: Abnormal   Collection Time: 02/08/18  9:46 AM  Result Value Ref Range   WBC 7.3 4.0 - 10.5 K/uL   RBC 3.87 3.87 - 5.11 Mil/uL   Hemoglobin 11.7 (L) 12.0 - 15.0 g/dL   HCT 34.4 (L) 36.0 - 46.0 %   MCV 88.8 78.0 - 100.0 fl   MCHC 34.0 30.0 - 36.0 g/dL   RDW 13.9 11.5 - 15.5 %   Platelets 256.0 150.0 - 400.0 K/uL   Neutrophils Relative % 76.5 43.0 - 77.0 %   Lymphocytes Relative 13.8 12.0 - 46.0 %   Monocytes Relative 7.0 3.0 - 12.0 %   Eosinophils Relative 2.4 0.0 - 5.0 %   Basophils Relative 0.3 0.0 - 3.0 %   Neutro Abs 5.6 1.4 - 7.7 K/uL   Lymphs Abs 1.0 0.7 - 4.0 K/uL   Monocytes Absolute 0.5 0.1 - 1.0 K/uL   Eosinophils  Absolute 0.2 0.0 - 0.7 K/uL   Basophils Absolute 0.0 0.0 - 0.1 K/uL  TSH     Status: None   Collection Time: 02/08/18  9:46 AM  Result Value Ref Range   TSH 1.58 0.35 - 4.50 uIU/mL  Basic metabolic panel     Status: Abnormal   Collection Time: 02/08/18  9:46 AM  Result Value Ref Range   Sodium 139 135 - 145 mEq/L   Potassium 4.3 3.5 - 5.1 mEq/L   Chloride 100 96 - 112 mEq/L   CO2 30 19 - 32 mEq/L    Glucose, Bld 160 (H) 70 - 99 mg/dL   BUN 26 (H) 6 - 23 mg/dL   Creatinine, Ser 1.24 (H) 0.40 - 1.20 mg/dL   Calcium 9.3 8.4 - 10.5 mg/dL   GFR 44.85 (L) >60.00 mL/min    Assessment/Plan: 1. Encounter to establish care - Follow up in January 2020 for CPE or sooner if needed - work on diet and exercise   2. Essential hypertension - better controlled at home.  - Will continue to monitor.    3. Acquired hypothyroidism - Well controlled. Continue with current dose of Synthroid   Dorothyann Peng, NP

## 2018-03-15 ENCOUNTER — Other Ambulatory Visit: Payer: Self-pay | Admitting: Internal Medicine

## 2018-03-15 NOTE — Telephone Encounter (Signed)
Medication was filled by Dr. Sherren Mocha on 02/26/18.  Refill request is too early.

## 2018-03-15 NOTE — Telephone Encounter (Signed)
Message routed to PCP CMA for refills. 

## 2018-03-23 ENCOUNTER — Ambulatory Visit: Payer: Medicare HMO | Admitting: Cardiology

## 2018-04-05 ENCOUNTER — Other Ambulatory Visit: Payer: Self-pay | Admitting: Cardiovascular Disease

## 2018-04-05 NOTE — Telephone Encounter (Signed)
°*  STAT* If patient is at the pharmacy, call can be transferred to refill team.   1. Which medications need to be refilled? (please list name of each medication and dose if known) amlodipine 10 mg  2. Which pharmacy/location (including street and city if local pharmacy) is medication to be sent to? [facility] pharmacy  3. Do they need a 30 day or 90 day supply? 90  This medication was sent to [facility] but patient would like for it to go threw [place].

## 2018-04-06 MED ORDER — AMLODIPINE BESYLATE 10 MG PO TABS: 10.0000 mg | ORAL_TABLET | Freq: Every day | ORAL | 1 refills | Status: DC

## 2018-04-27 ENCOUNTER — Other Ambulatory Visit: Payer: Self-pay | Admitting: Internal Medicine

## 2018-04-30 ENCOUNTER — Other Ambulatory Visit: Payer: Self-pay | Admitting: Internal Medicine

## 2018-04-30 NOTE — Telephone Encounter (Signed)
metoprolol succinate (TOPROL-XL) 50 MG 24 hr tablet  SYNTHROID 150 MCG tablet   Pt called in and stated she also need this one sent to:  [place], [place] [phone] (Phone)

## 2018-05-06 ENCOUNTER — Encounter: Payer: Self-pay | Admitting: Adult Health

## 2018-05-06 ENCOUNTER — Ambulatory Visit (INDEPENDENT_AMBULATORY_CARE_PROVIDER_SITE_OTHER): Payer: Medicare HMO | Admitting: Adult Health

## 2018-05-06 VITALS — BP 140/80 | Temp 97.9°F | Ht 62.0 in | Wt 176.0 lb

## 2018-05-06 DIAGNOSIS — Z0000 Encounter for general adult medical examination without abnormal findings: Secondary | ICD-10-CM | POA: Diagnosis not present

## 2018-05-06 DIAGNOSIS — E039 Hypothyroidism, unspecified: Secondary | ICD-10-CM | POA: Diagnosis not present

## 2018-05-06 DIAGNOSIS — R7302 Impaired glucose tolerance (oral): Secondary | ICD-10-CM

## 2018-05-06 DIAGNOSIS — I10 Essential (primary) hypertension: Secondary | ICD-10-CM

## 2018-05-06 DIAGNOSIS — K219 Gastro-esophageal reflux disease without esophagitis: Secondary | ICD-10-CM

## 2018-05-06 LAB — TSH: TSH: 0.04 u[IU]/mL — ABNORMAL LOW (ref 0.35–4.50)

## 2018-05-06 LAB — CBC WITH DIFFERENTIAL/PLATELET
BASOS PCT: 0.6 % (ref 0.0–3.0)
Basophils Absolute: 0 10*3/uL (ref 0.0–0.1)
EOS PCT: 4.1 % (ref 0.0–5.0)
Eosinophils Absolute: 0.2 10*3/uL (ref 0.0–0.7)
HEMATOCRIT: 35 % — AB (ref 36.0–46.0)
Hemoglobin: 11.8 g/dL — ABNORMAL LOW (ref 12.0–15.0)
LYMPHS ABS: 1.1 10*3/uL (ref 0.7–4.0)
LYMPHS PCT: 27.1 % (ref 12.0–46.0)
MCHC: 33.8 g/dL (ref 30.0–36.0)
MCV: 87.7 fl (ref 78.0–100.0)
MONOS PCT: 9.3 % (ref 3.0–12.0)
Monocytes Absolute: 0.4 10*3/uL (ref 0.1–1.0)
Neutro Abs: 2.4 10*3/uL (ref 1.4–7.7)
Neutrophils Relative %: 58.9 % (ref 43.0–77.0)
PLATELETS: 215 10*3/uL (ref 150.0–400.0)
RBC: 3.99 Mil/uL (ref 3.87–5.11)
RDW: 13.2 % (ref 11.5–15.5)
WBC: 4.1 10*3/uL (ref 4.0–10.5)

## 2018-05-06 LAB — LIPID PANEL
CHOLESTEROL: 202 mg/dL — AB (ref 0–200)
HDL: 43.9 mg/dL (ref >39.00)
LDL Cholesterol: 128 mg/dL — ABNORMAL HIGH (ref 0–99)
NonHDL: 157.78
TRIGLYCERIDES: 147 mg/dL (ref 0.0–149.0)
Total CHOL/HDL Ratio: 5
VLDL: 29.4 mg/dL (ref 0.0–40.0)

## 2018-05-06 LAB — HEPATIC FUNCTION PANEL
ALBUMIN: 4.2 g/dL (ref 3.5–5.2)
ALK PHOS: 77 U/L (ref 39–117)
ALT: 10 U/L (ref 0–35)
AST: 12 U/L (ref 0–37)
Bilirubin, Direct: 0.1 mg/dL (ref 0.0–0.3)
TOTAL PROTEIN: 6.2 g/dL (ref 6.0–8.3)
Total Bilirubin: 0.5 mg/dL (ref 0.2–1.2)

## 2018-05-06 LAB — BASIC METABOLIC PANEL
BUN: 21 mg/dL (ref 6–23)
CHLORIDE: 104 meq/L (ref 96–112)
CO2: 31 meq/L (ref 19–32)
Calcium: 9.4 mg/dL (ref 8.4–10.5)
Creatinine, Ser: 1.16 mg/dL (ref 0.40–1.20)
GFR: 45.55 mL/min — ABNORMAL LOW (ref >60.00)
GLUCOSE: 87 mg/dL (ref 70–99)
Potassium: 4.1 mEq/L (ref 3.5–5.1)
SODIUM: 141 meq/L (ref 135–145)

## 2018-05-06 LAB — HEMOGLOBIN A1C: Hgb A1c MFr Bld: 5.7 % (ref 4.6–6.5)

## 2018-05-06 NOTE — Progress Notes (Signed)
Subjective:    Patient ID: Jasmine Wheeler, female    DOB: Apr 21, 1946, 73 y.o.   MRN: [id number]  HPI  Patient presents for yearly preventative medicine examination. She is a pleasant 73 year old who  has a past medical history of Adenomatous colon polyp, Asthma, Benign gastric polyp - hyperplastic (09/2017), Chronic low back pain, Diverticulosis (01/23/2010), Heart murmur (2016), Hypertension, Hypothyroidism, and IBS (irritable bowel syndrome).   Essential Hypertension -she takes multiple medications including metoprolol 50 mg twice daily, quinapril 40 mg twice daily, Spironolactone 25 mg daily, and Norvasc 10 mg daily.  She does monitor her blood pressure at home and reports readings consistently in the 120s over 70s.  She has a prescription for Lasix to take as needed for lower extremity edema and elevated blood pressure readings but has not taken Lasix for about 9 months  BP Readings from Last 3 Encounters:  05/06/18 140/80  03/10/18 (!) 146/80  02/23/18 (!) 168/86   Hypothyroidism -takes Synthroid 150 mcg every morning. She reports that she feels as though her hair is thinning. Denies any other symptoms Lab Results  Component Value Date   TSH 1.58 02/08/2018     GERD -on a PPI therapy with Protonix.  Feels well controlled  H/O Melanoma of left arm -by dermatology on a yearly basis  All immunizations and health maintenance protocols were reviewed with the patient and needed orders were placed- up to date   Appropriate screening laboratory values were ordered for the patient including screening of hyperlipidemia, renal function and hepatic function.  Medication reconciliation,  past medical history, social history, problem list and allergies were reviewed in detail with the patient  Goals were established with regard to weight loss, exercise, and  diet in compliance with medications. She has been working on her diet and is eating less carbs and sugars.   Wt Readings from  Last 3 Encounters:  05/06/18 176 lb (79.8 kg)  03/10/18 181 lb (82.1 kg)  02/23/18 183 lb 3.2 oz (83.1 kg)    End of life planning was discussed. Information on living will and AD given to patient.   She is utd on health maintenance items such as colonoscopy, mammogram, bone density screen, and vision/dental screens.   Review of Systems  Constitutional: Negative.   HENT: Negative.   Eyes: Negative.   Respiratory: Negative.   Cardiovascular: Negative.   Gastrointestinal: Negative.   Endocrine: Negative.   Genitourinary: Negative.   Musculoskeletal: Positive for back pain.  Skin: Negative.   Allergic/Immunologic: Negative.   Neurological: Negative.   Hematological: Negative.   Psychiatric/Behavioral: Negative.    Past Medical History:  Diagnosis Date  . Adenomatous colon polyp   . Asthma   . Benign gastric polyp - hyperplastic 09/2017  . Chronic low back pain   . Diverticulosis 01/23/2010   left colon  . Heart murmur 2016  . Hypertension   . Hypothyroidism   . IBS (irritable bowel syndrome)     Social History   Socioeconomic History  . Marital status: Married    Spouse name: Not on file  . Number of children: 3  . Years of education: Not on file  . Highest education level: Not on file  Occupational History  . 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 Financial resource strain: Not on file  . Food insecurity:    Worry: Not on file    Inability: Not on file  . Transportation needs:  Medical: Not on file    Non-medical: Not on file  Tobacco Use  . Smoking status: Never Smoker  . Smokeless tobacco: Never Used  Substance and Sexual Activity  . Alcohol use: No  . Drug use: No  . Sexual activity: Not on file  Lifestyle  . Physical activity:    Days per week: Not on file    Minutes per session: Not on file  . Stress: Not on file  Relationships  . Social connections:    Talks on phone: Not on file    Gets together: Not on file      Attends religious service: Not on file    Active member of club or organization: Not on file    Attends meetings of clubs or organizations: Not on file    Relationship status: Not on file  . Intimate partner violence:    Fear of current or ex partner: Not on file    Emotionally abused: Not on file    Physically abused: Not on file    Forced sexual activity: Not on file  Other Topics Concern  . Not on file  Social History Narrative   Married retired lives in [place]   No alcohol tobacco or drug use   Caffeine is 2 cups of coffee daily   3 children   Elderly mother lives with her and requires some care     Past Surgical History:  Procedure Laterality Date  . ABDOMINAL HYSTERECTOMY    . COLONOSCOPY W/ BIOPSIES  multiple  . HEMICOLECTOMY  02/15/10   right, tubulovillous adenoma appendix, Dr. Georgette Dover  . HEMORRHOID BANDING  2014  . LUMBAR EPIDURAL INJECTION  2011   x 2  . MELANOMA EXCISION Left 2012   left arm, not sure about lymph nodes  . REPEAT CESAREAN SECTION     3 in all, last 48    Family History  Problem Relation Age of Onset  . Lung cancer Father   . Heart disease Father   . Colon cancer Mother   . Colon cancer Maternal Uncle   . Colon cancer Maternal Uncle     Allergies  Allergen Reactions  . Dicyclomine Nausea Only and Other (See Comments)    Nausea, tingling in her whole body, couldn't sleep, made lights bright  . Nifedipine Other (See Comments)    Dropped blood pressure and heart rate too much  . Theophylline Other (See Comments)    Patient cannot recall reaction    Current Outpatient Medications on File Prior to Visit  Medication Sig Dispense Refill  . albuterol (VENTOLIN HFA) 108 (90 Base) MCG/ACT inhaler INHALE 2 PUFFS EVERY SIX HOURS AS NEEDED FOR WHEEZING OR SHORTNESS OF BREATH 54 g 3  . amLODipine (NORVASC) 10 MG tablet Take 1 tablet (10 mg total) by mouth daily. 90 tablet 1  . Ascorbic Acid (VITAMIN C) 500 MG tablet daily. Take 2 tablet  daily     . aspirin 81 MG EC tablet Take 1 tablet (81 mg total) by mouth daily. Stop and restart 10/05/2017 30 tablet   . Calcium Carbonate-Vitamin D (CALCIUM-VITAMIN D) 500-200 MG-UNIT per tablet Take 1 tablet by mouth 2 (two) times daily with a meal.      . cetirizine (ZYRTEC) 10 MG tablet Take 10 mg by mouth daily.    . Cranberry 1000 MG CAPS Take by mouth. Take 2 tablets daily     . fish oil-omega-3 fatty acids 1000 MG capsule Take 2 g by mouth 2 (  two) times daily.      . fluticasone (FLONASE) 50 MCG/ACT nasal spray USE 2 SPRAYS IN EACH NOSTRIL EVERY DAY 48 g 6  . furosemide (LASIX) 40 MG tablet TAKE 1 TABLET DAILY ONLY IF NEEDED FOR SWELLING 90 tablet 1  . metoprolol succinate (TOPROL-XL) 50 MG 24 hr tablet TAKE 1 TABLET TWICE DAILY WITH OR IMMEDIATELY FOLLOWING MEALS 180 tablet 0  . Multiple Vitamin (MULTIVITAMIN) tablet Take 1 tablet by mouth daily.      . pantoprazole (PROTONIX) 40 MG tablet Take 1 tablet (40 mg total) by mouth daily before breakfast. 90 tablet 3  . Peppermint Oil (IBGARD) 90 MG CPCR Take 2 capsules by mouth 2 (two) times daily.    . quinapril (ACCUPRIL) 40 MG tablet TAKE 1 TABLET TWICE DAILY 180 tablet 1  . spironolactone (ALDACTONE) 25 MG tablet TAKE 1 TABLET EVERY DAY 90 tablet 0  . SYNTHROID 150 MCG tablet TAKE 1 TABLET EVERY DAY BEFORE BREAKFAST 90 tablet 1  . vitamin B-12 (CYANOCOBALAMIN) 500 MCG tablet Take 500 mcg by mouth daily.       No current facility-administered medications on file prior to visit.     BP 140/80 Comment: WITHOUT MEDICATION  Temp 97.9 F (36.6 C)   Ht 5\' 2"  (1.575 m) Comment: WITHOUT SHOES  Wt 176 lb (79.8 kg)   BMI 32.19 kg/m       Objective:   Physical Exam Vitals signs and nursing note reviewed.  Constitutional:      General: She is not in acute distress.    Appearance: Normal appearance. She is well-developed and normal weight.  HENT:     Head: Normocephalic and atraumatic.     Comments: Thinning hair noted    Right Ear:  Tympanic membrane, ear canal and external ear normal. There is no impacted cerumen.     Left Ear: Tympanic membrane, ear canal and external ear normal. There is no impacted cerumen.     Nose: Nose normal. No congestion or rhinorrhea.     Mouth/Throat:     Mouth: Mucous membranes are moist.     Pharynx: Oropharynx is clear. No oropharyngeal exudate.  Eyes:     General: No scleral icterus.       Right eye: No discharge.        Left eye: No discharge.     Extraocular Movements: Extraocular movements intact.     Conjunctiva/sclera: Conjunctivae normal.     Pupils: Pupils are equal, round, and reactive to light.  Neck:     Musculoskeletal: Normal range of motion and neck supple. No neck rigidity or muscular tenderness.     Thyroid: No thyromegaly.     Vascular: No carotid bruit.     Trachea: No tracheal deviation.  Cardiovascular:     Rate and Rhythm: Normal rate and regular rhythm.     Pulses: Normal pulses.     Heart sounds: Normal heart sounds. No murmur. No friction rub. No gallop.   Pulmonary:     Effort: Pulmonary effort is normal. No respiratory distress.     Breath sounds: Normal breath sounds. No stridor. No wheezing, rhonchi or rales.  Chest:     Chest wall: No tenderness.  Abdominal:     General: Bowel sounds are normal. There is no distension.     Palpations: Abdomen is soft. There is no mass.     Tenderness: There is no abdominal tenderness. There is no right CVA tenderness, left CVA tenderness, guarding or rebound.  Hernia: No hernia is present.  Genitourinary:    Comments: Done by GYN   Musculoskeletal: Normal range of motion.        General: No swelling, tenderness, deformity or signs of injury.     Right lower leg: No edema.     Left lower leg: No edema.  Lymphadenopathy:     Cervical: No cervical adenopathy.  Skin:    General: Skin is warm and dry.     Coloration: Skin is not jaundiced or pale.     Findings: No bruising, erythema, lesion or rash.    Neurological:     General: No focal deficit present.     Mental Status: She is alert and oriented to person, place, and time. Mental status is at baseline.     Cranial Nerves: No cranial nerve deficit.     Sensory: No sensory deficit.     Motor: No weakness.     Coordination: Coordination normal.     Gait: Gait normal.     Deep Tendon Reflexes: Reflexes normal.  Psychiatric:        Mood and Affect: Mood normal.        Behavior: Behavior normal.        Thought Content: Thought content normal.        Judgment: Judgment normal.       Assessment & Plan:  1. Routine general medical examination at a health care facility - Congratulated on weight loss. Continue to work on diet and exercise.  - Follow up in 6 months for routine follow up - Basic metabolic panel - CBC with Differential/Platelet - Hepatic function panel - Lipid panel - TSH - Hemoglobin A1c  2. Essential hypertension - much better controlled at home.  - Continue to monitor.  - Return precautions given  - Basic metabolic panel - CBC with Differential/Platelet - Hepatic function panel - Lipid panel - TSH - Hemoglobin A1c  3. Acquired hypothyroidism - Consider dose change of synthroid  - Basic metabolic panel - CBC with Differential/Platelet - Hepatic function panel - Lipid panel - TSH - Hemoglobin A1c  4. Impaired glucose tolerance - Basic metabolic panel - CBC with Differential/Platelet - Hepatic function panel - Lipid panel - TSH - Hemoglobin A1c  5. Gastroesophageal reflux disease without esophagitis - Continue with Protonix   Dorothyann Peng, NP

## 2018-05-07 ENCOUNTER — Other Ambulatory Visit: Payer: Self-pay | Admitting: Family Medicine

## 2018-05-07 MED ORDER — SYNTHROID 137 MCG PO TABS: 137.0000 ug | ORAL_TABLET | Freq: Every day | ORAL | 0 refills | Status: DC

## 2018-05-12 ENCOUNTER — Encounter: Payer: Self-pay | Admitting: Adult Health

## 2018-05-14 ENCOUNTER — Other Ambulatory Visit: Payer: Self-pay | Admitting: *Deleted

## 2018-05-14 DIAGNOSIS — E039 Hypothyroidism, unspecified: Secondary | ICD-10-CM

## 2018-05-21 ENCOUNTER — Encounter: Payer: Self-pay | Admitting: Adult Health

## 2018-05-21 ENCOUNTER — Ambulatory Visit (INDEPENDENT_AMBULATORY_CARE_PROVIDER_SITE_OTHER): Payer: Medicare HMO | Admitting: Adult Health

## 2018-05-21 VITALS — BP 150/80 | Temp 98.1°F | Wt 179.0 lb

## 2018-05-21 DIAGNOSIS — R300 Dysuria: Secondary | ICD-10-CM

## 2018-05-21 LAB — POC URINALSYSI DIPSTICK (AUTOMATED)
Bilirubin, UA: NEGATIVE
Glucose, UA: NEGATIVE
Ketones, UA: NEGATIVE
Leukocytes, UA: NEGATIVE
NITRITE UA: NEGATIVE
PH UA: 6 (ref 5.0–8.0)
PROTEIN UA: NEGATIVE
RBC UA: NEGATIVE
Spec Grav, UA: 1.015 (ref 1.010–1.025)
Urobilinogen, UA: 0.2 E.U./dL

## 2018-05-21 NOTE — Addendum Note (Signed)
Addended by: Gwynne Edinger on: 05/21/2018 03:03 PM   Modules accepted: Orders

## 2018-05-21 NOTE — Progress Notes (Signed)
Subjective:    Patient ID: Jasmine Wheeler, female    DOB: 08-21-1945, 73 y.o.   MRN: [id number]  HPI  73 year old female who  has a past medical history of Adenomatous colon polyp, Asthma, Benign gastric polyp - hyperplastic (09/2017), Chronic low back pain, Diverticulosis (01/23/2010), Heart murmur (2016), Hypertension, Hypothyroidism, and IBS (irritable bowel syndrome).  She presents to the office today for an acute issue of possible UTI. Her symptoms include that of mild dysuria, frequency, urgency, decreased stream ,and a burning sensation in her bladder. She denies low back pain ( past baseline) or hematuria.    She has been drinking cranberry juice.   Review of Systems See HPI   Past Medical History:  Diagnosis Date  . Adenomatous colon polyp   . Asthma   . Benign gastric polyp - hyperplastic 09/2017  . Chronic low back pain   . Diverticulosis 01/23/2010   left colon  . Heart murmur 2016  . Hypertension   . Hypothyroidism   . IBS (irritable bowel syndrome)     Social History   Socioeconomic History  . Marital status: Married    Spouse name: Not on file  . Number of children: 3  . Years of education: Not on file  . Highest education level: Not on file  Occupational History  . 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 Financial resource strain: Not on file  . Food insecurity:    Worry: Not on file    Inability: Not on file  . Transportation needs:    Medical: Not on file    Non-medical: Not on file  Tobacco Use  . Smoking status: Never Smoker  . Smokeless tobacco: Never Used  Substance and Sexual Activity  . Alcohol use: No  . Drug use: No  . Sexual activity: Not on file  Lifestyle  . Physical activity:    Days per week: Not on file    Minutes per session: Not on file  . Stress: Not on file  Relationships  . Social connections:    Talks on phone: Not on file    Gets together: Not on file    Attends religious service:  Not on file    Active member of club or organization: Not on file    Attends meetings of clubs or organizations: Not on file    Relationship status: Not on file  . Intimate partner violence:    Fear of current or ex partner: Not on file    Emotionally abused: Not on file    Physically abused: Not on file    Forced sexual activity: Not on file  Other Topics Concern  . Not on file  Social History Narrative   Married retired lives in [place]   No alcohol tobacco or drug use   Caffeine is 2 cups of coffee daily   3 children   Elderly mother lives with her and requires some care     Past Surgical History:  Procedure Laterality Date  . ABDOMINAL HYSTERECTOMY    . COLONOSCOPY W/ BIOPSIES  multiple  . HEMICOLECTOMY  02/15/10   right, tubulovillous adenoma appendix, Dr. Georgette Dover  . HEMORRHOID BANDING  2014  . LUMBAR EPIDURAL INJECTION  2011   x 2  . MELANOMA EXCISION Left 2012   left arm, not sure about lymph nodes  . REPEAT CESAREAN SECTION     3 in all, last 42    Family  History  Problem Relation Age of Onset  . Lung cancer Father   . Heart disease Father   . Colon cancer Mother   . Colon cancer Maternal Uncle   . Colon cancer Maternal Uncle     Allergies  Allergen Reactions  . Dicyclomine Nausea Only and Other (See Comments)    Nausea, tingling in her whole body, couldn't sleep, made lights bright  . Nifedipine Other (See Comments)    Dropped blood pressure and heart rate too much  . Theophylline Other (See Comments)    Patient cannot recall reaction    Current Outpatient Medications on File Prior to Visit  Medication Sig Dispense Refill  . albuterol (VENTOLIN HFA) 108 (90 Base) MCG/ACT inhaler INHALE 2 PUFFS EVERY SIX HOURS AS NEEDED FOR WHEEZING OR SHORTNESS OF BREATH 54 g 3  . amLODipine (NORVASC) 10 MG tablet Take 1 tablet (10 mg total) by mouth daily. 90 tablet 1  . Ascorbic Acid (VITAMIN C) 500 MG tablet daily. Take 2 tablet daily     . aspirin 81 MG EC  tablet Take 1 tablet (81 mg total) by mouth daily. Stop and restart 10/05/2017 30 tablet   . Calcium Carbonate-Vitamin D (CALCIUM-VITAMIN D) 500-200 MG-UNIT per tablet Take 1 tablet by mouth 2 (two) times daily with a meal.      . cetirizine (ZYRTEC) 10 MG tablet Take 10 mg by mouth daily.    . Cranberry 1000 MG CAPS Take by mouth. Take 2 tablets daily     . fish oil-omega-3 fatty acids 1000 MG capsule Take 2 g by mouth 2 (two) times daily.      . fluticasone (FLONASE) 50 MCG/ACT nasal spray USE 2 SPRAYS IN EACH NOSTRIL EVERY DAY 48 g 6  . metoprolol succinate (TOPROL-XL) 50 MG 24 hr tablet TAKE 1 TABLET TWICE DAILY WITH OR IMMEDIATELY FOLLOWING MEALS 180 tablet 0  . Multiple Vitamin (MULTIVITAMIN) tablet Take 1 tablet by mouth daily.      . pantoprazole (PROTONIX) 40 MG tablet Take 1 tablet (40 mg total) by mouth daily before breakfast. 90 tablet 3  . Peppermint Oil (IBGARD) 90 MG CPCR Take 2 capsules by mouth 2 (two) times daily.    . quinapril (ACCUPRIL) 40 MG tablet TAKE 1 TABLET TWICE DAILY 180 tablet 1  . spironolactone (ALDACTONE) 25 MG tablet TAKE 1 TABLET EVERY DAY 90 tablet 0  . SYNTHROID 137 MCG tablet Take 1 tablet (137 mcg total) by mouth daily before breakfast. 60 tablet 0  . vitamin B-12 (CYANOCOBALAMIN) 500 MCG tablet Take 500 mcg by mouth daily.       No current facility-administered medications on file prior to visit.     BP (!) 150/80   Temp 98.1 F (36.7 C)   Wt 179 lb (81.2 kg)   BMI 32.74 kg/m       Objective:   Physical Exam Vitals signs and nursing note reviewed.  Constitutional:      Appearance: Normal appearance.  Abdominal:     General: Abdomen is flat. Bowel sounds are normal. There is no distension.     Palpations: Abdomen is soft. There is no mass.     Tenderness: There is no abdominal tenderness. There is no right CVA tenderness, left CVA tenderness, guarding or rebound.     Hernia: No hernia is present.  Musculoskeletal: Normal range of motion.    Skin:    General: Skin is warm and dry.  Neurological:     General: No  focal deficit present.     Mental Status: She is alert and oriented to person, place, and time.  Psychiatric:        Mood and Affect: Mood normal.        Behavior: Behavior normal.        Thought Content: Thought content normal.        Judgment: Judgment normal.       Assessment & Plan:  1. Dysuria - Possible bladder irritation. Will have her d/c caffeine over the weekend and take AZO. U/A negative, but do to symptoms will send for culture - POCT Urinalysis Dipstick (Automated) - Urine Culture; Future   Dorothyann Peng, NP

## 2018-05-22 LAB — URINE CULTURE
MICRO NUMBER:: 134477
RESULT: NO GROWTH
SPECIMEN QUALITY: ADEQUATE

## 2018-05-27 ENCOUNTER — Other Ambulatory Visit: Payer: Self-pay | Admitting: Family Medicine

## 2018-05-27 MED ORDER — METOPROLOL SUCCINATE ER 50 MG PO TB24: ORAL_TABLET | ORAL | 1 refills | Status: DC

## 2018-05-27 NOTE — Telephone Encounter (Signed)
Sent to the pharmacy by e-scribe. 

## 2018-05-28 DIAGNOSIS — H2513 Age-related nuclear cataract, bilateral: Secondary | ICD-10-CM | POA: Diagnosis not present

## 2018-05-28 DIAGNOSIS — H43813 Vitreous degeneration, bilateral: Secondary | ICD-10-CM | POA: Diagnosis not present

## 2018-06-01 ENCOUNTER — Other Ambulatory Visit: Payer: Self-pay | Admitting: Adult Health

## 2018-06-01 MED ORDER — SPIRONOLACTONE 25 MG PO TABS: 25.0000 mg | ORAL_TABLET | Freq: Every day | ORAL | 0 refills | Status: DC

## 2018-06-01 MED ORDER — QUINAPRIL HCL 40 MG PO TABS: 40.0000 mg | ORAL_TABLET | Freq: Two times a day (BID) | ORAL | 1 refills | Status: DC

## 2018-06-01 NOTE — Telephone Encounter (Signed)
Requested Prescriptions  Pending Prescriptions Disp Refills  . spironolactone (ALDACTONE) 25 MG tablet 90 tablet 0    Sig: Take 1 tablet (25 mg total) by mouth daily.     Cardiovascular: Diuretics - Aldosterone Antagonist Failed - 06/01/2018 12:52 PM      Failed - Last BP in normal range    BP Readings from Last 1 Encounters:  05/21/18 (!) 150/80         Passed - Cr in normal range and within 360 days    Creatinine, Ser  Date Value Ref Range Status  05/06/2018 1.16 0.40 - 1.20 mg/dL Final         Passed - K in normal range and within 360 days    Potassium  Date Value Ref Range Status  05/06/2018 4.1 3.5 - 5.1 mEq/L Final         Passed - Na in normal range and within 360 days    Sodium  Date Value Ref Range Status  05/06/2018 141 135 - 145 mEq/L Final         Passed - Valid encounter within last 6 months    Recent Outpatient Visits          1 week ago [place] at [facility], [place], NP   3 weeks ago Essential hypertension   Therapist, music at [facility], [place], NP   2 months ago Essential hypertension   Therapist, music at [facility], [place], NP   3 months ago Screening for heart disease   Therapist, music at [place], MD   8 months ago Essential hypertension   Therapist, music at [facility], Doretha Sou, MD      Future Appointments            In 5 months Nafziger, Tommi Rumps, NP [facility] at [place], [place]         . quinapril (ACCUPRIL) 40 MG tablet 180 tablet 1    Sig: Take 1 tablet (40 mg total) by mouth 2 (two) times daily.     Cardiovascular:  ACE Inhibitors Failed - 06/01/2018 12:52 PM      Failed - Last BP in normal range    BP Readings from Last 1 Encounters:  05/21/18 (!) 150/80         Passed - Cr in normal range and within 180 days    Creatinine, Ser  Date Value Ref Range Status  05/06/2018 1.16 0.40 - 1.20 mg/dL Final         Passed - K in normal range and  within 180 days    Potassium  Date Value Ref Range Status  05/06/2018 4.1 3.5 - 5.1 mEq/L Final         Passed - Patient is not pregnant      Passed - Valid encounter within last 6 months    Recent Outpatient Visits          1 week ago [place] [facility], [place], NP   3 weeks ago Essential hypertension   Therapist, music at [facility], [place], NP   2 months ago Essential hypertension   Therapist, music at [facility], [place], NP   3 months ago Screening for heart disease   Therapist, music at [place], MD   8 months ago Essential hypertension   Therapist, music at [facility], Doretha Sou, MD      Future Appointments  In 5 months Nafziger, Tommi Rumps, NP [facility] at [place], [facility]

## 2018-06-01 NOTE — Telephone Encounter (Signed)
Copied from [place] [phone]. Topic: Quick Communication - Rx Refill/Question >> Jun 01, 2018 12:47 PM Mcneil, Ja-Kwan wrote: Medication: quinapril (ACCUPRIL) 40 MG tablet and spironolactone (ALDACTONE) 25 MG tablet  Has the patient contacted their pharmacy? yes   Preferred Pharmacy (with phone number or street name): [facility], [place] [phone] (Phone)  [phone] (Fax)  Agent: Please be advised that RX refills may take up to 3 business days. We ask that you follow-up with your pharmacy.

## 2018-06-08 ENCOUNTER — Other Ambulatory Visit: Payer: Self-pay | Admitting: Obstetrics & Gynecology

## 2018-06-08 DIAGNOSIS — Z1231 Encounter for screening mammogram for malignant neoplasm of breast: Secondary | ICD-10-CM

## 2018-06-09 ENCOUNTER — Other Ambulatory Visit (INDEPENDENT_AMBULATORY_CARE_PROVIDER_SITE_OTHER): Payer: Medicare HMO

## 2018-06-09 DIAGNOSIS — E039 Hypothyroidism, unspecified: Secondary | ICD-10-CM

## 2018-06-09 LAB — TSH: TSH: 0.07 u[IU]/mL — ABNORMAL LOW (ref 0.35–4.50)

## 2018-06-10 ENCOUNTER — Other Ambulatory Visit: Payer: Self-pay | Admitting: Family Medicine

## 2018-06-10 MED ORDER — SYNTHROID 112 MCG PO TABS: 112.0000 ug | ORAL_TABLET | Freq: Every day | ORAL | 1 refills | Status: DC

## 2018-06-10 MED ORDER — LEVOTHYROXINE SODIUM 112 MCG PO TABS: 112.0000 ug | ORAL_TABLET | Freq: Every day | ORAL | 1 refills | Status: DC

## 2018-06-24 ENCOUNTER — Ambulatory Visit: Payer: Medicare HMO | Admitting: Cardiovascular Disease

## 2018-06-24 ENCOUNTER — Encounter: Payer: Self-pay | Admitting: Cardiovascular Disease

## 2018-06-24 VITALS — BP 120/82 | HR 64 | Ht 62.0 in | Wt 179.8 lb

## 2018-06-24 DIAGNOSIS — I10 Essential (primary) hypertension: Secondary | ICD-10-CM | POA: Diagnosis not present

## 2018-06-24 DIAGNOSIS — I712 Thoracic aortic aneurysm, without rupture: Secondary | ICD-10-CM | POA: Diagnosis not present

## 2018-06-24 DIAGNOSIS — I351 Nonrheumatic aortic (valve) insufficiency: Secondary | ICD-10-CM

## 2018-06-24 DIAGNOSIS — I7121 Aneurysm of the ascending aorta, without rupture: Secondary | ICD-10-CM

## 2018-06-24 DIAGNOSIS — I453 Trifascicular block: Secondary | ICD-10-CM | POA: Diagnosis not present

## 2018-06-24 MED ORDER — AMLODIPINE BESYLATE 5 MG PO TABS: 5.0000 mg | ORAL_TABLET | Freq: Every day | ORAL | 1 refills | Status: DC

## 2018-06-24 NOTE — Patient Instructions (Signed)
Medication Instructions:  Decrease Amlodipine to 5 mg daily.  If you need a refill on your cardiac medications before your next appointment, please call your pharmacy.    Follow-Up: At [facility], you and your health needs are our priority.  As part of our continuing mission to provide you with exceptional heart care, we have created designated Provider Care Teams.  These Care Teams include your primary Cardiologist (physician) and Advanced Practice Providers (APPs -  Physician Assistants and Nurse Practitioners) who all work together to provide you with the care you need, when you need it. You will need a follow up appointment in 1 year.  Please call our office 2 months in advance to schedule this appointment.  You may see Sanda Klein, MD or one of the following Advanced Practice Providers on your designated Care Team: [place], [place] . Fabian Sharp, PA-C  Any Other Special Instructions Will Be Listed Below (If Applicable). Your physician has requested that you regularly monitor and record your blood pressure readings at home. Please use the same machine at the same time of day to check your readings and send them through MyChart in 2 weeks.

## 2018-06-24 NOTE — Progress Notes (Signed)
Cardiology office note:    Date:  06/24/2018   ID:  [name], [name] 07/05/1945, MRN [id number]  PCP:  Dorothyann Peng, NP  Cardiologist:  Sanda Klein, MD  Electrophysiologist:  None   Referring MD: Stevie Kern, MD  Chief Complaint  Patient presents with  . Hypertension     History of Present Illness:    Jasmine Wheeler is a 73 y.o. 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  Since then she has noticed that her heart rate is a little faster, typically in the 70s rather than the 50s.  She has occasional episodes of lightheadedness that seem to be associated with changes in position, suggestive of orthostatic hypotension.  At home her blood pressures typically in the 100/60- 120/75 range.  She has not recorded any systolic blood pressures over 120.  Labs performed in January showed borderline glycemic control with a hemoglobin A1c of 5.7% and mildly elevated LDL cholesterol of 128 with an otherwise normal lipid profile.  The patient specifically denies any chest pain at rest exertion, dyspnea at rest or with exertion, orthopnea, paroxysmal nocturnal dyspnea, syncope, palpitations, focal neurological deficits, intermittent claudication, lower extremity edema, unexplained weight gain, cough, hemoptysis or wheezing.  She has a history of dysphagia and has previously undergone esophagoplasty and is on chronic treatment with proton pump inhibitors.  Her lumbar spine problems have improved and she decided not to have surgery at this point.  Because of a heart murmur she  underwent an echocardiogram in 2016.  The echo showed normal left ventricular systolic function with an ejection fraction of 60-65%, left ventricular hypertrophy with mild diastolic dysfunction, a dilated ascending aorta of 44 mm with trivial aortic valve insufficiency.  A follow-up echo that we performed in 2019 shows no significant interval change. .  Past Medical History:  Diagnosis Date  . Adenomatous colon polyp   . Asthma   . Benign gastric polyp - hyperplastic 09/2017  . Chronic low back pain   . Diverticulosis 01/23/2010   left colon  . Heart murmur 2016  . Hypertension   . Hypothyroidism   . IBS (irritable bowel syndrome)     Past Surgical History:  Procedure Laterality Date  . ABDOMINAL HYSTERECTOMY    . COLONOSCOPY W/ BIOPSIES  multiple  . HEMICOLECTOMY  02/15/10   right, tubulovillous adenoma appendix, Dr. Georgette Dover  . HEMORRHOID BANDING  2014  . LUMBAR EPIDURAL INJECTION  2011   x 2  . MELANOMA EXCISION Left 2012   left arm, not sure about lymph nodes  . REPEAT CESAREAN SECTION     3 in all, last 1973    Current Medications: Current Meds  Medication Sig  . albuterol (VENTOLIN HFA) 108 (90 Base) MCG/ACT inhaler INHALE 2 PUFFS EVERY SIX HOURS AS NEEDED FOR WHEEZING OR SHORTNESS OF BREATH  . amLODipine (NORVASC) 5 MG tablet Take 1 tablet (5 mg total) by mouth daily.  . Ascorbic Acid (VITAMIN C) 500 MG tablet daily. Take 2 tablet daily   . aspirin 81 MG EC tablet Take 1 tablet (81 mg total) by mouth daily. Stop and restart 10/05/2017  . Calcium Carbonate-Vitamin D (CALCIUM-VITAMIN D) 500-200  MG-UNIT per tablet Take 1 tablet by mouth 2 (two) times daily with a meal.    . cetirizine (ZYRTEC) 10 MG tablet Take 10 mg by mouth daily.  . Cranberry 1000 MG CAPS Take by mouth. Take 2 tablets daily   . fish oil-omega-3 fatty acids 1000 MG capsule Take 2 g by mouth 2 (two) times daily.    . fluticasone (FLONASE) 50 MCG/ACT nasal spray USE 2 SPRAYS IN EACH NOSTRIL EVERY DAY  .  metoprolol succinate (TOPROL-XL) 50 MG 24 hr tablet TAKE 1 TABLET TWICE DAILY WITH OR IMMEDIATELY FOLLOWING MEALS  . Multiple Vitamin (MULTIVITAMIN) tablet Take 1 tablet by mouth daily.    . pantoprazole (PROTONIX) 40 MG tablet Take 1 tablet (40 mg total) by mouth daily before breakfast.  . Peppermint Oil (IBGARD) 90 MG CPCR Take 2 capsules by mouth 2 (two) times daily.  . quinapril (ACCUPRIL) 40 MG tablet Take 1 tablet (40 mg total) by mouth 2 (two) times daily.  Marland Kitchen spironolactone (ALDACTONE) 25 MG tablet Take 1 tablet (25 mg total) by mouth daily.  Marland Kitchen SYNTHROID 112 MCG tablet Take 1 tablet (112 mcg total) by mouth daily before breakfast.  . vitamin B-12 (CYANOCOBALAMIN) 500 MCG tablet Take 500 mcg by mouth daily.    . [DISCONTINUED] amLODipine (NORVASC) 10 MG tablet Take 1 tablet (10 mg total) by mouth daily.  . [DISCONTINUED] amLODipine (NORVASC) 5 MG tablet Take 1 tablet (5 mg total) by mouth daily.     Allergies:   Dicyclomine; Nifedipine; and Theophylline   Social History   Socioeconomic History  . Marital status: Married    Spouse name: Not on file  . Number of children: 3  . Years of education: Not on file  . Highest education level: Not on file  Occupational History  . 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 Financial resource strain: Not on file  . Food insecurity:    Worry: Not on file    Inability: Not on file  . Transportation needs:    Medical: Not on file    Non-medical: Not on file  Tobacco Use  . Smoking status: Never Smoker  . Smokeless tobacco: Never Used  Substance and Sexual Activity  . Alcohol use: No  . Drug use: No  . Sexual activity: Not on file  Lifestyle  . Physical activity:    Days per week: Not on file    Minutes per session: Not on file  . Stress: Not on file  Relationships  . Social connections:    Talks on phone: Not on file    Gets together: Not on file    Attends religious service: Not on file    Active  member of club or organization: Not on file    Attends meetings of clubs or organizations: Not on file    Relationship status: Not on file  Other Topics Concern  . Not on file  Social History Narrative   Married retired lives in [place]   No alcohol tobacco or drug use   Caffeine is 2 cups of coffee daily   3 children   Elderly mother lives with her and requires some care      Family History: The patient's family history includes Colon cancer in her maternal uncle, maternal uncle, and mother; Heart disease in her father; Lung cancer in her father.  ROS:   Please see the history of present illness.     All other  systems reviewed and are negative.  EKGs/Labs/Other Studies Reviewed:    The following studies were reviewed today:   EKG:  EKG is not ordered today.  Recent Labs: 05/06/2018: ALT 10; BUN 21; Creatinine, Ser 1.16; Hemoglobin 11.8; Platelets 215.0; Potassium 4.1; Sodium 141 06/09/2018: TSH 0.07  Recent Lipid Panel    Component Value Date/Time   CHOL 202 (H) 05/06/2018 0954   TRIG 147.0 05/06/2018 0954   TRIG 59 02/12/2006 1105   HDL 43.90 05/06/2018 0954   CHOLHDL 5 05/06/2018 0954   VLDL 29.4 05/06/2018 0954   LDLCALC 128 (H) 05/06/2018 0954    Physical Exam:    VS:  BP 120/82   Pulse 64   Ht 5\' 2"  (1.575 m)   Wt 179 lb 12.8 oz (81.6 kg)   SpO2 97%   BMI 32.89 kg/m     Wt Readings from Last 3 Encounters:  06/24/18 179 lb 12.8 oz (81.6 kg)  05/21/18 179 lb (81.2 kg)  05/06/18 176 lb (79.8 kg)      General: Alert, oriented x3, no distress, mildly obese Head: no evidence of trauma, PERRL, EOMI, no exophtalmos or lid lag, no myxedema, no xanthelasma; normal ears, nose and oropharynx Neck: normal jugular venous pulsations and no hepatojugular reflux; brisk carotid pulses without delay and no carotid bruits Chest: clear to auscultation, no signs of consolidation by percussion or palpation, normal fremitus, symmetrical and full respiratory  excursions Cardiovascular: normal position and quality of the apical impulse, regular rhythm, normal first and widely split second heart sounds, distinct 2-3/6 decrescendo diastolic murmur heard all along the right sternal border, rubs or gallops Abdomen: no tenderness or distention, no masses by palpation, no abnormal pulsatility or arterial bruits, normal bowel sounds, no hepatosplenomegaly Extremities: no clubbing, cyanosis or edema; 2+ radial, ulnar and brachial pulses bilaterally; 2+ right femoral, posterior tibial and dorsalis pedis pulses; 2+ left femoral, posterior tibial and dorsalis pedis pulses; no subclavian or femoral bruits Neurological: grossly nonfocal Psych: Normal mood and affect   ASSESSMENT:    1. Nonrheumatic aortic valve insufficiency   2. Ascending aortic aneurysm ([place])   3. Trifascicular block   4. Essential hypertension    PLAN:    In order of problems listed above:  1. AI: As before, her murmur sounds much more impressive than the findings on echocardiogram.  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 2. AscAoAneur: Her aneurysm is relatively mild and there was no sign of progression between her 2 echocardiograms, but at some point it would be advisable to obtain CT angiography for a best evaluation of the thoracic aorta.  Control of her blood pressure remains critically important. 3. 1st deg AVB, RBBB+LAFB/LPFB: Avoid combination beta-blocker and centrally acting calcium channel blocker, monitor for symptoms of advanced heart block (asked her to promptly report extreme fatigue, syncope or dizziness that is not related to changes in position). She clearly has some intrinsic intraventricular conduction disease, but does not require pacemaker at this time.  4. HTN: It is critical to maintain normal blood pressure to avoid enlargement of her aortic aneurysm and worsening of aortic insufficiency and left ventricular hypertrophy.  Having said that, she has some symptoms of  hypotension and I recommended reducing the amlodipine to only 5 mg daily.   Medication Adjustments/Labs and Tests Ordered: Current medicines are reviewed at length with the patient today.  Concerns regarding medicines are outlined above.  No orders of the defined types were placed in this encounter.  Meds ordered this  encounter  Medications                 . amLODipine (NORVASC) 5 MG tablet    Sig: Take 1 tablet (5 mg total) by mouth daily.    Dispense:  90 tablet    Refill:  1    Patient Instructions  Medication Instructions:  Decrease Amlodipine to 5 mg daily.  If you need a refill on your cardiac medications before your next appointment, please call your pharmacy.    Follow-Up: At [facility], you and your health needs are our priority.  As part of our continuing mission to provide you with exceptional heart care, we have created designated Provider Care Teams.  These Care Teams include your primary Cardiologist (physician) and Advanced Practice Providers (APPs -  Physician Assistants and Nurse Practitioners) who all work together to provide you with the care you need, when you need it. You will need a follow up appointment in 1 year.  Please call our office 2 months in advance to schedule this appointment.  You may see Sanda Klein, MD or one of the following Advanced Practice Providers on your designated Care Team: [place], [place] . Fabian Sharp, PA-C  Any Other Special Instructions Will Be Listed Below (If Applicable). Your physician has requested that you regularly monitor and record your blood pressure readings at home. Please use the same machine at the same time of day to check your readings and send them through MyChart in 2 weeks.        Signed, Sanda Klein, MD  06/24/2018 6:08 PM    [place]

## 2018-07-08 ENCOUNTER — Other Ambulatory Visit: Payer: Self-pay | Admitting: Adult Health

## 2018-07-08 ENCOUNTER — Encounter: Payer: Self-pay | Admitting: Adult Health

## 2018-07-08 ENCOUNTER — Other Ambulatory Visit: Payer: Self-pay

## 2018-07-08 ENCOUNTER — Other Ambulatory Visit (INDEPENDENT_AMBULATORY_CARE_PROVIDER_SITE_OTHER): Payer: Medicare HMO

## 2018-07-08 DIAGNOSIS — E039 Hypothyroidism, unspecified: Secondary | ICD-10-CM

## 2018-07-08 LAB — TSH: TSH: 0.36 u[IU]/mL (ref 0.35–4.50)

## 2018-07-08 MED ORDER — SYNTHROID 112 MCG PO TABS: 112.0000 ug | ORAL_TABLET | Freq: Every day | ORAL | 3 refills | Status: DC

## 2018-07-12 MED ORDER — SYNTHROID 112 MCG PO TABS: 112.0000 ug | ORAL_TABLET | Freq: Every day | ORAL | 3 refills | Status: DC

## 2018-07-13 DIAGNOSIS — I712 Thoracic aortic aneurysm, without rupture: Secondary | ICD-10-CM

## 2018-07-13 DIAGNOSIS — I7121 Aneurysm of the ascending aorta, without rupture: Secondary | ICD-10-CM

## 2018-07-21 ENCOUNTER — Other Ambulatory Visit: Payer: Self-pay | Admitting: Adult Health

## 2018-07-21 NOTE — Telephone Encounter (Signed)
Sent to the pharmacy by e-scribe. 

## 2018-07-23 ENCOUNTER — Ambulatory Visit: Payer: Medicare HMO

## 2018-08-28 ENCOUNTER — Other Ambulatory Visit: Payer: Self-pay | Admitting: Adult Health

## 2018-08-30 NOTE — Telephone Encounter (Signed)
Sent to the pharmacy by e-scribe. 

## 2018-09-08 ENCOUNTER — Ambulatory Visit
Admission: RE | Admit: 2018-09-08 | Discharge: 2018-09-08 | Disposition: A | Discharge status: Home / Self Care | Payer: Medicare HMO | Source: Ambulatory Visit | Attending: Obstetrics & Gynecology | Admitting: Obstetrics & Gynecology

## 2018-09-08 ENCOUNTER — Other Ambulatory Visit: Payer: Self-pay

## 2018-09-08 DIAGNOSIS — Z1231 Encounter for screening mammogram for malignant neoplasm of breast: Secondary | ICD-10-CM

## 2018-09-10 ENCOUNTER — Ambulatory Visit: Payer: Medicare HMO

## 2018-09-17 ENCOUNTER — Telehealth: Payer: Self-pay | Admitting: Cardiovascular Disease

## 2018-09-17 DIAGNOSIS — R55 Syncope and collapse: Secondary | ICD-10-CM

## 2018-09-17 NOTE — Telephone Encounter (Signed)
Returned call to patient she already spoke to [employer].

## 2018-09-17 NOTE — Telephone Encounter (Signed)
Spoke with pt. She report occasionally when she exert herself she has a feeling like she is going to pass out. She state she thought maybe her BP was low and took it today. Readings are as followed; 157/88 152/87 148/90 151/84  She also report HR has been running around 70/71. Pt report she is not currently have symptoms but just concerned. Advise pt that a message will be routed to MD for recommendations but if symptoms reoccur before we return her call, to report to ED or urgent care for further evaluations. Pt voiced understanding.

## 2018-09-17 NOTE — Telephone Encounter (Signed)
New message:   Patient c,alling concering a feeling like she might pass out. BP was high today higher than normal. 157/88, 152/87, 148/90, 151/84. Pulse running 70 or 71 call on cell.

## 2018-09-17 NOTE — Telephone Encounter (Signed)
Phone disconnected, call waas dropped

## 2018-09-20 NOTE — Telephone Encounter (Signed)
The blood pressure readings are rather on the high end.  The heart rates are normal.  I do not think there is any reason to change her medicines based on this information, but I would ask her to please check her blood pressure and heart rate immediately, while she is feeling dizzy.  Please send [place] a log through my chart I will call those numbers in. We will also set up for a 14-day event monitor, preferably 1 would like transmission. Re: near-syncope

## 2018-09-21 NOTE — Telephone Encounter (Signed)
Left a message for the patient to call back.  

## 2018-09-22 NOTE — Telephone Encounter (Signed)
° ° °  Please return call to patient °

## 2018-09-22 NOTE — Telephone Encounter (Signed)
Returned call to patient Dr.Croitou's recommendation given.She will take B/P and pulse when she is having dizziness and send readings in 1 week.Advised scheduler will call back with monitor appointment.

## 2018-09-23 ENCOUNTER — Telehealth: Payer: Self-pay | Admitting: *Deleted

## 2018-09-23 NOTE — Telephone Encounter (Signed)
  [employer] to mail 14 day Zio XT long term holter monitor to her home.  Instructions reviewed briefly as the are included in her monitor kit.

## 2018-09-27 ENCOUNTER — Ambulatory Visit (INDEPENDENT_AMBULATORY_CARE_PROVIDER_SITE_OTHER): Payer: Medicare HMO

## 2018-09-27 DIAGNOSIS — R55 Syncope and collapse: Secondary | ICD-10-CM

## 2018-10-18 ENCOUNTER — Other Ambulatory Visit: Payer: Self-pay | Admitting: Adult Health

## 2018-10-18 ENCOUNTER — Other Ambulatory Visit: Payer: Self-pay | Admitting: Internal Medicine

## 2018-10-18 ENCOUNTER — Other Ambulatory Visit: Payer: Self-pay | Admitting: Cardiovascular Disease

## 2018-10-18 DIAGNOSIS — R55 Syncope and collapse: Secondary | ICD-10-CM | POA: Diagnosis not present

## 2018-10-18 NOTE — Telephone Encounter (Signed)
Rx(s) sent to pharmacy electronically.  

## 2018-10-19 ENCOUNTER — Other Ambulatory Visit: Payer: Self-pay

## 2018-10-19 DIAGNOSIS — D2271 Melanocytic nevi of right lower limb, including hip: Secondary | ICD-10-CM | POA: Diagnosis not present

## 2018-10-19 DIAGNOSIS — Z85820 Personal history of malignant melanoma of skin: Secondary | ICD-10-CM | POA: Diagnosis not present

## 2018-10-19 DIAGNOSIS — L821 Other seborrheic keratosis: Secondary | ICD-10-CM | POA: Diagnosis not present

## 2018-10-19 DIAGNOSIS — D2272 Melanocytic nevi of left lower limb, including hip: Secondary | ICD-10-CM | POA: Diagnosis not present

## 2018-10-19 DIAGNOSIS — D2261 Melanocytic nevi of right upper limb, including shoulder: Secondary | ICD-10-CM | POA: Diagnosis not present

## 2018-10-19 DIAGNOSIS — D2262 Melanocytic nevi of left upper limb, including shoulder: Secondary | ICD-10-CM | POA: Diagnosis not present

## 2018-10-19 DIAGNOSIS — D225 Melanocytic nevi of trunk: Secondary | ICD-10-CM | POA: Diagnosis not present

## 2018-10-19 DIAGNOSIS — D1801 Hemangioma of skin and subcutaneous tissue: Secondary | ICD-10-CM | POA: Diagnosis not present

## 2018-10-19 DIAGNOSIS — L308 Other specified dermatitis: Secondary | ICD-10-CM | POA: Diagnosis not present

## 2018-11-04 ENCOUNTER — Encounter: Payer: Self-pay | Admitting: Adult Health

## 2018-11-04 ENCOUNTER — Other Ambulatory Visit: Payer: Self-pay

## 2018-11-04 ENCOUNTER — Ambulatory Visit (INDEPENDENT_AMBULATORY_CARE_PROVIDER_SITE_OTHER): Payer: Medicare HMO | Admitting: Adult Health

## 2018-11-04 DIAGNOSIS — R55 Syncope and collapse: Secondary | ICD-10-CM

## 2018-11-04 NOTE — Progress Notes (Signed)
Virtual Visit via Video Note  I connected with Jasmine Wheeler  on 11/04/18 at  9:00 AM EDT by a video enabled telemedicine application and verified that I am speaking with the correct person using two identifiers.  Location patient: home Location provider:work or home office Persons participating in the virtual visit: patient, provider  I discussed the limitations of evaluation and management by telemedicine and the availability of in person appointments. The patient expressed understanding and agreed to proceed.   HPI:  Very pleasant 73 year old female who is being evaluated today for the feeling of dizziness and lightheadedness.  Her symptoms have been present since the beginning of June.  She has spoke with her cardiologist who did a long-term cardiac monitor which showed    The dominant rhythm is sinus rhythm with first degree AV block.  There are occasional brief episodes of paroxysmal atrial tachycardia.  There is no atrial fibrillation or ventricular tachycardia.  Occasional PVCs are seen.  There is no serious bradycardia/pause.  Some of the patient triggered recordings coincide with atrial tachycardia or PACs, although the association is not consistent  There were no medication change after the results of the cardiac monitor were done.   Echo in November 2019 showed no new changes with an EF of 60 to 65%  She reports at home her blood pressures are usually in the 120s to 130s over 70s with a pulse rate of 50-64.  Unfortunately, she continues to have the feeling like she is going to "pass out".  This is been happening on a daily basis and happens multiple times a day for a few seconds at a time.  He has not had any syncopal episodes or falls.  Likes to work outside and around the house and reports that this is becoming an issue for her.  When these presyncopal spells happen she can momentarily get short of breath.  This soon resolves.   She drinks about 30 to 40 ounces a  day 2 cups of coffee in the morning.  Food intake.  She denies blood in stool  ROS: See pertinent positives and negatives per HPI.  Past Medical History:  Diagnosis Date  . Adenomatous colon polyp   . Asthma   . Benign gastric polyp - hyperplastic 09/2017  . Chronic low back pain   . Diverticulosis 01/23/2010   left colon  . Heart murmur 2016  . Hypertension   . Hypothyroidism   . IBS (irritable bowel syndrome)     Past Surgical History:  Procedure Laterality Date  . ABDOMINAL HYSTERECTOMY    . COLONOSCOPY W/ BIOPSIES  multiple  . HEMICOLECTOMY  02/15/10   right, tubulovillous adenoma appendix, Dr. Georgette Dover  . HEMORRHOID BANDING  2014  . LUMBAR EPIDURAL INJECTION  2011   x 2  . MELANOMA EXCISION Left 2012   left arm, not sure about lymph nodes  . REPEAT CESAREAN SECTION     3 in all, last 44    Family History  Problem Relation Age of Onset  . Lung cancer Father   . Heart disease Father   . Colon cancer Mother   . Colon cancer Maternal Uncle   . Colon cancer Maternal Uncle       Current Outpatient Medications:  .  albuterol (VENTOLIN HFA) 108 (90 Base) MCG/ACT inhaler, INHALE 2 PUFFS EVERY SIX HOURS AS NEEDED FOR WHEEZING OR SHORTNESS OF BREATH, Disp: 54 g, Rfl: 3 .  amLODipine (NORVASC) 5 MG tablet, Take 1 tablet (5 mg  total) by mouth daily., Disp: 90 tablet, Rfl: 2 .  Ascorbic Acid (VITAMIN C) 500 MG tablet, daily. Take 2 tablet daily , Disp: , Rfl:  .  aspirin 81 MG EC tablet, Take 1 tablet (81 mg total) by mouth daily. Stop and restart 10/05/2017, Disp: 30 tablet, Rfl:  .  Calcium Carbonate-Vitamin D (CALCIUM-VITAMIN D) 500-200 MG-UNIT per tablet, Take 1 tablet by mouth 2 (two) times daily with a meal.  , Disp: , Rfl:  .  cetirizine (ZYRTEC) 10 MG tablet, Take 10 mg by mouth daily., Disp: , Rfl:  .  Cranberry 1000 MG CAPS, Take by mouth. Take 2 tablets daily , Disp: , Rfl:  .  fish oil-omega-3 fatty acids 1000 MG capsule, Take 2 g by mouth 2 (two) times daily.  ,  Disp: , Rfl:  .  fluticasone (FLONASE) 50 MCG/ACT nasal spray, USE 2 SPRAYS IN EACH NOSTRIL EVERY DAY, Disp: 48 g, Rfl: 6 .  metoprolol succinate (TOPROL-XL) 50 MG 24 hr tablet, TAKE 1 TABLET TWICE DAILY WITH OR IMMEDIATELY FOLLOWING MEALS, Disp: 180 tablet, Rfl: 1 .  Multiple Vitamin (MULTIVITAMIN) tablet, Take 1 tablet by mouth daily.  , Disp: , Rfl:  .  pantoprazole (PROTONIX) 40 MG tablet, TAKE 1 TABLET EVERY MORNING, Disp: 90 tablet, Rfl: 3 .  Peppermint Oil (IBGARD) 90 MG CPCR, Take 2 capsules by mouth 2 (two) times daily., Disp: , Rfl:  .  quinapril (ACCUPRIL) 40 MG tablet, TAKE 1 TABLET TWICE DAILY, Disp: 180 tablet, Rfl: 1 .  spironolactone (ALDACTONE) 25 MG tablet, TAKE 1 TABLET EVERY DAY, Disp: 90 tablet, Rfl: 1 .  SYNTHROID 112 MCG tablet, Take 1 tablet (112 mcg total) by mouth daily before breakfast., Disp: 90 tablet, Rfl: 3 .  vitamin B-12 (CYANOCOBALAMIN) 500 MCG tablet, Take 500 mcg by mouth daily.  , Disp: , Rfl:   EXAM:  VITALS per patient if applicable:  GENERAL: alert, oriented, appears well and in no acute distress  HEENT: atraumatic, conjunttiva clear, no obvious abnormalities on inspection of external nose and ears  NECK: normal movements of the head and neck  LUNGS: on inspection no signs of respiratory distress, breathing rate appears normal, no obvious gross SOB, gasping or wheezing  CV: no obvious cyanosis  MS: moves all visible extremities without noticeable abnormality  PSYCH/NEURO: pleasant and cooperative, no obvious depression or anxiety, speech and thought processing grossly intact  ASSESSMENT AND PLAN:  Discussed the following assessment and plan:  1. Pre-syncope -We will check blood work including thyroid and iron panel.  I am going to have her double up on the amount of water she drinks per day and will reevaluate with her once blood work has resulted. - CBC with Differential/Platelet; Future - TSH; Future - Basic metabolic panel; Future - IBC  + Ferritin; Future     I discussed the assessment and treatment plan with the patient. The patient was provided an opportunity to ask questions and all were answered. The patient agreed with the plan and demonstrated an understanding of the instructions.   The patient was advised to call back or seek an in-person evaluation if the symptoms worsen or if the condition fails to improve as anticipated.   Dorothyann Peng, NP

## 2018-11-10 ENCOUNTER — Other Ambulatory Visit: Payer: Self-pay

## 2018-11-10 ENCOUNTER — Other Ambulatory Visit (INDEPENDENT_AMBULATORY_CARE_PROVIDER_SITE_OTHER): Payer: Medicare HMO

## 2018-11-10 DIAGNOSIS — R55 Syncope and collapse: Secondary | ICD-10-CM | POA: Diagnosis not present

## 2018-11-10 LAB — CBC WITH DIFFERENTIAL/PLATELET
Basophils Absolute: 0 10*3/uL (ref 0.0–0.1)
Basophils Relative: 0.6 % (ref 0.0–3.0)
Eosinophils Absolute: 0.1 10*3/uL (ref 0.0–0.7)
Eosinophils Relative: 3.4 % (ref 0.0–5.0)
HCT: 36.3 % (ref 36.0–46.0)
Hemoglobin: 12.1 g/dL (ref 12.0–15.0)
Lymphocytes Relative: 27.7 % (ref 12.0–46.0)
Lymphs Abs: 1.2 10*3/uL (ref 0.7–4.0)
MCHC: 33.3 g/dL (ref 30.0–36.0)
MCV: 88 fl (ref 78.0–100.0)
Monocytes Absolute: 0.4 10*3/uL (ref 0.1–1.0)
Monocytes Relative: 8.6 % (ref 3.0–12.0)
Neutro Abs: 2.5 10*3/uL (ref 1.4–7.7)
Neutrophils Relative %: 59.7 % (ref 43.0–77.0)
Platelets: 205 10*3/uL (ref 150.0–400.0)
RBC: 4.13 Mil/uL (ref 3.87–5.11)
RDW: 14.2 % (ref 11.5–15.5)
WBC: 4.2 10*3/uL (ref 4.0–10.5)

## 2018-11-10 LAB — IBC + FERRITIN
Ferritin: 43.4 ng/mL (ref 10.0–291.0)
Iron: 117 ug/dL (ref 42–145)
Saturation Ratios: 30.7 % (ref 20.0–50.0)
Transferrin: 272 mg/dL (ref 212.0–360.0)

## 2018-11-10 LAB — BASIC METABOLIC PANEL
BUN: 22 mg/dL (ref 6–23)
CO2: 32 mEq/L (ref 19–32)
Calcium: 9.4 mg/dL (ref 8.4–10.5)
Chloride: 102 mEq/L (ref 96–112)
Creatinine, Ser: 1.15 mg/dL (ref 0.40–1.20)
GFR: 45.94 mL/min — ABNORMAL LOW (ref >60.00)
Glucose, Bld: 82 mg/dL (ref 70–99)
Potassium: 4.5 mEq/L (ref 3.5–5.1)
Sodium: 140 mEq/L (ref 135–145)

## 2018-11-10 LAB — TSH: TSH: 1.74 u[IU]/mL (ref 0.35–4.50)

## 2018-11-11 ENCOUNTER — Encounter: Payer: Self-pay | Admitting: Adult Health

## 2018-11-11 MED ORDER — FLUTICASONE PROPIONATE 50 MCG/ACT NA SUSP: 2.0000 | Freq: Every day | NASAL | 1 refills | Status: DC

## 2018-12-06 NOTE — Telephone Encounter (Signed)
Call placed to the patient concerning her MyChart message. On Saturday, she was walking and began to feel short of breath, palpitations and shaky. When she got home, she took her blood pressure. On the left arm it was 160/93 with a heart rate of 78 and on the right arm it was 133/81 with a heart rate of 81. She stated that she has been having these feelings for several months now but only when she exerts herself.   This morning she stated that she felt better but had just woken up and was not moving around a lot yet.  This morning her blood pressure was (before her medication) She did not take her Metoprolol Succinate last night :  Left arm: 155/84 65 Right arm :170/94 62 (she took this twice due to an error reading)  Stays away from caffeine. She has tried to drink more water which has helped some but she is still having the feelings on exertion. She drinks 4-5 glasses of water a day.  She stated that her blood pressure typically runs around 102-725 systolic. Recent blood pressures are: 8/16-128/80 78 8/15-133//81  8/14-136/79 8/13-121/75  She takes: Amlodipine 5 mg in the morning Metoprolol Succinate 50 bid Spironolactone 25 mg once daily in the morning Quinapril 40 mg bid  She stated that she has not felt good since she started the Amlodipine and wonders if it is the Amlodipine. She has been on it before many years ago and was taken off of it due to the same feelings.

## 2018-12-06 NOTE — Progress Notes (Signed)
Cardiology Office Note:   Date:  12/07/2018  NAME:  Jasmine Wheeler    MRN: [id number] DOB:  Aug 04, 1945   PCP:  Dorothyann Peng, NP  Cardiologist:  Sanda Klein, MD  Electrophysiologist:  None   Referring MD: Dorothyann Peng, NP   Chief Complaint  Patient presents with  . Palpitations   History of Present Illness:   Jasmine Wheeler is a 73 y.o. female with a hx of hypertension, aortic insufficiency, ascending aortic aneurysm, first-degree AV block, atrial tachycardia, who is being seen today for dizziness, palpitations, shortness of breath.  She reports 3 to 4 days of worsening shortness of breath with exertion.  She reports that when she does certain heavy activities she becomes profoundly short of breath, with associated palpitations, that last 4 to 5 minutes and resolved with cessation of activity and deep breathing.  She states this is happened 3-5 times per week.  She was recently evaluated by Dr. Sallyanne Kuster here in clinic and he ordered a monitor that demonstrated brief ectopic atrial beats.  Her diary demonstrated that she had symptoms of shortness of breath and palpitations that did not coincide with these brief episodes of extra beats.  Her recent echocardiogram from 2019 shows an EF of 65%, with a stable a sending aortic aneurysm at 43 mm.  She reports no recent changes in medications other than stopping diltiazem due to her slow heart rate per Dr. Willaim Rayas.  She reports no syncopal events, chest pressure, lower extremity edema, heart failure symptoms.  Past Medical History: Past Medical History:  Diagnosis Date  . Adenomatous colon polyp   . Asthma   . Benign gastric polyp - hyperplastic 09/2017  . Chronic low back pain   . Diverticulosis 01/23/2010   left colon  . Heart murmur 2016  . Hypertension   . Hypothyroidism   . IBS (irritable bowel syndrome)     Past Surgical History: Past Surgical History:  Procedure Laterality Date  . ABDOMINAL HYSTERECTOMY    .  COLONOSCOPY W/ BIOPSIES  multiple  . HEMICOLECTOMY  02/15/10   right, tubulovillous adenoma appendix, Dr. Georgette Dover  . HEMORRHOID BANDING  2014  . LUMBAR EPIDURAL INJECTION  2011   x 2  . MELANOMA EXCISION Left 2012   left arm, not sure about lymph nodes  . REPEAT CESAREAN SECTION     3 in all, last 1973    Current Medications: Current Meds  Medication Sig  . albuterol (VENTOLIN HFA) 108 (90 Base) MCG/ACT inhaler INHALE 2 PUFFS EVERY SIX HOURS AS NEEDED FOR WHEEZING OR SHORTNESS OF BREATH  . amLODipine (NORVASC) 5 MG tablet Take 1 tablet (5 mg total) by mouth daily.  . Ascorbic Acid (VITAMIN C) 500 MG tablet daily. Take 2 tablet daily   . aspirin 81 MG EC tablet Take 1 tablet (81 mg total) by mouth daily. Stop and restart 10/05/2017  . Calcium Carbonate-Vitamin D (CALCIUM-VITAMIN D) 500-200 MG-UNIT per tablet Take 1 tablet by mouth 2 (two) times daily with a meal.    . cetirizine (ZYRTEC) 10 MG tablet Take 10 mg by mouth daily.  . Cranberry 1000 MG CAPS Take by mouth. Take 2 tablets daily   . fish oil-omega-3 fatty acids 1000 MG capsule Take 1 g by mouth every evening.   . fluticasone (FLONASE) 50 MCG/ACT nasal spray Place 2 sprays into both nostrils daily.  . metoprolol succinate (TOPROL-XL) 50 MG 24 hr tablet TAKE 1 TABLET TWICE DAILY WITH OR IMMEDIATELY FOLLOWING MEALS  .  Multiple Vitamin (MULTIVITAMIN) tablet Take 1 tablet by mouth daily.    . pantoprazole (PROTONIX) 40 MG tablet TAKE 1 TABLET EVERY MORNING  . Peppermint Oil (IBGARD) 90 MG CPCR Take 2 capsules by mouth 2 (two) times daily.  . quinapril (ACCUPRIL) 40 MG tablet TAKE 1 TABLET TWICE DAILY  . spironolactone (ALDACTONE) 25 MG tablet TAKE 1 TABLET EVERY DAY  . SYNTHROID 112 MCG tablet Take 1 tablet (112 mcg total) by mouth daily before breakfast.  . vitamin B-12 (CYANOCOBALAMIN) 500 MCG tablet Take 500 mcg by mouth daily.       Allergies:    Dicyclomine, Nifedipine, and Theophylline   Social History: Social History    Socioeconomic History  . Marital status: Married    Spouse name: Not on file  . Number of children: 3  . Years of education: Not on file  . Highest education level: Not on file  Occupational History  . 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 Financial resource strain: Not on file  . Food insecurity    Worry: Not on file    Inability: Not on file  . Transportation needs    Medical: Not on file    Non-medical: Not on file  Tobacco Use  . Smoking status: Never Smoker  . Smokeless tobacco: Never Used  Substance and Sexual Activity  . Alcohol use: No  . Drug use: No  . Sexual activity: Not on file  Lifestyle  . Physical activity    Days per week: Not on file    Minutes per session: Not on file  . Stress: Not on file  Relationships  . Social Herbalist on phone: Not on file    Gets together: Not on file    Attends religious service: Not on file    Active member of club or organization: Not on file    Attends meetings of clubs or organizations: Not on file    Relationship status: Not on file  Other Topics Concern  . Not on file  Social History Narrative   Married retired lives in [place]   No alcohol tobacco or drug use   Caffeine is 2 cups of coffee daily   3 children   Elderly mother lives with her and requires some care      Family History: The patient's family history includes Colon cancer in her maternal uncle, maternal uncle, and mother; Heart disease in her father; Lung cancer in her father.  ROS:   All other ROS reviewed and negative. Pertinent positives noted in the HPI.     EKGs/Labs/Other Studies Reviewed:   The following studies were personally reviewed by me today.  Recent echocardiogram EF 65% trivial aortic insufficiency, ascending aortic aneurysm 43 mm unchanged from prior.  A1c 5.7, TSH 1.74.  Recent ambulatory ECG monitoring has shown brief ectopic atrial beats, no sustained arrhythmias.  Symptoms of palpitation  shortness of breath did not coincide with any of these beats.  EKG:  EKG is ordered today.  The ekg ordered today demonstrates normal sinus rhythm, heart rate 60, first-degree AV block, incomplete right bundle branch block, left anterior fascicular block, normal QT interval, and was personally reviewed by me.   Recent Labs: 05/06/2018: ALT 10 11/10/2018: BUN 22; Creatinine, Ser 1.15; Hemoglobin 12.1; Platelets 205.0; Potassium 4.5; Sodium 140; TSH 1.74   Recent Lipid Panel    Component Value Date/Time   CHOL 202 (H) 05/06/2018 5859  TRIG 147.0 05/06/2018 0954   TRIG 59 02/12/2006 1105   HDL 43.90 05/06/2018 0954   CHOLHDL 5 05/06/2018 0954   VLDL 29.4 05/06/2018 0954   LDLCALC 128 (H) 05/06/2018 0954    Physical Exam:   VS:  BP 140/90   Pulse 60   Temp 98.1 F (36.7 C) (Temporal)   Ht 5\' 2"  (1.575 m)   Wt 179 lb 3.2 oz (81.3 kg)   BMI 32.78 kg/m    Wt Readings from Last 3 Encounters:  12/07/18 179 lb 3.2 oz (81.3 kg)  06/24/18 179 lb 12.8 oz (81.6 kg)  05/21/18 179 lb (81.2 kg)    General: Well nourished, well developed, in no acute distress Heart: Atraumatic, normal size  Eyes: PEERLA, EOMI  Neck: Supple, no JVD Endocrine: No thryomegaly Cardiac: Normal S1, S2; RRR; no murmurs, rubs, or gallops Lungs: Clear to auscultation bilaterally, no wheezing, rhonchi or rales  Abd: Soft, nontender, no hepatomegaly  Ext: No edema, pulses 2+ Musculoskeletal: No deformities, BUE and BLE strength normal and equal Skin: Warm and dry, no rashes   Neuro: Alert and oriented to person, place, time, and situation, CNII-XII grossly intact, no focal deficits  Psych: Normal mood and affect   ASSESSMENT:   NAME@ is a 73 y.o. female who presents for the following: 1. Palpitations   2. SOB (shortness of breath) on exertion   3. Ascending aortic aneurysm (HCC)     PLAN:   1. Palpitations 2. SOB (shortness of breath) on exertion 3. Ascending aortic aneurysm ([place]) -She reports worsening  palpitations and shortness of breath with exertion over the past several days, including likely the past few months.  Her most recent echocardiogram was unchanged demonstrating normal EF trivial aortic insufficiency, and a stable ascending aortic aneurysm for 3 mm.  ECG today is unchanged as well which demonstrates prescribed block, incomplete right bundle branch block, left anterior fascicular block.  I reviewed her most recent ambulatory ECG which demonstrates no sustained arrhythmias to explain her symptoms or evidence of high-grade conduction disease to merit pacemaker plantation. -I discussed that she has not had a cardiac stress test in a number of years and we can pursue a [place] nuclear medicine perfusion study to exclude the possibility of underlying coronary disease.  The coronary CTA is also possibility however due to CKD stage III I will not give her a large contrast load to achieve the study.  A nuclear medicine stress test will be pursued instead. -She will continue her current medications per her primary cardiologist, and follow-up with him or me in 1 to 2 months after her stress test.  Disposition: Return in about 1 month (around 01/07/2019).  Medication Adjustments/Labs and Tests Ordered: Current medicines are reviewed at length with the patient today.  Concerns regarding medicines are outlined above.  Orders Placed This Encounter  Procedures  . MYOCARDIAL PERFUSION IMAGING  . EKG 12-Lead   No orders of the defined types were placed in this encounter.   Patient Instructions  Medication Instructions:  The current medical regimen is effective;  continue present plan and medications.  If you need a refill on your cardiac medications before your next appointment, please call your pharmacy.   Testing/Procedures: Your physician has requested that you have an [place]. A cardiac stress test is a cardiological test that measures the heart's ability to respond to external stress  in a controlled clinical environment. The stress response is induced by (LEXI-treadmill). For further information please visit [facility]ugeFiesta[facility]tn.  If you have questions or concerns about your appointment, you can call the Nuclear Lab at [phone].      Follow-Up: At [facility], you and your health needs are our priority.  As part of our continuing mission to provide you with exceptional heart care, we have created designated Provider Care Teams.  These Care Teams include your primary Cardiologist (physician) and Advanced Practice Providers (APPs -  Physician Assistants and Nurse Practitioners) who all work together to provide you with the care you need, when you need it. You will need a follow up appointment in 1 months.  You may see Sanda Klein, MD or Dr.O'Neal or one of the following Advanced Practice Providers on your designated Care Team: [place], [place] . Fabian Sharp, PA-C       Signed, Addison Naegeli. Audie Box, [place]  [address] [place], [place] [address] [phone]  12/07/2018 11:34 AM

## 2018-12-07 ENCOUNTER — Encounter: Payer: Self-pay | Admitting: Cardiovascular Disease

## 2018-12-07 ENCOUNTER — Other Ambulatory Visit: Payer: Self-pay

## 2018-12-07 ENCOUNTER — Ambulatory Visit (INDEPENDENT_AMBULATORY_CARE_PROVIDER_SITE_OTHER): Payer: Medicare HMO | Admitting: Cardiovascular Disease

## 2018-12-07 VITALS — BP 140/90 | HR 60 | Temp 98.1°F | Ht 62.0 in | Wt 179.2 lb

## 2018-12-07 DIAGNOSIS — R002 Palpitations: Secondary | ICD-10-CM

## 2018-12-07 DIAGNOSIS — I7121 Aneurysm of the ascending aorta, without rupture: Secondary | ICD-10-CM

## 2018-12-07 DIAGNOSIS — R0602 Shortness of breath: Secondary | ICD-10-CM | POA: Diagnosis not present

## 2018-12-07 DIAGNOSIS — I712 Thoracic aortic aneurysm, without rupture: Secondary | ICD-10-CM | POA: Diagnosis not present

## 2018-12-07 NOTE — Patient Instructions (Addendum)
Medication Instructions:  The current medical regimen is effective;  continue present plan and medications.  If you need a refill on your cardiac medications before your next appointment, please call your pharmacy.   Testing/Procedures: Your physician has requested that you have an [place]. A cardiac stress test is a cardiological test that measures the heart's ability to respond to external stress in a controlled clinical environment. The stress response is induced by (LEXI-treadmill). For further information please visit [facility]ugeFiesta[facility]tn. If you have questions or concerns about your appointment, you can call the Nuclear Lab at [phone].      Follow-Up: At [facility], you and your health needs are our priority.  As part of our continuing mission to provide you with exceptional heart care, we have created designated Provider Care Teams.  These Care Teams include your primary Cardiologist (physician) and Advanced Practice Providers (APPs -  Physician Assistants and Nurse Practitioners) who all work together to provide you with the care you need, when you need it. You will need a follow up appointment in 1 months.  You may see Sanda Klein, MD or Dr.O'Neal or one of the following Advanced Practice Providers on your designated Care Team: [place], [place] . Fabian Sharp, PA-C

## 2018-12-08 NOTE — Telephone Encounter (Signed)
Thanks for seeing her, Wes. Jasmine Wheeler

## 2018-12-14 ENCOUNTER — Telehealth (HOSPITAL_COMMUNITY): Payer: Self-pay

## 2018-12-14 NOTE — Telephone Encounter (Signed)
Encounter complete. 

## 2018-12-17 ENCOUNTER — Ambulatory Visit (HOSPITAL_COMMUNITY)
Admission: RE | Admit: 2018-12-17 | Discharge: 2018-12-17 | Disposition: A | Discharge status: Home / Self Care | Payer: Medicare HMO | Source: Ambulatory Visit | Attending: Cardiovascular Disease | Admitting: Cardiovascular Disease

## 2018-12-17 ENCOUNTER — Other Ambulatory Visit: Payer: Self-pay

## 2018-12-17 DIAGNOSIS — R002 Palpitations: Secondary | ICD-10-CM

## 2018-12-17 DIAGNOSIS — I712 Thoracic aortic aneurysm, without rupture: Secondary | ICD-10-CM | POA: Diagnosis not present

## 2018-12-17 DIAGNOSIS — R0602 Shortness of breath: Secondary | ICD-10-CM | POA: Insufficient documentation

## 2018-12-17 LAB — MYOCARDIAL PERFUSION IMAGING
LV dias vol: 88 mL (ref 46–106)
LV sys vol: 32 mL
Peak HR: 100 {beats}/min
Rest HR: 64 {beats}/min
SDS: 1
SRS: 0
SSS: 1
TID: 0.82

## 2018-12-17 MED ORDER — TECHNETIUM TC 99M TETROFOSMIN IV KIT
31.6000 | PACK | Freq: Once | INTRAVENOUS | Status: AC | PRN
  Administered 2018-12-17: 31.6 via INTRAVENOUS
  Filled 2018-12-17: qty 32

## 2018-12-17 MED ORDER — REGADENOSON 0.4 MG/5ML IV SOLN
0.4000 mg | Freq: Once | INTRAVENOUS | Status: AC
  Administered 2018-12-17: 0.4 mg via INTRAVENOUS

## 2018-12-17 MED ORDER — TECHNETIUM TC 99M TETROFOSMIN IV KIT
9.6000 | PACK | Freq: Once | INTRAVENOUS | Status: AC | PRN
  Administered 2018-12-17: 9.6 via INTRAVENOUS
  Filled 2018-12-17: qty 10

## 2018-12-21 ENCOUNTER — Encounter: Payer: Self-pay | Admitting: Adult Health

## 2018-12-22 ENCOUNTER — Other Ambulatory Visit: Payer: Self-pay | Admitting: *Deleted

## 2018-12-22 DIAGNOSIS — I10 Essential (primary) hypertension: Secondary | ICD-10-CM

## 2018-12-22 MED ORDER — HYDROCHLOROTHIAZIDE 12.5 MG PO CAPS: 12.5000 mg | ORAL_CAPSULE | Freq: Every day | ORAL | 5 refills | Status: DC

## 2018-12-30 ENCOUNTER — Ambulatory Visit (INDEPENDENT_AMBULATORY_CARE_PROVIDER_SITE_OTHER): Payer: Medicare HMO

## 2018-12-30 ENCOUNTER — Other Ambulatory Visit: Payer: Self-pay

## 2018-12-30 DIAGNOSIS — Z23 Encounter for immunization: Secondary | ICD-10-CM

## 2019-01-18 ENCOUNTER — Other Ambulatory Visit: Payer: Self-pay

## 2019-01-18 ENCOUNTER — Encounter: Payer: Self-pay | Admitting: Physician Assistant

## 2019-01-18 ENCOUNTER — Ambulatory Visit (INDEPENDENT_AMBULATORY_CARE_PROVIDER_SITE_OTHER): Payer: Medicare HMO | Admitting: Physician Assistant

## 2019-01-18 VITALS — BP 171/84 | HR 65 | Temp 97.0°F | Ht 62.0 in | Wt 178.4 lb

## 2019-01-18 DIAGNOSIS — R0600 Dyspnea, unspecified: Secondary | ICD-10-CM | POA: Diagnosis not present

## 2019-01-18 DIAGNOSIS — I712 Thoracic aortic aneurysm, without rupture: Secondary | ICD-10-CM | POA: Diagnosis not present

## 2019-01-18 DIAGNOSIS — I10 Essential (primary) hypertension: Secondary | ICD-10-CM | POA: Diagnosis not present

## 2019-01-18 DIAGNOSIS — I7121 Aneurysm of the ascending aorta, without rupture: Secondary | ICD-10-CM

## 2019-01-18 DIAGNOSIS — I440 Atrioventricular block, first degree: Secondary | ICD-10-CM

## 2019-01-18 NOTE — Patient Instructions (Addendum)
Medication Instructions:   INCREASE Metoprolol Succinate (Toprol) to 75 mg 2 times a day  If you need a refill on your cardiac medications before your next appointment, please call your pharmacy.   Lab work:  You will need to have labs (Blood work) drawn today that were previously ordered by Dr. Sallyanne Kuster:  BMET If you have labs (blood work) drawn today and your tests are completely normal, you will receive your results only by: Marland Kitchen MyChart Message (if you have MyChart) OR . A paper copy in the mail If you have any lab test that is abnormal or we need to change your treatment, we will call you to review the results.  Testing/Procedures: NONE ordered at this time of appointment   Follow-Up: At [facility], you and your health needs are our priority.  As part of our continuing mission to provide you with exceptional heart care, we have created designated Provider Care Teams.  These Care Teams include your primary Cardiologist (physician) and Advanced Practice Providers (APPs -  Physician Assistants and Nurse Practitioners) who all work together to provide you with the care you need, when you need it. . Your physician recommends that you schedule a follow-up appointment in 2 weeks with Almyra Deforest, PA-C  Any Other Special Instructions Will Be Listed Below (If Applicable).   Keep a blood pressure diary with 2 blood pressure readings a day  Take your morning blood pressure 2 hours after your morning medications  Take your nightly blood pressure at a set time every night

## 2019-01-18 NOTE — Progress Notes (Signed)
Cardiology Office Note    Date:  01/19/2019   ID:  Jasmine Wheeler, Jasmine Wheeler [birth year], MRN [id number]  PCP:  Dorothyann Peng, NP  Cardiologist:  Dr. Sallyanne Kuster  Chief Complaint  Patient presents with   Follow-up    seen for Dr. Sallyanne Kuster    History of Present Illness:  Jasmine Wheeler is a 73 y.o. female with PMH of HTN, aortic insufficiency, ascending aortic aneurysm, atrial tachycardia and a first-degree AV block.  Recent heart monitor showed brief ectopic atrial beats however no significant arrhythmia.  Her daily diary demonstrated that her symptom of shortness of breath and palpitation does not coincide with the brief episode of extra beats.  Echocardiogram in 2019 showed EF 65%, stable ascending aortic aneurysm measuring at 43 mm.  Patient was recently seen by Dr. Audie Box for worsening shortness of breath with exertion, palpitation and dizziness.  Myoview was repeated on 12/09/2018 which was normal, EF 64%.  Based on the recent patient messages, she was concerned that amlodipine might be causing her issue.  Amlodipine was replaced by low-dose hydrochlorothiazide.  Patient presents today for cardiology office visit.  She continues to have intermittent palpitation.  She says since the Myoview, she has had at least 2 episodes where she felt like her heart was fluttering up to 5 minutes.  She still occasionally have some shortness of breath during physical exertion, however this has improved after discontinued amlodipine.  Her blood pressure is very high today, she says usually her blood pressures in the 140s recently.  Ideally with ascending thoracic aortic aneurysm, her systolic blood pressure should be around 120s.  To help with her blood pressure and palpitation, I decided to increase her metoprolol to 75 mg twice daily.  Note, her previous diltiazem was discontinued in November 2019 due to slow heart rate, however at the time she was on 360 mg full dose diltiazem.  Increasing the metoprolol  likely would not reduce her heart rate too much.  Since she does have a first-degree heart block, I plan to reassess her in 2 weeks.  She will need a basic metabolic panel today after addition of hydrochlorothiazide recently.   Past Medical History:  Diagnosis Date   Adenomatous colon polyp    Asthma    Benign gastric polyp - hyperplastic 09/2017   Chronic low back pain    Diverticulosis 01/23/2010   left colon   Heart murmur 2016   Hypertension    Hypothyroidism    IBS (irritable bowel syndrome)     Past Surgical History:  Procedure Laterality Date   ABDOMINAL HYSTERECTOMY     COLONOSCOPY W/ BIOPSIES  multiple   HEMICOLECTOMY  02/15/10   right, tubulovillous adenoma appendix, Dr. Georgette Dover   HEMORRHOID BANDING  2014   [place]  2011   x 2   MELANOMA EXCISION Left 2012   left arm, not sure about lymph nodes   REPEAT CESAREAN SECTION     3 in all, last 1973    Current Medications: Outpatient Medications Prior to Visit  Medication Sig Dispense Refill   albuterol (VENTOLIN HFA) 108 (90 Base) MCG/ACT inhaler INHALE 2 PUFFS EVERY SIX HOURS AS NEEDED FOR WHEEZING OR SHORTNESS OF BREATH 54 g 3   Ascorbic Acid (VITAMIN C) 500 MG tablet daily. Take 2 tablet daily      aspirin 81 MG EC tablet Take 1 tablet (81 mg total) by mouth daily. Stop and restart 10/05/2017 30 tablet    Calcium Carbonate-Vitamin D (  CALCIUM-VITAMIN D) 500-200 MG-UNIT per tablet Take 1 tablet by mouth 2 (two) times daily with a meal.       cetirizine (ZYRTEC) 10 MG tablet Take 10 mg by mouth daily.     Cranberry 1000 MG CAPS Take by mouth. Take 2 tablets daily      fish oil-omega-3 fatty acids 1000 MG capsule Take 1 g by mouth every evening.      fluticasone (FLONASE) 50 MCG/ACT nasal spray Place 2 sprays into both nostrils daily. 48 g 1   hydrochlorothiazide (MICROZIDE) 12.5 MG capsule Take 1 capsule (12.5 mg total) by mouth daily. 30 capsule 5   metoprolol succinate  (TOPROL-XL) 50 MG 24 hr tablet TAKE 1 TABLET TWICE DAILY WITH OR IMMEDIATELY FOLLOWING MEALS 180 tablet 1   Multiple Vitamin (MULTIVITAMIN) tablet Take 1 tablet by mouth daily.       pantoprazole (PROTONIX) 40 MG tablet TAKE 1 TABLET EVERY MORNING 90 tablet 3   Peppermint Oil (IBGARD) 90 MG CPCR Take 2 capsules by mouth 2 (two) times daily.     quinapril (ACCUPRIL) 40 MG tablet TAKE 1 TABLET TWICE DAILY 180 tablet 1   spironolactone (ALDACTONE) 25 MG tablet TAKE 1 TABLET EVERY DAY 90 tablet 1   SYNTHROID 112 MCG tablet Take 1 tablet (112 mcg total) by mouth daily before breakfast. 90 tablet 3   vitamin B-12 (CYANOCOBALAMIN) 500 MCG tablet Take 500 mcg by mouth daily.       No facility-administered medications prior to visit.      Allergies:   Dicyclomine, Nifedipine, Amlodipine, and Theophylline   Social History   Socioeconomic History   Marital status: Married    Spouse name: Not on file   Number of children: 3   Years of education: Not on file   Highest education level: Not on file  Occupational History   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    Employer: [employer] resource strain: Not on file   Food insecurity    Worry: Not on file    Inability: Not on file   Transportation needs    Medical: Not on file    Non-medical: Not on file  Tobacco Use   Smoking status: Never Smoker   Smokeless tobacco: Never Used  Substance and Sexual Activity   Alcohol use: No   Drug use: No   Sexual activity: Not on file  Lifestyle   Physical activity    Days per week: Not on file    Minutes per session: Not on file   Stress: Not on file  Relationships   Social connections    Talks on phone: Not on file    Gets together: Not on file    Attends religious service: Not on file    Active member of club or organization: Not on file    Attends meetings of clubs or organizations: Not on file    Relationship status: Not on file  Other Topics Concern    Not on file  Social History Narrative   Married retired lives in    No alcohol tobacco or drug use   Caffeine is 2 cups of coffee daily   3 children   Elderly mother lives with her and requires some care      Family History:  The patient's family history includes Colon cancer in her maternal uncle, maternal uncle, and mother; Heart disease in her father; Lung cancer in her father.   ROS:   Please see  the history of present illness.    ROS All other systems reviewed and are negative.   PHYSICAL EXAM:   VS:  BP (!) 171/84 (BP Location: Left Arm, Patient Position: Sitting, Cuff Size: Normal)    Pulse 65    Temp (!) 97 F (36.1 C)    Ht 5\' 2"  (1.575 m)    Wt 178 lb 6.4 oz (80.9 kg)    SpO2 96%    BMI 32.63 kg/m    GEN: Well nourished, well developed, in no acute distress  HEENT: normal  Neck: no JVD, carotid bruits, or masses Cardiac: RRR; no murmurs, rubs, or gallops,no edema  Respiratory:  clear to auscultation bilaterally, normal work of breathing GI: soft, nontender, nondistended, + BS MS: no deformity or atrophy  Skin: warm and dry, no rash Neuro:  Alert and Oriented x 3, Strength and sensation are intact Psych: euthymic mood, full affect  Wt Readings from Last 3 Encounters:  01/18/19 178 lb 6.4 oz (80.9 kg)  12/17/18 179 lb (81.2 kg)  12/07/18 179 lb 3.2 oz (81.3 kg)      Studies/Labs Reviewed:   EKG:  EKG is not ordered today.    Recent Labs: 05/06/2018: ALT 10 11/10/2018: Hemoglobin 12.1; Platelets 205.0; TSH 1.74 01/18/2019: BUN 27; Creatinine, Ser 1.52; Potassium 4.2; Sodium 141   Lipid Panel    Component Value Date/Time   CHOL 202 (H) 05/06/2018 0954   TRIG 147.0 05/06/2018 0954   TRIG 59 02/12/2006 1105   HDL 43.90 05/06/2018 0954   CHOLHDL 5 05/06/2018 0954   VLDL 29.4 05/06/2018 0954   LDLCALC 128 (H) 05/06/2018 0954    Additional studies/ records that were reviewed today include:   Myoview 12/17/2018 Study Highlights    Nuclear  stress EF: 64%.  The left ventricular ejection fraction is normal (55-65%).  There was no ST segment deviation noted during stress.  The study is normal.  This is a low risk study.   Normal stress nuclear study with no ischemia or infarction.  Gated ejection fraction 64% with normal wall motion.     ASSESSMENT:    1. Essential hypertension   2. Ascending aortic aneurysm ([place])   3. First degree AV block   4. Dyspnea, unspecified type      PLAN:  In order of problems listed above:  1. Hypertension: Blood pressure quite elevated today.  Will increase metoprolol to 75 mg twice daily.  Note patient previously has history of first-degree AV block, I plan to bring the patient back in 2 weeks for reassessment of blood pressure and a repeat EKG to make sure she does not have worsening conduction disease.  I was hesitant to increase hydrochlorothiazide since she is already on spironolactone and prone for dehydration.  We will repeat basic metabolic panel.   2. Dyspnea: Her amlodipine was recently discontinued and a switch to hydrochlorothiazide at Dr. Victorino December recommendation.  She previously felt amlodipine was responsible for her dyspnea, surprisingly, since discontinuation of the amlodipine, her dyspnea has improved.  3. Ascending aortic aneurysm: Recent echocardiogram showed stable ascending aortic aneurysm measuring at 43 mm.  Continue follow-up image on a yearly basis.  Once she reaches 45 mm, will recheck every 6 months    Medication Adjustments/Labs and Tests Ordered: Current medicines are reviewed at length with the patient today.  Concerns regarding medicines are outlined above.  Medication changes, Labs and Tests ordered today are listed in the Patient Instructions below. Patient Instructions  Medication Instructions:  INCREASE Metoprolol Succinate (Toprol) to 75 mg 2 times a day  If you need a refill on your cardiac medications before your next appointment, please call  your pharmacy.   Lab work:  You will need to have labs (Blood work) drawn today that were previously ordered by Dr. Sallyanne Kuster:  BMET If you have labs (blood work) drawn today and your tests are completely normal, you will receive your results only by:  [place] (if you have MyChart) OR  A paper copy in the mail If you have any lab test that is abnormal or we need to change your treatment, we will call you to review the results.  Testing/Procedures: NONE ordered at this time of appointment   Follow-Up: At [facility], you and your health needs are our priority.  As part of our continuing mission to provide you with exceptional heart care, we have created designated Provider Care Teams.  These Care Teams include your primary Cardiologist (physician) and Advanced Practice Providers (APPs -  Physician Assistants and Nurse Practitioners) who all work together to provide you with the care you need, when you need it.  Your physician recommends that you schedule a follow-up appointment in 2 weeks with Almyra Deforest, PA-C  Any Other Special Instructions Will Be Listed Below (If Applicable).   Keep a blood pressure diary with 2 blood pressure readings a day  Take your morning blood pressure 2 hours after your morning medications  Take your nightly blood pressure at a set time every night     Jasmine Wheeler, [place]  01/19/2019 3:00 PM    [facility] [place], [place], [place]  [address] Phone: [phone]; Fax: [phone]

## 2019-01-19 ENCOUNTER — Encounter: Payer: Self-pay | Admitting: Physician Assistant

## 2019-01-19 ENCOUNTER — Telehealth: Payer: Self-pay | Admitting: *Deleted

## 2019-01-19 DIAGNOSIS — I10 Essential (primary) hypertension: Secondary | ICD-10-CM

## 2019-01-19 LAB — BASIC METABOLIC PANEL
BUN/Creatinine Ratio: 18 (ref 12–28)
BUN: 27 mg/dL (ref 8–27)
CO2: 28 mmol/L (ref 20–29)
Calcium: 9.5 mg/dL (ref 8.7–10.3)
Chloride: 99 mmol/L (ref 96–106)
Creatinine, Ser: 1.52 mg/dL — ABNORMAL HIGH (ref 0.57–1.00)
GFR calc Af Amer: 38 mL/min/{1.73_m2} — ABNORMAL LOW (ref >59)
GFR calc non Af Amer: 33 mL/min/{1.73_m2} — ABNORMAL LOW (ref >59)
Glucose: 100 mg/dL — ABNORMAL HIGH (ref 65–99)
Potassium: 4.2 mmol/L (ref 3.5–5.2)
Sodium: 141 mmol/L (ref 134–144)

## 2019-01-19 NOTE — Telephone Encounter (Signed)
Patient made aware of results and verbalized understanding.  Repeat lab orders have been placed. 

## 2019-01-19 NOTE — Telephone Encounter (Signed)
-----   Message from Sanda Klein, MD sent at 01/19/2019  8:21 AM EDT ----- Kidney tests a little worse. Recently started diuretic. Please make sure she is drinking enough water every day and repeat the BMET in 2 weeks.

## 2019-01-25 NOTE — Progress Notes (Signed)
Thanks, Isaac Laud, I agree.

## 2019-02-02 ENCOUNTER — Other Ambulatory Visit: Payer: Self-pay | Admitting: Adult Health

## 2019-02-02 MED ORDER — ALBUTEROL SULFATE HFA 108 (90 BASE) MCG/ACT IN AERS: INHALATION_SPRAY | RESPIRATORY_TRACT | 3 refills | Status: DC

## 2019-02-02 NOTE — Telephone Encounter (Signed)
Medication refill: albuterol (VENTOLIN HFA) 108 (90 Base) MCG/ACT inhaler [id number]   Pharmacy:  [facility], [place] [phone] (Phone) [phone] (Fax)    Pt aware of turn around time.

## 2019-02-02 NOTE — Telephone Encounter (Signed)
Requested medication (s) are due for refill today: yes  Requested medication (s) are on the active medication list: yes  Last refill:  12/25/2017  Future visit scheduled: no   Notes to clinic: review for refill   Requested Prescriptions  Pending Prescriptions Disp Refills   albuterol (VENTOLIN HFA) 108 (90 Base) MCG/ACT inhaler 54 g 3    Sig: INHALE 2 PUFFS EVERY SIX HOURS AS NEEDED FOR WHEEZING OR SHORTNESS OF BREATH     Pulmonology:  [facility]ists Failed - 02/02/2019 11:32 AM      Failed - One inhaler should last at least one month. If the patient is requesting refills earlier, contact the patient to check for uncontrolled symptoms.      Passed - Valid encounter within last 12 months    Recent Outpatient Visits          3 months ago Pre-syncope   Therapist, music at [facility], [place], NP   8 months ago [place] [facility], [place], NP   9 months ago Essential hypertension   Therapist, music at [facility], [place], NP   10 months ago Essential hypertension   Therapist, music at [facility], [place], NP   11 months ago Screening for heart disease   Therapist, music at [place], MD      Future Appointments            In 5 days [place], [place], [place], [facility]

## 2019-02-07 ENCOUNTER — Encounter: Payer: Self-pay | Admitting: Physician Assistant

## 2019-02-07 ENCOUNTER — Ambulatory Visit (INDEPENDENT_AMBULATORY_CARE_PROVIDER_SITE_OTHER): Payer: Medicare HMO | Admitting: Physician Assistant

## 2019-02-07 ENCOUNTER — Other Ambulatory Visit: Payer: Self-pay

## 2019-02-07 VITALS — BP 188/94 | HR 57 | Temp 97.9°F | Ht 62.0 in | Wt 178.8 lb

## 2019-02-07 DIAGNOSIS — I712 Thoracic aortic aneurysm, without rupture, unspecified: Secondary | ICD-10-CM

## 2019-02-07 DIAGNOSIS — I471 Supraventricular tachycardia: Secondary | ICD-10-CM

## 2019-02-07 DIAGNOSIS — I10 Essential (primary) hypertension: Secondary | ICD-10-CM

## 2019-02-07 DIAGNOSIS — N179 Acute kidney failure, unspecified: Secondary | ICD-10-CM

## 2019-02-07 LAB — BASIC METABOLIC PANEL
BUN/Creatinine Ratio: 15 (ref 12–28)
BUN: 18 mg/dL (ref 8–27)
CO2: 29 mmol/L (ref 20–29)
Calcium: 9.5 mg/dL (ref 8.7–10.3)
Chloride: 97 mmol/L (ref 96–106)
Creatinine, Ser: 1.22 mg/dL — ABNORMAL HIGH (ref 0.57–1.00)
GFR calc Af Amer: 50 mL/min/{1.73_m2} — ABNORMAL LOW (ref >59)
GFR calc non Af Amer: 43 mL/min/{1.73_m2} — ABNORMAL LOW (ref >59)
Glucose: 82 mg/dL (ref 65–99)
Potassium: 4.2 mmol/L (ref 3.5–5.2)
Sodium: 140 mmol/L (ref 134–144)

## 2019-02-07 MED ORDER — HYDROCHLOROTHIAZIDE 12.5 MG PO CAPS: 12.5000 mg | ORAL_CAPSULE | Freq: Every day | ORAL | 3 refills | Status: DC

## 2019-02-07 NOTE — Patient Instructions (Signed)
Medication Instructions:   Your physician recommends that you continue on your current medications as directed. Please refer to the Current Medication list given to you today. *If you need a refill on your cardiac medications before your next appointment, please call your pharmacy*  Lab Work:  You will need to have labs (blood work) drawn today  BMET If you have labs (blood work) drawn today and your tests are completely normal, you will receive your results only by: [place] Kitchen MyChart Message (if you have MyChart) OR . A paper copy in the mail If you have any lab test that is abnormal or we need to change your treatment, we will call you to review the results.  Testing/Procedures: NONE ordered at this time of appointment    Follow-Up: At [facility], you and your health needs are our priority.  As part of our continuing mission to provide you with exceptional heart care, we have created designated Provider Care Teams.  These Care Teams include your primary Cardiologist (physician) and Advanced Practice Providers (APPs -  Physician Assistants and Nurse Practitioners) who all work together to provide you with the care you need, when you need it.  Your next appointment:   6 months  The format for your next appointment:   In Person  Provider:   Sanda Klein, MD  Other Instructions

## 2019-02-07 NOTE — Progress Notes (Signed)
Cardiology Office Note    Date:  02/08/2019   ID:  Saralynn, Torsiello 1945-05-16, MRN [id number]  PCP:  Dorothyann Peng, NP  Cardiologist:  Dr. Sallyanne Kuster   Chief Complaint  Patient presents with  . Follow-up    seen for Dr. Sallyanne Kuster, BP management    History of Present Illness:  BARBARELLA WIKER is a 73 y.o. female with PMH of HTN, aortic insufficiency, ascending aortic aneurysm, atrial tachycardia and first-degree AV block.  Recent heart monitor showed brief ectopic atrial beats however no significant arrhythmia.  Her daily diary demonstrated that her symptom of shortness of breath and palpitation does not coincide with the brief episode of extra beats.  Echocardiogram in 2019 showed EF 65%, stable ascending aortic aneurysm measuring at 43 mm.  Patient was recently seen by Dr. Audie Box for worsening shortness of breath with exertion, palpitation and dizziness.  Myoview was repeated on 12/09/2018 which was normal, EF 64%.  Based on the recent patient messages, she was concerned that amlodipine might be causing her issue.  Amlodipine was replaced by low-dose hydrochlorothiazide.  I last saw the patient on 01/18/2019, his blood pressure was very elevated at the time.  With her thoracic aortic aneurysm, I recommended lower her systolic blood pressure to around 120.  I increased her metoprolol to 75 mg twice daily.  Patient presents today for cardiology office visit.  She has been doing well.  Even though her blood pressure here in the office was in the 180s, I suspect there was a component of whitecoat syndrome.  Based on home blood pressure diary, her blood pressure has been in the 120s to 130s at home.  She denies any recent chest pain or significant shortness of breath.  Overall she has been feeling well.  She can follow-up in 6 months.  She will need a basic metabolic panel today as recent blood work showed some degree of dehydration on the hydrochlorothiazide.  She is also due for CTA of the  chest for her thoracic aortic aneurysm.    Past Medical History:  Diagnosis Date  . Adenomatous colon polyp   . Asthma   . Benign gastric polyp - hyperplastic 09/2017  . Chronic low back pain   . Diverticulosis 01/23/2010   left colon  . Heart murmur 2016  . Hypertension   . Hypothyroidism   . IBS (irritable bowel syndrome)     Past Surgical History:  Procedure Laterality Date  . ABDOMINAL HYSTERECTOMY    . COLONOSCOPY W/ BIOPSIES  multiple  . HEMICOLECTOMY  02/15/10   right, tubulovillous adenoma appendix, Dr. Georgette Dover  . HEMORRHOID BANDING  2014  . LUMBAR EPIDURAL INJECTION  2011   x 2  . MELANOMA EXCISION Left 2012   left arm, not sure about lymph nodes  . REPEAT CESAREAN SECTION     3 in all, last 1973    Current Medications: Outpatient Medications Prior to Visit  Medication Sig Dispense Refill  . albuterol (VENTOLIN HFA) 108 (90 Base) MCG/ACT inhaler INHALE 2 PUFFS EVERY SIX HOURS AS NEEDED FOR WHEEZING OR SHORTNESS OF BREATH 54 g 3  . Ascorbic Acid (VITAMIN C) 500 MG tablet daily. Take 2 tablet daily     . aspirin 81 MG EC tablet Take 1 tablet (81 mg total) by mouth daily. Stop and restart 10/05/2017 30 tablet   . Calcium Carbonate-Vitamin D (CALCIUM-VITAMIN D) 500-200 MG-UNIT per tablet Take 1 tablet by mouth 2 (two) times daily with a meal.      .  cetirizine (ZYRTEC) 10 MG tablet Take 10 mg by mouth daily.    . Cranberry 1000 MG CAPS Take by mouth. Take 2 tablets daily     . fish oil-omega-3 fatty acids 1000 MG capsule Take 1 g by mouth every evening.     . fluticasone (FLONASE) 50 MCG/ACT nasal spray Place 2 sprays into both nostrils daily. 48 g 1  . metoprolol succinate (TOPROL-XL) 50 MG 24 hr tablet TAKE 1 TABLET TWICE DAILY WITH OR IMMEDIATELY FOLLOWING MEALS (Patient taking differently: 75 mg. TAKE 1 TABLET AND HALF TWICE DAILY WITH OR IMMEDIATELY FOLLOWING MEALS) 180 tablet 1  . Multiple Vitamin (MULTIVITAMIN) tablet Take 1 tablet by mouth daily.      .  pantoprazole (PROTONIX) 40 MG tablet TAKE 1 TABLET EVERY MORNING 90 tablet 3  . Peppermint Oil (IBGARD) 90 MG CPCR Take 2 capsules by mouth 2 (two) times daily.    . quinapril (ACCUPRIL) 40 MG tablet TAKE 1 TABLET TWICE DAILY 180 tablet 1  . spironolactone (ALDACTONE) 25 MG tablet TAKE 1 TABLET EVERY DAY 90 tablet 1  . SYNTHROID 112 MCG tablet Take 1 tablet (112 mcg total) by mouth daily before breakfast. 90 tablet 3  . vitamin B-12 (CYANOCOBALAMIN) 500 MCG tablet Take 500 mcg by mouth daily.      . hydrochlorothiazide (MICROZIDE) 12.5 MG capsule Take 1 capsule (12.5 mg total) by mouth daily. 30 capsule 5   No facility-administered medications prior to visit.      Allergies:   Dicyclomine, Nifedipine, Amlodipine, and Theophylline   Social History   Socioeconomic History  . Marital status: Married    Spouse name: Not on file  . Number of children: 3  . Years of education: Not on file  . Highest education level: Not on file  Occupational History  . 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 Financial resource strain: Not on file  . Food insecurity    Worry: Not on file    Inability: Not on file  . Transportation needs    Medical: Not on file    Non-medical: Not on file  Tobacco Use  . Smoking status: Never Smoker  . Smokeless tobacco: Never Used  Substance and Sexual Activity  . Alcohol use: No  . Drug use: No  . Sexual activity: Not on file  Lifestyle  . Physical activity    Days per week: Not on file    Minutes per session: Not on file  . Stress: Not on file  Relationships  . Social Herbalist on phone: Not on file    Gets together: Not on file    Attends religious service: Not on file    Active member of club or organization: Not on file    Attends meetings of clubs or organizations: Not on file    Relationship status: Not on file  Other Topics Concern  . Not on file  Social History Narrative   Married retired lives in  [place]   No alcohol tobacco or drug use   Caffeine is 2 cups of coffee daily   3 children   Elderly mother lives with her and requires some care      Family History:  The patient's family history includes Colon cancer in her maternal uncle, maternal uncle, and mother; Heart disease in her father; Lung cancer in her father.   ROS:   Please see the history of present illness.  ROS All other systems reviewed and are negative.   PHYSICAL EXAM:   VS:  BP (!) 188/94 (BP Location: Left Arm, Patient Position: Sitting, Cuff Size: Large)   Pulse (!) 57   Temp 97.9 F (36.6 C) (Other (Comment)) Comment (Src): forehead  Ht 5\' 2"  (1.575 m)   Wt 178 lb 12.8 oz (81.1 kg)   SpO2 96%   BMI 32.70 kg/m    GEN: Well nourished, well developed, in no acute distress  HEENT: normal  Neck: no JVD, carotid bruits, or masses Cardiac: RRR; no murmurs, rubs, or gallops,no edema  Respiratory:  clear to auscultation bilaterally, normal work of breathing GI: soft, nontender, nondistended, + BS MS: no deformity or atrophy  Skin: warm and dry, no rash Neuro:  Alert and Oriented x 3, Strength and sensation are intact Psych: euthymic mood, full affect  Wt Readings from Last 3 Encounters:  02/07/19 178 lb 12.8 oz (81.1 kg)  01/18/19 178 lb 6.4 oz (80.9 kg)  12/17/18 179 lb (81.2 kg)      Studies/Labs Reviewed:   EKG:  EKG is ordered today.  The ekg ordered today demonstrates sinus rhythm, T wave inversions in inferior and anterior leads  Recent Labs: 05/06/2018: ALT 10 11/10/2018: Hemoglobin 12.1; Platelets 205.0; TSH 1.74 02/07/2019: BUN 18; Creatinine, Ser 1.22; Potassium 4.2; Sodium 140   Lipid Panel    Component Value Date/Time   CHOL 202 (H) 05/06/2018 0954   TRIG 147.0 05/06/2018 0954   TRIG 59 02/12/2006 1105   HDL 43.90 05/06/2018 0954   CHOLHDL 5 05/06/2018 0954   VLDL 29.4 05/06/2018 0954   LDLCALC 128 (H) 05/06/2018 0954    Additional studies/ records that were reviewed today  include:   Myoview 12/17/2018 Study Highlights    Nuclear stress EF: 64%.  The left ventricular ejection fraction is normal (55-65%).  There was no ST segment deviation noted during stress.  The study is normal.  This is a low risk study.   Normal stress nuclear study with no ischemia or infarction.  Gated ejection fraction 64% with normal wall motion.      ASSESSMENT:    1. Essential hypertension   2. Atrial tachycardia ([place])   3. AKI (acute kidney injury) ([place])   4. Thoracic aortic aneurysm without rupture ([place])      PLAN:  In order of problems listed above:  1. Hypertension: Blood pressure elevated in the office, however based on home blood pressure diary, her blood pressure has been very well controlled at home on current therapy.  2. AKI: Obtain basic metabolic panel as last lab work showed elevated creatinine  3. Thoracic aortic aneurysm: Pending repeat CTA of the chest.  4. Atrial tachycardia: Continue current dose of metoprolol.    Medication Adjustments/Labs and Tests Ordered: Current medicines are reviewed at length with the patient today.  Concerns regarding medicines are outlined above.  Medication changes, Labs and Tests ordered today are listed in the Patient Instructions below. Patient Instructions  Medication Instructions:   Your physician recommends that you continue on your current medications as directed. Please refer to the Current Medication list given to you today. *If you need a refill on your cardiac medications before your next appointment, please call your pharmacy*  Lab Work:  You will need to have labs (blood work) drawn today  BMET If you have labs (blood work) drawn today and your tests are completely normal, you will receive your results only by: Marland Kitchen MyChart Message (if you  have MyChart) OR . A paper copy in the mail If you have any lab test that is abnormal or we need to change your treatment, we will call you to review the  results.  Testing/Procedures: NONE ordered at this time of appointment    Follow-Up: At [facility], you and your health needs are our priority.  As part of our continuing mission to provide you with exceptional heart care, we have created designated Provider Care Teams.  These Care Teams include your primary Cardiologist (physician) and Advanced Practice Providers (APPs -  Physician Assistants and Nurse Practitioners) who all work together to provide you with the care you need, when you need it.  Your next appointment:   6 months  The format for your next appointment:   In Person  Provider:   Sanda Klein, MD  Other Instructions      Signed, Almyra Deforest, [place]  02/08/2019 11:27 PM    [address], [place], [place]  [address] Phone: [phone]; Fax: [phone]

## 2019-02-08 ENCOUNTER — Encounter: Payer: Self-pay | Admitting: Physician Assistant

## 2019-02-09 ENCOUNTER — Other Ambulatory Visit: Payer: Self-pay | Admitting: Adult Health

## 2019-02-10 ENCOUNTER — Other Ambulatory Visit: Payer: Self-pay

## 2019-02-10 ENCOUNTER — Ambulatory Visit (INDEPENDENT_AMBULATORY_CARE_PROVIDER_SITE_OTHER)
Admission: RE | Admit: 2019-02-10 | Discharge: 2019-02-10 | Disposition: A | Discharge status: Home / Self Care | Payer: Medicare HMO | Source: Ambulatory Visit | Attending: Cardiovascular Disease | Admitting: Cardiovascular Disease

## 2019-02-10 DIAGNOSIS — I712 Thoracic aortic aneurysm, without rupture: Secondary | ICD-10-CM | POA: Diagnosis not present

## 2019-02-10 DIAGNOSIS — I7121 Aneurysm of the ascending aorta, without rupture: Secondary | ICD-10-CM

## 2019-02-10 MED ORDER — IOHEXOL 350 MG/ML SOLN
80.0000 mL | Freq: Once | INTRAVENOUS | Status: AC | PRN
  Administered 2019-02-10: 15:00:00 80 mL via INTRAVENOUS

## 2019-02-10 NOTE — Telephone Encounter (Signed)
Sent to the pharmacy by e-scribe and pt scheduled for cpx.

## 2019-02-11 ENCOUNTER — Telehealth: Payer: Self-pay | Admitting: *Deleted

## 2019-02-11 DIAGNOSIS — I712 Thoracic aortic aneurysm, without rupture, unspecified: Secondary | ICD-10-CM

## 2019-02-11 DIAGNOSIS — I7121 Aneurysm of the ascending aorta, without rupture: Secondary | ICD-10-CM

## 2019-02-11 NOTE — Telephone Encounter (Signed)
-----   Message from Sanda Klein, MD sent at 02/10/2019  8:34 PM EDT ----- The ascending aorta is only very mildly dilated at 4 cm.  There is no evidence of aortic dissection.  Would reevaluate with CT angiography in 1 year.

## 2019-02-11 NOTE — Telephone Encounter (Signed)
Left a message for the patient to call back.  

## 2019-02-14 NOTE — Telephone Encounter (Signed)
Patient made aware of results and verbalized understanding. Repeat orders placed.    

## 2019-02-22 ENCOUNTER — Other Ambulatory Visit: Payer: Self-pay | Admitting: Adult Health

## 2019-02-23 NOTE — Telephone Encounter (Signed)
DENIED.  REFILL REQUEST IS TOO EARLY.

## 2019-03-12 ENCOUNTER — Encounter (HOSPITAL_COMMUNITY): Payer: Self-pay

## 2019-03-12 ENCOUNTER — Telehealth: Payer: Self-pay | Admitting: Cardiology

## 2019-03-12 ENCOUNTER — Observation Stay (HOSPITAL_COMMUNITY)
Admission: EM | Admit: 2019-03-12 | Discharge: 2019-03-14 | Disposition: A | Discharge status: Home / Self Care | Payer: Medicare HMO | Attending: Family Medicine | Admitting: Family Medicine

## 2019-03-12 ENCOUNTER — Other Ambulatory Visit: Payer: Self-pay

## 2019-03-12 DIAGNOSIS — E039 Hypothyroidism, unspecified: Secondary | ICD-10-CM | POA: Diagnosis not present

## 2019-03-12 DIAGNOSIS — R42 Dizziness and giddiness: Secondary | ICD-10-CM | POA: Diagnosis not present

## 2019-03-12 DIAGNOSIS — Z85820 Personal history of malignant melanoma of skin: Secondary | ICD-10-CM | POA: Insufficient documentation

## 2019-03-12 DIAGNOSIS — G20 Parkinson's disease: Secondary | ICD-10-CM | POA: Insufficient documentation

## 2019-03-12 DIAGNOSIS — Z888 Allergy status to other drugs, medicaments and biological substances status: Secondary | ICD-10-CM | POA: Diagnosis not present

## 2019-03-12 DIAGNOSIS — R0689 Other abnormalities of breathing: Secondary | ICD-10-CM | POA: Diagnosis not present

## 2019-03-12 DIAGNOSIS — I119 Hypertensive heart disease without heart failure: Secondary | ICD-10-CM | POA: Diagnosis not present

## 2019-03-12 DIAGNOSIS — K589 Irritable bowel syndrome without diarrhea: Secondary | ICD-10-CM | POA: Diagnosis not present

## 2019-03-12 DIAGNOSIS — R000 Tachycardia, unspecified: Secondary | ICD-10-CM | POA: Diagnosis not present

## 2019-03-12 DIAGNOSIS — K219 Gastro-esophageal reflux disease without esophagitis: Secondary | ICD-10-CM | POA: Insufficient documentation

## 2019-03-12 DIAGNOSIS — Z20828 Contact with and (suspected) exposure to other viral communicable diseases: Secondary | ICD-10-CM | POA: Diagnosis not present

## 2019-03-12 DIAGNOSIS — I712 Thoracic aortic aneurysm, without rupture: Secondary | ICD-10-CM | POA: Insufficient documentation

## 2019-03-12 DIAGNOSIS — D649 Anemia, unspecified: Secondary | ICD-10-CM | POA: Insufficient documentation

## 2019-03-12 DIAGNOSIS — Z79890 Hormone replacement therapy (postmenopausal): Secondary | ICD-10-CM | POA: Insufficient documentation

## 2019-03-12 DIAGNOSIS — N179 Acute kidney failure, unspecified: Secondary | ICD-10-CM | POA: Insufficient documentation

## 2019-03-12 DIAGNOSIS — J45909 Unspecified asthma, uncomplicated: Secondary | ICD-10-CM | POA: Diagnosis not present

## 2019-03-12 DIAGNOSIS — R0789 Other chest pain: Secondary | ICD-10-CM | POA: Insufficient documentation

## 2019-03-12 DIAGNOSIS — E876 Hypokalemia: Secondary | ICD-10-CM | POA: Insufficient documentation

## 2019-03-12 DIAGNOSIS — I471 Supraventricular tachycardia, unspecified: Secondary | ICD-10-CM | POA: Diagnosis present

## 2019-03-12 DIAGNOSIS — R778 Other specified abnormalities of plasma proteins: Secondary | ICD-10-CM | POA: Insufficient documentation

## 2019-03-12 DIAGNOSIS — Z79899 Other long term (current) drug therapy: Secondary | ICD-10-CM | POA: Insufficient documentation

## 2019-03-12 DIAGNOSIS — Z8249 Family history of ischemic heart disease and other diseases of the circulatory system: Secondary | ICD-10-CM | POA: Diagnosis not present

## 2019-03-12 DIAGNOSIS — I10 Essential (primary) hypertension: Secondary | ICD-10-CM | POA: Diagnosis present

## 2019-03-12 DIAGNOSIS — R0602 Shortness of breath: Secondary | ICD-10-CM | POA: Diagnosis not present

## 2019-03-12 LAB — CBC WITH DIFFERENTIAL/PLATELET
Abs Immature Granulocytes: 0.01 10*3/uL (ref 0.00–0.07)
Basophils Absolute: 0 10*3/uL (ref 0.0–0.1)
Basophils Relative: 0 %
Eosinophils Absolute: 0.1 10*3/uL (ref 0.0–0.5)
Eosinophils Relative: 2 %
HCT: 35.1 % — ABNORMAL LOW (ref 36.0–46.0)
Hemoglobin: 11.2 g/dL — ABNORMAL LOW (ref 12.0–15.0)
Immature Granulocytes: 0 %
Lymphocytes Relative: 23 %
Lymphs Abs: 1.6 10*3/uL (ref 0.7–4.0)
MCH: 30.1 pg (ref 26.0–34.0)
MCHC: 31.9 g/dL (ref 30.0–36.0)
MCV: 94.4 fL (ref 80.0–100.0)
Monocytes Absolute: 0.7 10*3/uL (ref 0.1–1.0)
Monocytes Relative: 10 %
Neutro Abs: 4.5 10*3/uL (ref 1.7–7.7)
Neutrophils Relative %: 65 %
Platelets: 224 10*3/uL (ref 150–400)
RBC: 3.72 MIL/uL — ABNORMAL LOW (ref 3.87–5.11)
RDW: 12.7 % (ref 11.5–15.5)
WBC: 7 10*3/uL (ref 4.0–10.5)
nRBC: 0 % (ref 0.0–0.2)

## 2019-03-12 LAB — BASIC METABOLIC PANEL
Anion gap: 12 (ref 5–15)
BUN: 26 mg/dL — ABNORMAL HIGH (ref 8–23)
CO2: 26 mmol/L (ref 22–32)
Calcium: 8.8 mg/dL — ABNORMAL LOW (ref 8.9–10.3)
Chloride: 101 mmol/L (ref 98–111)
Creatinine, Ser: 1.4 mg/dL — ABNORMAL HIGH (ref 0.44–1.00)
GFR calc Af Amer: 42 mL/min — ABNORMAL LOW (ref >60)
GFR calc non Af Amer: 37 mL/min — ABNORMAL LOW (ref >60)
Glucose, Bld: 147 mg/dL — ABNORMAL HIGH (ref 70–99)
Potassium: 3.4 mmol/L — ABNORMAL LOW (ref 3.5–5.1)
Sodium: 139 mmol/L (ref 135–145)

## 2019-03-12 LAB — TROPONIN I (HIGH SENSITIVITY): Troponin I (High Sensitivity): 17 ng/L (ref <18)

## 2019-03-12 LAB — MAGNESIUM: Magnesium: 1.9 mg/dL (ref 1.7–2.4)

## 2019-03-12 MED ORDER — ADENOSINE 6 MG/2ML IV SOLN: 12.0000 mg | Freq: Once | INTRAVENOUS | Status: DC

## 2019-03-12 MED ORDER — DILTIAZEM LOAD VIA INFUSION
10.0000 mg | Freq: Once | INTRAVENOUS | Status: AC
  Administered 2019-03-12: 22:00:00 10 mg via INTRAVENOUS
  Filled 2019-03-12: qty 10

## 2019-03-12 MED ORDER — ADENOSINE 6 MG/2ML IV SOLN
12.0000 mg | Freq: Once | INTRAVENOUS | Status: AC
  Administered 2019-03-12: 12 mg via INTRAVENOUS

## 2019-03-12 MED ORDER — METOPROLOL TARTRATE 5 MG/5ML IV SOLN: 5.0000 mg | Freq: Once | INTRAVENOUS | Status: DC

## 2019-03-12 MED ORDER — ADENOSINE 6 MG/2ML IV SOLN
6.0000 mg | Freq: Once | INTRAVENOUS | Status: AC
  Administered 2019-03-12: 6 mg via INTRAVENOUS

## 2019-03-12 MED ORDER — DILTIAZEM HCL-DEXTROSE 125-5 MG/125ML-% IV SOLN (PREMIX)
5.0000 mg/h | INTRAVENOUS | Status: DC
  Administered 2019-03-12: 5 mg/h via INTRAVENOUS
  Filled 2019-03-12: qty 125

## 2019-03-12 MED ORDER — ADENOSINE 6 MG/2ML IV SOLN
INTRAVENOUS | Status: AC
  Filled 2019-03-12: qty 10

## 2019-03-12 NOTE — Telephone Encounter (Signed)
Patient has a hx of atrial tachycardia and is on Toprol XL 75mg  BID.  She called tonight stating that her heart is racing with HR 160bpm.  Her BP is .  She called EMS who are there now.  I spoke with EMS and EKG showed atrial tachycardia at 160bpm .  Instructed EMS to transport patient to closest ER which is [facility] for further treatment.

## 2019-03-12 NOTE — ED Provider Notes (Addendum)
[facility] EMERGENCY DEPARTMENT Provider Note   CSN: [id number] Arrival date & time: 03/12/19  2100     History   Chief Complaint Chief Complaint  Patient presents with  . Palpitations    HPI Jasmine Wheeler is a 73 y.o. female.  Presents to ER with palpitations.  Symptoms started approximately an hour prior to arrival.  States that she has had episodes of palpitations before but normally fleeting only lasting a few minutes, never constant like this.  Called cardiologist recommended come to ER.  States has some associated chest tightness and shortness of breath with palpitations.  Earlier today not having any symptoms.     HPI  Past Medical History:  Diagnosis Date  . Adenomatous colon polyp   . Asthma   . Benign gastric polyp - hyperplastic 09/2017  . Chronic low back pain   . Diverticulosis 01/23/2010   left colon  . Heart murmur 2016  . Hypertension   . Hypothyroidism   . IBS (irritable bowel syndrome)     Patient Active Problem List   Diagnosis Date Noted  . Ascending aortic aneurysm ([place]) 06/24/2018  . 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 Irritable bowel syndrome 09/18/2017  . Internal and external prolapsed hemorrhoids 01/20/2013  . Impaired glucose tolerance 02/09/2012  . LOW BACK PAIN 12/17/2009  . Asthma 08/16/2009  . Hypothyroidism 11/03/2006  . Essential hypertension 11/03/2006  . MENOPAUSAL SYNDROME 11/03/2006  . History of colonic polyps 11/03/2006    Past Surgical History:  Procedure Laterality Date  . ABDOMINAL HYSTERECTOMY    . COLONOSCOPY W/ BIOPSIES  multiple  . HEMICOLECTOMY  02/15/10   right, tubulovillous adenoma appendix, Dr. Georgette Dover  . HEMORRHOID BANDING  2014  . LUMBAR EPIDURAL INJECTION  2011   x 2  . MELANOMA EXCISION Left 2012   left arm, not sure about lymph nodes  . REPEAT CESAREAN SECTION     3 in all, last 1973     OB History   No obstetric history on file.      Home Medications    Prior to Admission medications   Medication Sig Start Date End Date Taking? Authorizing Provider  albuterol (VENTOLIN HFA) 108 (90 Base) MCG/ACT inhaler INHALE 2 PUFFS EVERY SIX HOURS AS NEEDED FOR WHEEZING OR SHORTNESS OF BREATH Patient taking differently: Inhale 2 puffs into the lungs every 6 (six) hours as needed for wheezing or shortness of breath.  02/02/19  Yes Nafziger, Tommi Rumps, NP  fluticasone (FLONASE) 50 MCG/ACT nasal spray Place 2 sprays into both nostrils daily. 11/11/18  Yes Nafziger, Tommi Rumps, NP  hydrochlorothiazide (MICROZIDE) 12.5 MG capsule Take 1 capsule (12.5 mg total) by mouth daily. 02/07/19 05/08/19 Yes Almyra Deforest, PA  metoprolol succinate (TOPROL-XL) 50 MG 24 hr tablet TAKE 1 TABLET TWICE DAILY WITH OR IMMEDIATELY FOLLOWING MEALS Patient taking differently: Take 75 mg by mouth 2 (two) times daily. TAKE 1 TABLET AND HALF TWICE DAILY 10/19/18  Yes Nafziger, Tommi Rumps, NP  pantoprazole (PROTONIX) 40 MG tablet TAKE 1 TABLET EVERY MORNING Patient taking differently: Take 40 mg by mouth every morning.  10/18/18  Yes Gatha Mayer, MD  quinapril (ACCUPRIL) 40 MG tablet TAKE 1 TABLET TWICE DAILY Patient taking differently: Take 40 mg by mouth 2 (two) times daily.  10/19/18  Yes Nafziger, Tommi Rumps, NP  spironolactone (ALDACTONE) 25 MG tablet TAKE 1 TABLET EVERY DAY Patient taking differently: Take 25 mg by mouth daily.  02/10/19  Yes Dorothyann Peng, NP  SYNTHROID 112 MCG tablet Take 1 tablet (112 mcg total) by mouth daily before breakfast. 07/12/18  Yes Nafziger, Tommi Rumps, NP  Ascorbic Acid (VITAMIN C) 500 MG tablet daily. Take 2 tablet daily     [provider]  aspirin 81 MG EC tablet Take 1 tablet (81 mg total) by mouth daily. Stop and restart 10/05/2017 09/21/17   Gatha Mayer, MD  Calcium Carbonate-Vitamin D (CALCIUM-VITAMIN D) 500-200 MG-UNIT per tablet Take 1 tablet by mouth 2 (two) times daily with a meal.      [provider]  cetirizine (ZYRTEC) 10 MG tablet  Take 10 mg by mouth daily.    [provider]  Cranberry 1000 MG CAPS Take by mouth. Take 2 tablets daily     [provider]  fish oil-omega-3 fatty acids 1000 MG capsule Take 1 g by mouth every evening.     [provider]  Multiple Vitamin (MULTIVITAMIN) tablet Take 1 tablet by mouth daily.      [provider]  Peppermint Oil (IBGARD) 90 MG CPCR Take 2 capsules by mouth 2 (two) times daily.    [provider]  vitamin B-12 (CYANOCOBALAMIN) 500 MCG tablet Take 500 mcg by mouth daily.      [provider]    Family History Family History  Problem Relation Age of Onset  . Lung cancer Father   . Heart disease Father   . Colon cancer Mother   . Colon cancer Maternal Uncle   . Colon cancer Maternal Uncle     Social History Social History   Tobacco Use  . Smoking status: Never Smoker  . Smokeless tobacco: Never Used  Substance Use Topics  . Alcohol use: No  . Drug use: No     Allergies   Dicyclomine, Nifedipine, Amlodipine, and Theophylline   Review of Systems Review of Systems  Constitutional: Negative for chills and fever.  HENT: Negative for ear pain and sore throat.   Eyes: Negative for pain and visual disturbance.  Respiratory: Negative for cough and shortness of breath.   Cardiovascular: Positive for palpitations. Negative for chest pain.  Gastrointestinal: Negative for abdominal pain and vomiting.  Genitourinary: Negative for dysuria and hematuria.  Musculoskeletal: Negative for arthralgias and back pain.  Skin: Negative for color change and rash.  Neurological: Negative for seizures and syncope.  All other systems reviewed and are negative.    Physical Exam Updated Vital Signs BP (!) 124/94   Pulse (!) 145   Temp 98.9 F (37.2 C) (Oral)   Resp 14   Ht 5\' 2"  (1.575 m)   Wt 79.4 kg   SpO2 98%   BMI 32.01 kg/m   Physical Exam Vitals signs and nursing note reviewed.  Constitutional:      General:  She is not in acute distress.    Appearance: She is well-developed.  HENT:     Head: Normocephalic and atraumatic.  Eyes:     Conjunctiva/sclera: Conjunctivae normal.  Neck:     Musculoskeletal: Neck supple.  Cardiovascular:     Rate and Rhythm: Regular rhythm. Tachycardia present.     Pulses: Normal pulses.     Heart sounds: No murmur.  Pulmonary:     Effort: Pulmonary effort is normal. No respiratory distress.     Breath sounds: Normal breath sounds.  Abdominal:     Palpations: Abdomen is soft.     Tenderness: There is no abdominal tenderness.  Skin:    General: Skin is warm  and dry.     Capillary Refill: Capillary refill takes less than 2 seconds.  Neurological:     General: No focal deficit present.     Mental Status: She is alert and oriented to person, place, and time.  Psychiatric:        Mood and Affect: Mood normal.        Thought Content: Thought content normal.      ED Treatments / Results  Labs (all labs ordered are listed, but only abnormal results are displayed) Labs Reviewed  CBC WITH DIFFERENTIAL/PLATELET - Abnormal; Notable for the following components:      Result Value   RBC 3.72 (*)    Hemoglobin 11.2 (*)    HCT 35.1 (*)    All other components within normal limits  BASIC METABOLIC PANEL - Abnormal; Notable for the following components:   Potassium 3.4 (*)    Glucose, Bld 147 (*)    BUN 26 (*)    Creatinine, Ser 1.40 (*)    Calcium 8.8 (*)    GFR calc non Af Amer 37 (*)    GFR calc Af Amer 42 (*)    All other components within normal limits  MAGNESIUM  TROPONIN I (HIGH SENSITIVITY)    EKG EKG Interpretation  Date/Time:  Saturday March 12 2019 21:39:20 EST Ventricular Rate:  102 PR Interval:    QRS Duration: 88 QT Interval:  356 QTC Calculation: 464 R Axis:   -66 Text Interpretation: Sinus tachycardia Left anterior fascicular block Abnormal R-wave progression, early transition Confirmed by Madalyn Rob [phone]) on 03/12/2019  10:16:35 PM   Radiology No results found.  Procedures .Critical Care Performed by: Lucrezia Starch, MD Authorized by: Lucrezia Starch, MD   Critical care provider statement:    Critical care time (minutes):  60   Critical care was necessary to treat or prevent imminent or life-threatening deterioration of the following conditions:  Cardiac failure   Critical care was time spent personally by me on the following activities:  Discussions with consultants, evaluation of patient's response to treatment, examination of patient, ordering and performing treatments and interventions, ordering and review of laboratory studies, ordering and review of radiographic studies, pulse oximetry, re-evaluation of patient's condition, obtaining history from patient or surrogate and review of old charts   (including critical care time)  Medications Ordered in ED Medications  diltiazem (CARDIZEM) 125 mg in dextrose 5% 125 mL (1 mg/mL) infusion (0 mg/hr Intravenous Paused 03/12/19 2248)  adenosine (ADENOCARD) 6 MG/2ML injection 6 mg (6 mg Intravenous Given 03/12/19 2133)  adenosine (ADENOCARD) 6 MG/2ML injection 12 mg (12 mg Intravenous Given 03/12/19 2138)  diltiazem (CARDIZEM) 1 mg/mL load via infusion 10 mg (10 mg Intravenous Bolus from Bag 03/12/19 2227)     Initial Impression / Assessment and Plan / ED Course  I have reviewed the triage vital signs and the nursing notes.  Pertinent labs & imaging results that were available during my care of the patient were reviewed by me and considered in my medical decision making (see chart for details).  Clinical Course as of Mar 12 2335  Sat Mar 12, 2019  2234 Discussed with Turner with cards, agrees SVT, recommends dilt 10 bolus followed by dilt gtt   [RD]  2247 Rechecked, HR in 70s, back in sinus, pt asymptomatic   [RD]  2255 Discussed with hospitalist who will accept   [RD]    Clinical Course User Index [RD] Lucrezia Starch, MD  73 year old lady presents to ER with palpitations.  EKG concerning for supraventricular tachycardia.  Blood pressure stable.  No change with vagal maneuvers.  Adenosine 6mg  without conversion, adenosine 12mg  successfully converted patient to sinus rhythm however she quickly went back to SVT.  Discussed with cardiology who had previously spoken to patient about case.  Recommended delta bolus, dill drip and hospitalist admission.  Patient converted to sinus soon after diltiazem bolus.  Will admit to hospitalist service for observation overnight.  Dr. Orlin Hilding accepting.   Final Clinical Impressions(s) / ED Diagnoses   Final diagnoses:  SVT (supraventricular tachycardia) [facility])    ED Discharge Orders    None       Lucrezia Starch, MD 03/12/19 [id number]    Lucrezia Starch, MD 03/12/19 2340

## 2019-03-12 NOTE — ED Triage Notes (Addendum)
Pt reports palpitations that started about an hour ago, pt has had this previously with ability to sit down, breathe deeply and sx would resolve, this time it did not. Pt report feeling lightheaded and still having palpitations at this time. No pain, mild sob with exertion. Pt has not had her metoprolol or BP med tonight yet.

## 2019-03-12 NOTE — ED Notes (Signed)
Vagal maneuver attempted without success.

## 2019-03-13 ENCOUNTER — Observation Stay (HOSPITAL_BASED_OUTPATIENT_CLINIC_OR_DEPARTMENT_OTHER): Payer: Medicare HMO

## 2019-03-13 DIAGNOSIS — R9431 Abnormal electrocardiogram [ECG] [EKG]: Secondary | ICD-10-CM

## 2019-03-13 DIAGNOSIS — I471 Supraventricular tachycardia: Secondary | ICD-10-CM | POA: Diagnosis not present

## 2019-03-13 LAB — COMPREHENSIVE METABOLIC PANEL
ALT: 16 U/L (ref 0–44)
AST: 17 U/L (ref 15–41)
Albumin: 3.8 g/dL (ref 3.5–5.0)
Alkaline Phosphatase: 63 U/L (ref 38–126)
Anion gap: 9 (ref 5–15)
BUN: 25 mg/dL — ABNORMAL HIGH (ref 8–23)
CO2: 28 mmol/L (ref 22–32)
Calcium: 8.8 mg/dL — ABNORMAL LOW (ref 8.9–10.3)
Chloride: 104 mmol/L (ref 98–111)
Creatinine, Ser: 1.21 mg/dL — ABNORMAL HIGH (ref 0.44–1.00)
GFR calc Af Amer: 51 mL/min — ABNORMAL LOW (ref >60)
GFR calc non Af Amer: 44 mL/min — ABNORMAL LOW (ref >60)
Glucose, Bld: 103 mg/dL — ABNORMAL HIGH (ref 70–99)
Potassium: 3.9 mmol/L (ref 3.5–5.1)
Sodium: 141 mmol/L (ref 135–145)
Total Bilirubin: 0.6 mg/dL (ref 0.3–1.2)
Total Protein: 6.2 g/dL — ABNORMAL LOW (ref 6.5–8.1)

## 2019-03-13 LAB — RETICULOCYTES
Immature Retic Fract: 7.5 % (ref 2.3–15.9)
RBC.: 3.76 MIL/uL — ABNORMAL LOW (ref 3.87–5.11)
Retic Count, Absolute: 67.7 10*3/uL (ref 19.0–186.0)
Retic Ct Pct: 1.8 % (ref 0.4–3.1)

## 2019-03-13 LAB — IRON AND TIBC
Iron: 46 ug/dL (ref 28–170)
Saturation Ratios: 13 % (ref 10.4–31.8)
TIBC: 364 ug/dL (ref 250–450)
UIBC: 318 ug/dL

## 2019-03-13 LAB — CBC
HCT: 34.8 % — ABNORMAL LOW (ref 36.0–46.0)
Hemoglobin: 11.1 g/dL — ABNORMAL LOW (ref 12.0–15.0)
MCH: 30.6 pg (ref 26.0–34.0)
MCHC: 31.9 g/dL (ref 30.0–36.0)
MCV: 95.9 fL (ref 80.0–100.0)
Platelets: 198 10*3/uL (ref 150–400)
RBC: 3.63 MIL/uL — ABNORMAL LOW (ref 3.87–5.11)
RDW: 13.1 % (ref 11.5–15.5)
WBC: 4.8 10*3/uL (ref 4.0–10.5)
nRBC: 0 % (ref 0.0–0.2)

## 2019-03-13 LAB — ECHOCARDIOGRAM COMPLETE
Height: 62 in
Weight: 2843.05 oz

## 2019-03-13 LAB — TROPONIN I (HIGH SENSITIVITY)
Troponin I (High Sensitivity): 107 ng/L (ref <18)
Troponin I (High Sensitivity): 310 ng/L (ref <18)

## 2019-03-13 LAB — VITAMIN B12: Vitamin B-12: 927 pg/mL — ABNORMAL HIGH (ref 180–914)

## 2019-03-13 LAB — SARS CORONAVIRUS 2 (TAT 6-24 HRS): SARS Coronavirus 2: NEGATIVE

## 2019-03-13 LAB — FERRITIN: Ferritin: 57 ng/mL (ref 11–307)

## 2019-03-13 LAB — MRSA PCR SCREENING: MRSA by PCR: NEGATIVE

## 2019-03-13 LAB — FOLATE: Folate: 50.3 ng/mL (ref >5.9)

## 2019-03-13 LAB — TSH: TSH: 1.332 u[IU]/mL (ref 0.350–4.500)

## 2019-03-13 MED ORDER — ALBUTEROL SULFATE HFA 108 (90 BASE) MCG/ACT IN AERS
2.0000 | INHALATION_SPRAY | Freq: Four times a day (QID) | RESPIRATORY_TRACT | Status: DC | PRN
  Filled 2019-03-13: qty 6.7

## 2019-03-13 MED ORDER — METOPROLOL SUCCINATE ER 50 MG PO TB24
75.0000 mg | ORAL_TABLET | Freq: Two times a day (BID) | ORAL | Status: DC
  Administered 2019-03-13: 75 mg via ORAL
  Filled 2019-03-13: qty 1

## 2019-03-13 MED ORDER — TRAMADOL HCL 50 MG PO TABS: 50.0000 mg | ORAL_TABLET | Freq: Four times a day (QID) | ORAL | Status: DC | PRN

## 2019-03-13 MED ORDER — ASPIRIN EC 81 MG PO TBEC
81.0000 mg | DELAYED_RELEASE_TABLET | Freq: Every day | ORAL | Status: DC
  Administered 2019-03-13 – 2019-03-14 (×2): 81 mg via ORAL
  Filled 2019-03-13 (×2): qty 1

## 2019-03-13 MED ORDER — HEPARIN SODIUM (PORCINE) 5000 UNIT/ML IJ SOLN
5000.0000 [IU] | Freq: Three times a day (TID) | INTRAMUSCULAR | Status: DC
  Administered 2019-03-13 – 2019-03-14 (×4): 5000 [IU] via SUBCUTANEOUS
  Filled 2019-03-13 (×4): qty 1

## 2019-03-13 MED ORDER — ACETAMINOPHEN 650 MG RE SUPP: 650.0000 mg | Freq: Four times a day (QID) | RECTAL | Status: DC | PRN

## 2019-03-13 MED ORDER — POLYETHYLENE GLYCOL 3350 17 G PO PACK: 17.0000 g | PACK | Freq: Every day | ORAL | Status: DC | PRN

## 2019-03-13 MED ORDER — ONDANSETRON HCL 4 MG PO TABS: 4.0000 mg | ORAL_TABLET | Freq: Four times a day (QID) | ORAL | Status: DC | PRN

## 2019-03-13 MED ORDER — VITAMIN B-12 1000 MCG PO TABS
500.0000 ug | ORAL_TABLET | Freq: Every day | ORAL | Status: DC
  Administered 2019-03-13 – 2019-03-14 (×2): 500 ug via ORAL
  Filled 2019-03-13 (×2): qty 1
  Filled 2019-03-13 (×2): qty 0.5

## 2019-03-13 MED ORDER — METOPROLOL SUCCINATE ER 50 MG PO TB24
50.0000 mg | ORAL_TABLET | Freq: Two times a day (BID) | ORAL | Status: DC
  Administered 2019-03-13 – 2019-03-14 (×2): 50 mg via ORAL
  Filled 2019-03-13 (×2): qty 1

## 2019-03-13 MED ORDER — ONDANSETRON HCL 4 MG/2ML IJ SOLN: 4.0000 mg | Freq: Four times a day (QID) | INTRAMUSCULAR | Status: DC | PRN

## 2019-03-13 MED ORDER — HYDROCHLOROTHIAZIDE 12.5 MG PO CAPS
12.5000 mg | ORAL_CAPSULE | Freq: Every day | ORAL | Status: DC
  Administered 2019-03-13: 12.5 mg via ORAL
  Filled 2019-03-13 (×3): qty 1

## 2019-03-13 MED ORDER — CHLORHEXIDINE GLUCONATE CLOTH 2 % EX PADS
6.0000 | MEDICATED_PAD | Freq: Every day | CUTANEOUS | Status: DC
  Administered 2019-03-13: 6 via TOPICAL

## 2019-03-13 MED ORDER — LISINOPRIL 10 MG PO TABS
40.0000 mg | ORAL_TABLET | Freq: Every day | ORAL | Status: DC
  Administered 2019-03-14: 40 mg via ORAL
  Filled 2019-03-13: qty 4

## 2019-03-13 MED ORDER — PANTOPRAZOLE SODIUM 40 MG PO TBEC
40.0000 mg | DELAYED_RELEASE_TABLET | Freq: Every morning | ORAL | Status: DC
  Administered 2019-03-13 – 2019-03-14 (×2): 40 mg via ORAL
  Filled 2019-03-13 (×2): qty 1

## 2019-03-13 MED ORDER — SPIRONOLACTONE 25 MG PO TABS
25.0000 mg | ORAL_TABLET | Freq: Every day | ORAL | Status: DC
  Administered 2019-03-13 – 2019-03-14 (×2): 25 mg via ORAL
  Filled 2019-03-13: qty 0.5
  Filled 2019-03-13 (×2): qty 1
  Filled 2019-03-13: qty 0.5

## 2019-03-13 MED ORDER — TRAZODONE HCL 50 MG PO TABS: 25.0000 mg | ORAL_TABLET | Freq: Every evening | ORAL | Status: DC | PRN

## 2019-03-13 MED ORDER — LORATADINE 10 MG PO TABS
10.0000 mg | ORAL_TABLET | Freq: Every day | ORAL | Status: DC
  Administered 2019-03-13 – 2019-03-14 (×2): 10 mg via ORAL
  Filled 2019-03-13 (×2): qty 1

## 2019-03-13 MED ORDER — LEVOTHYROXINE SODIUM 112 MCG PO TABS
112.0000 ug | ORAL_TABLET | Freq: Every day | ORAL | Status: DC
  Administered 2019-03-13 – 2019-03-14 (×2): 112 ug via ORAL
  Filled 2019-03-13 (×4): qty 1

## 2019-03-13 MED ORDER — SODIUM CHLORIDE 0.9 % IV SOLN
INTRAVENOUS | Status: AC
  Administered 2019-03-13: 07:00:00 via INTRAVENOUS

## 2019-03-13 MED ORDER — POTASSIUM CHLORIDE 20 MEQ PO PACK
20.0000 meq | PACK | Freq: Once | ORAL | Status: AC
  Administered 2019-03-13: 20 meq via ORAL
  Filled 2019-03-13: qty 1

## 2019-03-13 MED ORDER — LISINOPRIL 10 MG PO TABS
40.0000 mg | ORAL_TABLET | Freq: Once | ORAL | Status: AC
  Administered 2019-03-13: 10:00:00 40 mg via ORAL
  Filled 2019-03-13: qty 4

## 2019-03-13 MED ORDER — ACETAMINOPHEN 325 MG PO TABS
650.0000 mg | ORAL_TABLET | Freq: Four times a day (QID) | ORAL | Status: DC | PRN
  Administered 2019-03-14: 650 mg via ORAL
  Filled 2019-03-13: qty 2

## 2019-03-13 MED ORDER — QUINAPRIL HCL 10 MG PO TABS: 40.0000 mg | ORAL_TABLET | Freq: Two times a day (BID) | ORAL | Status: DC

## 2019-03-13 MED ORDER — FLUTICASONE PROPIONATE 50 MCG/ACT NA SUSP
2.0000 | Freq: Every day | NASAL | Status: DC
  Administered 2019-03-13 – 2019-03-14 (×2): 2 via NASAL
  Filled 2019-03-13 (×2): qty 16

## 2019-03-13 NOTE — Progress Notes (Signed)
CRITICAL VALUE ALERT  Critical Value:  Trop 310  Date & Time Notied:  03/13/19 1005  Provider Notified: Maurene Capes, MD  Orders Received/Actions taken: awaiting further instructions at this time  Celestia Khat, RN

## 2019-03-13 NOTE — ED Notes (Signed)
Date and time results received: 03/13/19 0052   Test: TROPONIN Critical Value: 107  Name of Provider Notified: Betsey Holiday, MD

## 2019-03-13 NOTE — Progress Notes (Signed)
  Patient seen and evaluated, chart reviewed, please see EMR for updated orders. Please see full H&P dictated by admitting physician Dr. Rolla Plate  for same date of service.    Brief Summary:- 73 y.o. female, with hx of IBS, Hypothyroidism, HTN,  , chronic pain and asthma admitted on 03/13/2019 with SVT, converted to sinus with iv Cardizem after failing IV adenosine  A/p 1)PSVT-patient with history of paroxysmal SVT, back in sinus rhythm after IV Cardizem, initially failed IV adenosine -TSH is 1.3, mag 1.9, potassium is up to 3.9 from 3.4 on admission -Stop HCTZ -Decrease metoprolol to 50mg  twice daily (from 75 mg) due to bradycardia  2)Elevated Troponin--up to 301, suspect due to demand ischemia in the setting of persistent SVT-- -echo without regional wall motion normalities, EF preserved at 60 to 65% with d1CHF  3)AKI----acute kidney injury due to hypoperfusion in the setting of persistent SVT compounded by Aldactone, HCTZ and quinapril use-     creatinine on admission= 1.4 ,   baseline creatinine = 1.1 (11/10/18)   , creatinine is now=1.21      , renally adjust medications, avoid nephrotoxic agents/dehydration/hypotension -HCTZ has been discontinued, -Decrease ACE (will use lisinopril 40 daily rather than twice daily)  4)Hypothyroidism--- stable, TSH 1.3 continue thyroid replacement  5) chronic anemia--hemoglobin currently above 11 which is close to patient's baseline-iron studies, [id number] and folic acid levels  within normal limits  6)HTN--- stable, metoprolol adjusted as in #1 above, ACE inhibitor adjusted as in #3 above   Disposition--- possible discharge home in a.m. if no further significant arrhythmia and hemodynamically stable

## 2019-03-13 NOTE — H&P (Signed)
TRH H&P    Patient Demographics:    Jasmine Wheeler, is a 73 y.o. female  MRN: [id number]  DOB - [birth year]  Admit Date - 03/12/2019  Referring MD/NP/PA: Dr. Roslynn Amble  Outpatient Primary MD for the patient is Dorothyann Peng, NP  Patient coming from: home  Chief complaint- "my heart took off"   HPI:    Jasmine Wheeler  is a 73 y.o. female, with hx of IBS, Hypothyroidism, HTN, Aortic murmur, chronic pain and asthma presents to the ER today for palpitations.  Patient reports that the palpitations started around 7:30 PM when she was in a stressful situation.  She had had appliances delivered to her house and the delirium and right over her neighbors mailbox.  Patient went over to tell her neighbor about the incident and while she was standing there discussing it with her she started to notice her heart was racing.  She reports that she stood with a neighbor for approximately 5 minutes, before she said she had to go home and rest.  She went home and sat down in the recliner and waited another 5 minutes.  Palpitations did not resolve.  In the past her palpitations only lasted a couple of minutes and this would have been in enough to resolve them.  Since the palpitations were not going away, she called her cardiologist.  While she was waiting for the cardiologist to call back her husband called EMS.  Patient reports that during this time she was short of breath, and lightheaded.  She reports she is always lightheaded before palpitations start.  She had no chest pain and no diaphoresis.  Usually patient is able to make the palpitations go away by bending over, it did not work this time.  By the time the cardiologist called back, EMS was there.  Cardiologist spoke with EMS on the phone and documented that patient was in atrial tachycardia.  Patient was advised to go to the ER.  Patient reports that she has a history of AV block  and an enlarged aortic valve.  She sees cardiology for these problems.  They were initially asymptomatic problems, but she had to have cardiology clearance for a procedure-and that is when they discovered AV block and enlarged aortic valve.  Patient reports that in the last 2 to 3 months she has had a pharmacologic stress test and a myocardial perfusion study.  She reports both studies were normal.  Patient reports that she does seem to have palpitations when she is in stressful situations.  Patient has had stressors recently, mother passed this year, has been diagnosed with Parkinson's 3 months ago.  Patient reports that she is prescribed metoprolol, quinapril, spironolactone, and hydrochlorothiazide by her cardiologist.  She reports compliance with these medications.  Patient is no longer taking Norvasc which had been prescribed before because she was not able to tolerate it.  Patient does report that she has had a heart monitor for 2 weeks at home.  She reports nothing abnormal was found with this monitor.  In the ER patient was  initially in SVT with a heart rate of 154.  Patient was given adenosine 6 mg, and then adenosine 12 mg, with no relief.  Cardiology was consulted and advised diltiazem bolus and then diltiazem drip.  These both were administered.  Patient converted to sinus rhythm after 30 minutes on the diltiazem drip.  Diltiazem drip was paused and patient has maintained sinus rhythm.  At the time of my exam patient was in sinus rhythm on the monitor with a heart rate of 71, and a blood pressure of 126/79.  Patient had a hypokalemia in the ER at 3.4.  Initial troponin was normal at 17 and then elevated to 107.  Patient's mag was normal at 1.9.  Patient's hemoglobin was slightly low at 11.2.  Last hemoglobin was 12.1 5 months ago.  Admission was requested for observation on heart monitor.    Review of systems:    In addition to the HPI above,  No Fever-chills, No Headache, No changes with  Vision or hearing, patient does admit to being lightheaded No problems swallowing food or Liquids, No Chest pain, Cough or positive for shortness of breath , No Abdominal pain, No Nausea or Vomiting, bowel movements are regular, No Blood in stool or Urine, No dysuria, No new skin rashes or bruises, No new joints pains-aches,  No new weakness, tingling, numbness in any extremity, No recent weight gain or loss, No polyuria, polydypsia or polyphagia,   All other systems reviewed and are negative.    Past History of the following :    Past Medical History:  Diagnosis Date  . Adenomatous colon polyp   . Asthma   . Benign gastric polyp - hyperplastic 09/2017  . Chronic low back pain   . Diverticulosis 01/23/2010   left colon  . Heart murmur 2016  . Hypertension   . Hypothyroidism   . IBS (irritable bowel syndrome)       Past Surgical History:  Procedure Laterality Date  . ABDOMINAL HYSTERECTOMY    . COLONOSCOPY W/ BIOPSIES  multiple  . HEMICOLECTOMY  02/15/10   right, tubulovillous adenoma appendix, Dr. Georgette Dover  . HEMORRHOID BANDING  2014  . LUMBAR EPIDURAL INJECTION  2011   x 2  . MELANOMA EXCISION Left 2012   left arm, not sure about lymph nodes  . REPEAT CESAREAN SECTION     3 in all, last 57      Social History:      Social History   Tobacco Use  . Smoking status: Never Smoker  . Smokeless tobacco: Never Used  Substance Use Topics  . Alcohol use: No       Family History :     Family History  Problem Relation Age of Onset  . Lung cancer Father   . Heart disease Father   . Colon cancer Mother   . Colon cancer Maternal Uncle   . Colon cancer Maternal Uncle    Family history reviewed   Home Medications:   Prior to Admission medications   Medication Sig Start Date End Date Taking? Authorizing Provider  albuterol (VENTOLIN HFA) 108 (90 Base) MCG/ACT inhaler INHALE 2 PUFFS EVERY SIX HOURS AS NEEDED FOR WHEEZING OR SHORTNESS OF BREATH Patient  taking differently: Inhale 2 puffs into the lungs every 6 (six) hours as needed for wheezing or shortness of breath.  02/02/19  Yes Nafziger, Tommi Rumps, NP  fluticasone (FLONASE) 50 MCG/ACT nasal spray Place 2 sprays into both nostrils daily. 11/11/18  Yes Dorothyann Peng, NP  hydrochlorothiazide (MICROZIDE) 12.5 MG capsule Take 1 capsule (12.5 mg total) by mouth daily. 02/07/19 05/08/19 Yes Almyra Deforest, PA  metoprolol succinate (TOPROL-XL) 50 MG 24 hr tablet TAKE 1 TABLET TWICE DAILY WITH OR IMMEDIATELY FOLLOWING MEALS Patient taking differently: Take 75 mg by mouth 2 (two) times daily. TAKE 1 TABLET AND HALF TWICE DAILY 10/19/18  Yes Nafziger, Tommi Rumps, NP  pantoprazole (PROTONIX) 40 MG tablet TAKE 1 TABLET EVERY MORNING Patient taking differently: Take 40 mg by mouth every morning.  10/18/18  Yes Gatha Mayer, MD  quinapril (ACCUPRIL) 40 MG tablet TAKE 1 TABLET TWICE DAILY Patient taking differently: Take 40 mg by mouth 2 (two) times daily.  10/19/18  Yes Nafziger, Tommi Rumps, NP  spironolactone (ALDACTONE) 25 MG tablet TAKE 1 TABLET EVERY DAY Patient taking differently: Take 25 mg by mouth daily.  02/10/19  Yes Nafziger, Tommi Rumps, NP  SYNTHROID 112 MCG tablet Take 1 tablet (112 mcg total) by mouth daily before breakfast. 07/12/18  Yes Nafziger, Tommi Rumps, NP  Ascorbic Acid (VITAMIN C) 500 MG tablet daily. Take 2 tablet daily     [provider]  aspirin 81 MG EC tablet Take 1 tablet (81 mg total) by mouth daily. Stop and restart 10/05/2017 09/21/17   Gatha Mayer, MD  Calcium Carbonate-Vitamin D (CALCIUM-VITAMIN D) 500-200 MG-UNIT per tablet Take 1 tablet by mouth 2 (two) times daily with a meal.      [provider]  cetirizine (ZYRTEC) 10 MG tablet Take 10 mg by mouth daily.    [provider]  Cranberry 1000 MG CAPS Take by mouth. Take 2 tablets daily     [provider]  fish oil-omega-3 fatty acids 1000 MG capsule Take 1 g by mouth every evening.     [provider]   Multiple Vitamin (MULTIVITAMIN) tablet Take 1 tablet by mouth daily.      [provider]  Peppermint Oil (IBGARD) 90 MG CPCR Take 2 capsules by mouth 2 (two) times daily.    [provider]  vitamin B-12 (CYANOCOBALAMIN) 500 MCG tablet Take 500 mcg by mouth daily.      [provider]     Allergies:     Allergies  Allergen Reactions  . Dicyclomine Nausea Only and Other (See Comments)    Nausea, tingling in her whole body, couldn't sleep, made lights bright  . Nifedipine Other (See Comments)    Dropped blood pressure and heart rate too much  . Amlodipine     Dizzy and feel like almost passing out  . Theophylline Other (See Comments)    Patient cannot recall reaction     Physical Exam:   Vitals  Blood pressure 137/82, pulse 69, temperature 98.9 F (37.2 C), temperature source Oral, resp. rate (!) 21, height 5\' 2"  (1.575 m), weight 79.4 kg, SpO2 94 %.  1.  General: Lying supine in bed in no acute distress  2. Psychiatric: Mood and affect are normal for situation  3. Neurologic: Cranial nerves II through XII are grossly intact without focal deficit on limited exam  4. HEENMT:  Head is atraumatic normocephalic pupils are reactive to light neck is supple trachea is midline  5. Respiratory : Lungs are clear to auscultation bilaterally  6. Cardiovascular : H RRR with holosystolic murmur  7. Gastrointestinal:  Abdomen is soft nondistended nontender to palpation with normal bowel sounds  8. Skin:  Skin is without acute lesions  9.Musculoskeletal:  2+ pitting edema in the lower extremities bilaterally  Data Review:    CBC Recent Labs  Lab 03/12/19 2145  WBC 7.0  HGB 11.2*  HCT 35.1*  PLT 224  MCV 94.4  MCH 30.1  MCHC 31.9  RDW 12.7  LYMPHSABS 1.6  MONOABS 0.7  EOSABS 0.1  BASOSABS 0.0   ------------------------------------------------------------------------------------------------------------------  Results for orders  placed or performed during the hospital encounter of 03/12/19 (from the past 48 hour(s))  CBC with Differential     Status: Abnormal   Collection Time: 03/12/19  9:45 PM  Result Value Ref Range   WBC 7.0 4.0 - 10.5 K/uL   RBC 3.72 (L) 3.87 - 5.11 MIL/uL   Hemoglobin 11.2 (L) 12.0 - 15.0 g/dL   HCT 35.1 (L) 36.0 - 46.0 %   MCV 94.4 80.0 - 100.0 fL   MCH 30.1 26.0 - 34.0 pg   MCHC 31.9 30.0 - 36.0 g/dL   RDW 12.7 11.5 - 15.5 %   Platelets 224 150 - 400 K/uL   nRBC 0.0 0.0 - 0.2 %   Neutrophils Relative % 65 %   Neutro Abs 4.5 1.7 - 7.7 K/uL   Lymphocytes Relative 23 %   Lymphs Abs 1.6 0.7 - 4.0 K/uL   Monocytes Relative 10 %   Monocytes Absolute 0.7 0.1 - 1.0 K/uL   Eosinophils Relative 2 %   Eosinophils Absolute 0.1 0.0 - 0.5 K/uL   Basophils Relative 0 %   Basophils Absolute 0.0 0.0 - 0.1 K/uL   Immature Granulocytes 0 %   Abs Immature Granulocytes 0.01 0.00 - 0.07 K/uL    Comment: Performed at [facility], [address] [place], [place] [id number]  Basic metabolic panel     Status: Abnormal   Collection Time: 03/12/19  9:45 PM  Result Value Ref Range   Sodium 139 135 - 145 mmol/L   Potassium 3.4 (L) 3.5 - 5.1 mmol/L   Chloride 101 98 - 111 mmol/L   CO2 26 22 - 32 mmol/L   Glucose, Bld 147 (H) 70 - 99 mg/dL   BUN 26 (H) 8 - 23 mg/dL   Creatinine, Ser 1.40 (H) 0.44 - 1.00 mg/dL   Calcium 8.8 (L) 8.9 - 10.3 mg/dL   GFR calc non Af Amer 37 (L) >60 mL/min   GFR calc Af Amer 42 (L) >60 mL/min   Anion gap 12 5 - 15    Comment: Performed at [facility], [address], [place], [place] [address]  Magnesium     Status: None   Collection Time: 03/12/19  9:45 PM  Result Value Ref Range   Magnesium 1.9 1.7 - 2.4 mg/dL    Comment: Performed at [facility], [address], [place], [place] [address]  Troponin I (High Sensitivity)     Status: None   Collection Time: 03/12/19  9:45 PM  Result Value Ref Range   Troponin I (High Sensitivity) 17 <18 ng/L    Comment: (NOTE) Elevated  high sensitivity troponin I (hsTnI) values and significant  changes across serial measurements may suggest ACS but many other  chronic and acute conditions are known to elevate hsTnI results.  Refer to the "Links" section for chest pain algorithms and additional  guidance. Performed at [facility], [address], [place], [place] [address]     Chemistries  Recent Labs  Lab 03/12/19 2145  NA 139  K 3.4*  CL 101  CO2 26  GLUCOSE 147*  BUN 26*  CREATININE 1.40*  CALCIUM 8.8*  MG 1.9   ------------------------------------------------------------------------------------------------------------------  ------------------------------------------------------------------------------------------------------------------  GFR: Estimated Creatinine Clearance: 33.9 mL/min (A) (by C-G formula based on SCr of 1.4 mg/dL (H)). Liver Function Tests: No results for input(s): AST, ALT, ALKPHOS, BILITOT, PROT, ALBUMIN in the last 168 hours. No results for input(s): LIPASE, AMYLASE in the last 168 hours. No results for input(s): AMMONIA in the last 168 hours. Coagulation Profile: No results for input(s): INR, PROTIME in the last 168 hours. Cardiac Enzymes: No results for input(s): CKTOTAL, CKMB, CKMBINDEX, TROPONINI in the last 168 hours. BNP (last 3 results) No results for input(s): PROBNP in the last 8760 hours. HbA1C: No results for input(s): HGBA1C in the last 72 hours. CBG: No results for input(s): GLUCAP in the last 168 hours. Lipid Profile: No results for input(s): CHOL, HDL, LDLCALC, TRIG, CHOLHDL, LDLDIRECT in the last 72 hours. Thyroid Function Tests: No results for input(s): TSH, T4TOTAL, FREET4, T3FREE, THYROIDAB in the last 72 hours. Anemia Panel: No results for input(s): VITAMINB12, FOLATE, FERRITIN, TIBC, IRON, RETICCTPCT in the last 72 hours.  --------------------------------------------------------------------------------------------------------------- Urine analysis:     Component Value Date/Time   COLORURINE YELLOW 04/26/2010 2340   APPEARANCEUR CLOUDY (A) 04/26/2010 2340   LABSPEC 1.015 04/26/2010 2340   PHURINE 5.5 04/26/2010 2340   HGBUR NEGATIVE 04/26/2010 2340   HGBUR negative 08/09/2009 0759   BILIRUBINUR neg 05/21/2018 1436   KETONESUR 15 (A) 04/26/2010 2340   PROTEINUR Negative 05/21/2018 1436   PROTEINUR NEGATIVE 04/26/2010 2340   UROBILINOGEN 0.2 05/21/2018 1436   UROBILINOGEN 1.0 04/26/2010 2340   NITRITE neg 05/21/2018 1436   NITRITE NEGATIVE 04/26/2010 2340   LEUKOCYTESUR Negative 05/21/2018 1436      Imaging Results:    No results found.  My personal review of EKG: Rhythm sinus tach with a rate of 102/min, QTc464 ,no Acute ST changes  Before that patient was in SVT with a rate of 154   Assessment & Plan:    Active Problems:   Supraventricular tachycardia ([place])    1. Supraventricular tachycardia 1. Converted to ED after 2 doses of adenosine, diltiazem bolus, and diltiazem drip 2. Hold diltiazem drip at this time as patient is currently in sinus rhythm 3. Continue to monitor on telemetry overnight 4. Continue to trend tropes 5. Twelve-lead EKG if chest pain 6. Admitted to progressive care unit in case patient needs to go back onto diltiazem drip 2. Elevated troponin 1. Initial troponin is 17 repeat troponin is 107 2. Continue to trend 3. EKG for any chest pain 4. Likely demand ischemia from long duration SVT 5. Continue to monitor on telemetry overnight 3. Elevated creatinine 1. Does not meet criteria for AKI 2. Creatinine elevated from 1.22-1.42 3. Will not hold HCTZ, spironolactone, quinapril at this time 4. Continue gentle hydration 5. Recheck creatinine in the a.m. 4. Hypokalemia 1. Replace and recheck 2. Mag is normal 5. Hypothyroid 1. Patient reports multiple adjustments in her Synthroid at the last several physician appointments 2. Continue home Synthroid at this time 3. Check TSH and adjust Synthroid  accordingly 6. Anemia 1. No active signs or symptoms of bleeding 2. Continue to monitor with daily CBC 3. Anemia panel 4. Hemoglobin today 11.2 last hemoglobin 12 7. Hypertension 1. Continue home medications including hydrochlorothiazide, Toprol, quinapril, spironolactone 2. Continue to monitor 8. GERD 1. Continue Protonix   DVT Prophylaxis-Heparin- SCDs   AM Labs Ordered, also please review Full Orders  Family Communication: No family at bedside  Code Status: Full code  Admission status: Observation: Based on patients clinical presentation and evaluation of  above clinical data, I have made determination that patient meets observation criteria at this time.  Time spent in minutes : 65   Rolla Plate M.D on 03/13/2019 at 12:49 AM

## 2019-03-14 DIAGNOSIS — E876 Hypokalemia: Secondary | ICD-10-CM | POA: Diagnosis not present

## 2019-03-14 DIAGNOSIS — N179 Acute kidney failure, unspecified: Secondary | ICD-10-CM | POA: Diagnosis not present

## 2019-03-14 DIAGNOSIS — G20 Parkinson's disease: Secondary | ICD-10-CM | POA: Diagnosis not present

## 2019-03-14 DIAGNOSIS — I119 Hypertensive heart disease without heart failure: Secondary | ICD-10-CM | POA: Diagnosis not present

## 2019-03-14 DIAGNOSIS — I471 Supraventricular tachycardia: Secondary | ICD-10-CM | POA: Diagnosis not present

## 2019-03-14 DIAGNOSIS — E039 Hypothyroidism, unspecified: Secondary | ICD-10-CM | POA: Diagnosis not present

## 2019-03-14 DIAGNOSIS — D649 Anemia, unspecified: Secondary | ICD-10-CM | POA: Diagnosis not present

## 2019-03-14 DIAGNOSIS — Z20828 Contact with and (suspected) exposure to other viral communicable diseases: Secondary | ICD-10-CM | POA: Diagnosis not present

## 2019-03-14 DIAGNOSIS — R778 Other specified abnormalities of plasma proteins: Secondary | ICD-10-CM | POA: Diagnosis not present

## 2019-03-14 LAB — BASIC METABOLIC PANEL
Anion gap: 11 (ref 5–15)
BUN: 27 mg/dL — ABNORMAL HIGH (ref 8–23)
CO2: 28 mmol/L (ref 22–32)
Calcium: 8.9 mg/dL (ref 8.9–10.3)
Chloride: 100 mmol/L (ref 98–111)
Creatinine, Ser: 1.23 mg/dL — ABNORMAL HIGH (ref 0.44–1.00)
GFR calc Af Amer: 50 mL/min — ABNORMAL LOW (ref >60)
GFR calc non Af Amer: 43 mL/min — ABNORMAL LOW (ref >60)
Glucose, Bld: 142 mg/dL — ABNORMAL HIGH (ref 70–99)
Potassium: 4.2 mmol/L (ref 3.5–5.1)
Sodium: 139 mmol/L (ref 135–145)

## 2019-03-14 LAB — TROPONIN I (HIGH SENSITIVITY): Troponin I (High Sensitivity): 105 ng/L (ref <18)

## 2019-03-14 MED ORDER — PANTOPRAZOLE SODIUM 40 MG PO TBEC: 40.0000 mg | DELAYED_RELEASE_TABLET | Freq: Every day | ORAL | 3 refills | Status: DC

## 2019-03-14 MED ORDER — ASPIRIN 81 MG PO TBEC: 81.0000 mg | DELAYED_RELEASE_TABLET | Freq: Every day | ORAL | 12 refills | Status: AC

## 2019-03-14 MED ORDER — METOPROLOL SUCCINATE ER 50 MG PO TB24: 50.0000 mg | ORAL_TABLET | Freq: Two times a day (BID) | ORAL | 2 refills | Status: DC

## 2019-03-14 MED ORDER — QUINAPRIL HCL 40 MG PO TABS: 40.0000 mg | ORAL_TABLET | Freq: Every day | ORAL | 2 refills | Status: DC

## 2019-03-14 MED ORDER — SPIRONOLACTONE 25 MG PO TABS: 25.0000 mg | ORAL_TABLET | Freq: Every day | ORAL | 1 refills | Status: DC

## 2019-03-14 NOTE — Progress Notes (Addendum)
Pt given discharge instructions with understanding. Pt has no questions at this time. Monitor and iv d/c. Pt awaiting ride home. Pt requests to call and make her own follow up appointment.

## 2019-03-14 NOTE — Care Management Obs Status (Signed)
[place] NOTIFICATION   Patient Details  Name: Jasmine Wheeler MRN: [id number] Date of Birth: 04-Jun-1945   Medicare Observation Status Notification Given:  Yes    Tommy Medal 03/14/2019, 12:13 PM

## 2019-03-14 NOTE — Discharge Summary (Signed)
Jasmine Wheeler, is a 73 y.o. female  DOB 1945-07-28  MRN [id number].  Admission date:  03/12/2019  Admitting Physician  Rolla Plate, DO  Discharge Date:  03/14/2019   Primary MD  Dorothyann Peng, NP  Recommendations for primary care physician for things to follow:    1)Please Take Metoprolol 50 mg Twice a day --Take with or immediately following a meal. -May Take additional 1/2 Tablet (25 mg) as needed every 8 hrs for Fast Heart Rate and palpitations- 2) please stop hydrochlorothiazide/HCTZ due to electrolyte abnormalities 3) please change quinapril to 40 mg once a day only 4) follow-up with a primary care physician within a week for recheck and reevaluation  Admission Diagnosis  SVT (supraventricular tachycardia) ([place]) [I47.1]   Discharge Diagnosis  SVT (supraventricular tachycardia) ([place]) [I47.1]    Principal Problem:   Supraventricular tachycardia ([place]) Active Problems:   Hypothyroidism   Essential hypertension      Past Medical History:  Diagnosis Date  . Adenomatous colon polyp   . Asthma   . Benign gastric polyp - hyperplastic 09/2017  . Chronic low back pain   . Diverticulosis 01/23/2010   left colon  . Heart murmur 2016  . Hypertension   . Hypothyroidism   . IBS (irritable bowel syndrome)     Past Surgical History:  Procedure Laterality Date  . ABDOMINAL HYSTERECTOMY    . COLONOSCOPY W/ BIOPSIES  multiple  . HEMICOLECTOMY  02/15/10   right, tubulovillous adenoma appendix, Dr. Georgette Dover  . HEMORRHOID BANDING  2014  . LUMBAR EPIDURAL INJECTION  2011   x 2  . MELANOMA EXCISION Left 2012   left arm, not sure about lymph nodes  . REPEAT CESAREAN SECTION     3 in all, last 1973       HPI  from the history and physical done on the day of admission:     Jasmine Wheeler  is a 73 y.o. female, with hx of IBS, Hypothyroidism, HTN, Aortic murmur, chronic pain and  asthma presents to the ER today for palpitations.  Patient reports that the palpitations started around 7:30 PM when she was in a stressful situation.  She had had appliances delivered to her house and the delirium and right over her neighbors mailbox.  Patient went over to tell her neighbor about the incident and while she was standing there discussing it with her she started to notice her heart was racing.  She reports that she stood with a neighbor for approximately 5 minutes, before she said she had to go home and rest.  She went home and sat down in the recliner and waited another 5 minutes.  Palpitations did not resolve.  In the past her palpitations only lasted a couple of minutes and this would have been in enough to resolve them.  Since the palpitations were not going away, she called her cardiologist.  While she was waiting for the cardiologist to call back her husband called EMS.  Patient reports that during this time she  was short of breath, and lightheaded.  She reports she is always lightheaded before palpitations start.  She had no chest pain and no diaphoresis.  Usually patient is able to make the palpitations go away by bending over, it did not work this time.  By the time the cardiologist called back, EMS was there.  Cardiologist spoke with EMS on the phone and documented that patient was in atrial tachycardia.  Patient was advised to go to the ER.  Patient reports that she has a history of AV block and an enlarged aortic valve.  She sees cardiology for these problems.  They were initially asymptomatic problems, but she had to have cardiology clearance for a procedure-and that is when they discovered AV block and enlarged aortic valve.  Patient reports that in the last 2 to 3 months she has had a pharmacologic stress test and a myocardial perfusion study.  She reports both studies were normal.  Patient reports that she does seem to have palpitations when she is in stressful situations.  Patient  has had stressors recently, mother passed this year, has been diagnosed with Parkinson's 3 months ago.  Patient reports that she is prescribed metoprolol, quinapril, spironolactone, and hydrochlorothiazide by her cardiologist.  She reports compliance with these medications.  Patient is no longer taking Norvasc which had been prescribed before because she was not able to tolerate it.  Patient does report that she has had a heart monitor for 2 weeks at home.  She reports nothing abnormal was found with this monitor.  In the ER patient was initially in SVT with a heart rate of 154.  Patient was given adenosine 6 mg, and then adenosine 12 mg, with no relief.  Cardiology was consulted and advised diltiazem bolus and then diltiazem drip.  These both were administered.  Patient converted to sinus rhythm after 30 minutes on the diltiazem drip.  Diltiazem drip was paused and patient has maintained sinus rhythm.  At the time of my exam patient was in sinus rhythm on the monitor with a heart rate of 71, and a blood pressure of 126/79.  Patient had a hypokalemia in the ER at 3.4.  Initial troponin was normal at 17 and then elevated to 107.  Patient's mag was normal at 1.9.  Patient's hemoglobin was slightly low at 11.2.  Last hemoglobin was 12.1 5 months ago.  Admission was requested for observation on heart monitor.   Hospital Course:    -Brief Summary:- 73 y.o.female,with hx of IBS, Hypothyroidism, HTN,  , chronic pain and asthma admitted on 03/13/2019 with SVT, converted to sinus with iv Cardizem after failing IV adenosine  A/p 1)PSVT-patient with history of paroxysmal SVT, back in sinus rhythm after IV Cardizem, initially failed IV adenosine -TSH is 1.3, mag 1.9, potassium is up to 4.2 from 3.4 on admission -Stop HCTZ -Decrease metoprolol to 50mg  twice daily (from 75 mg) due to bradycardia, may take additional 25 mg every 8 hours as needed tachycardia/palpitations  2)Elevated Troponin--peaked at 301  (down to 105) suspect due to demand ischemia in the setting of persistent SVT-- -echo without regional wall motion normalities, EF preserved at 60 to 65% with d1CHF  3)AKI----acute kidney injury due to hypoperfusion in the setting of persistent SVT compounded by Aldactone, HCTZ and quinapril use-     creatinine on admission= 1.4 ,   baseline creatinine = 1.1 (11/10/18)   , creatinine is now=1.23      , renally adjust medications, avoid nephrotoxic agents/dehydration/hypotension -HCTZ  has been discontinued, -Decrease ACE (will use quinapril 40 daily rather than twice daily)  4)Hypothyroidism--- stable, TSH 1.3 continue thyroid replacement  5) chronic anemia--hemoglobin currently above 11 which is close to patient's baseline-iron studies, [id number] and folic acid levels  within normal limits  6)HTN--- stable, metoprolol adjusted as in #1 above, ACE inhibitor adjusted as in #3 above   Disposition--- possible discharge home in a.m. if no further significant arrhythmia and hemodynamically stable  Discharge Condition: Stable  Follow UP--- PCP for reevaluation in about a week or so   Diet and Activity recommendation:  As advised  Discharge Instructions    Discharge Instructions    Call MD for:  difficulty breathing, headache or visual disturbances   Complete by: As directed    Call MD for:  persistant dizziness or light-headedness   Complete by: As directed    Call MD for:  persistant nausea and vomiting   Complete by: As directed    Call MD for:  temperature >100.4   Complete by: As directed    Diet - low sodium heart healthy   Complete by: As directed    Discharge instructions   Complete by: As directed    1)Please Take Metoprolol 50 mg Twice a day --Take with or immediately following a meal. -May Take additional 1/2 Tablet (25 mg) as needed every 8 hrs for Fast Heart Rate and palpitations- 2) please stop hydrochlorothiazide/HCTZ due to electrolyte abnormalities 3) please change  quinapril to 40 mg once a day only 4) follow-up with a primary care physician within a week for recheck and reevaluation   Increase activity slowly   Complete by: As directed        Discharge Medications     Allergies as of 03/14/2019      Reactions   Dicyclomine Nausea Only, Other (See Comments)   Nausea, tingling in her whole body, couldn't sleep, made lights bright   Nifedipine Other (See Comments)   Dropped blood pressure and heart rate too much   Amlodipine    Dizzy and feel like almost passing out   Theophylline Other (See Comments)   Patient cannot recall reaction      Medication List    STOP taking these medications   hydrochlorothiazide 12.5 MG capsule Commonly known as: MICROZIDE     TAKE these medications   albuterol 108 (90 Base) MCG/ACT inhaler Commonly known as: Ventolin HFA INHALE 2 PUFFS EVERY SIX HOURS AS NEEDED FOR WHEEZING OR SHORTNESS OF BREATH What changed:   how much to take  how to take this  when to take this  reasons to take this  additional instructions   aspirin 81 MG EC tablet Take 1 tablet (81 mg total) by mouth daily with breakfast. What changed:   when to take this  additional instructions   calcium-vitamin D 500-200 MG-UNIT tablet Take 1 tablet by mouth 2 (two) times daily with a meal.   cetirizine 10 MG tablet Commonly known as: ZYRTEC Take 10 mg by mouth daily.   Cranberry 1000 MG Caps Take by mouth. Take 2 tablets daily   fish oil-omega-3 fatty acids 1000 MG capsule Take 1 g by mouth every evening.   fluticasone 50 MCG/ACT nasal spray Commonly known as: FLONASE Place 2 sprays into both nostrils daily.   IBgard 90 MG Cpcr Generic drug: Peppermint Oil Take 2 capsules by mouth 2 (two) times daily.   metoprolol succinate 50 MG 24 hr tablet Commonly known as: TOPROL-XL Take 1 tablet (  50 mg total) by mouth 2 (two) times daily. Take with or immediately following a meal. -May Take additional 1/2 Tablet (25 mg) as  needed every 8 hrs for Fast Heart Rate and palpitations- What changed: See the new instructions.   multivitamin tablet Take 1 tablet by mouth daily.   pantoprazole 40 MG tablet Commonly known as: PROTONIX Take 1 tablet (40 mg total) by mouth daily before breakfast. What changed: when to take this   quinapril 40 MG tablet Commonly known as: ACCUPRIL Take 1 tablet (40 mg total) by mouth daily. What changed: when to take this   spironolactone 25 MG tablet Commonly known as: ALDACTONE Take 1 tablet (25 mg total) by mouth daily.   Synthroid 112 MCG tablet Generic drug: levothyroxine Take 1 tablet (112 mcg total) by mouth daily before breakfast.   vitamin B-12 500 MCG tablet Commonly known as: CYANOCOBALAMIN Take 500 mcg by mouth daily.   vitamin C 500 MG tablet Commonly known as: ASCORBIC ACID daily. Take 2 tablet daily       Major procedures and Radiology Reports - PLEASE review detailed and final reports for all details, in brief -    No results found.  Micro Results   Recent Results (from the past 240 hour(s))  SARS CORONAVIRUS 2 (TAT 6-24 HRS) Nasopharyngeal Nasopharyngeal Swab     Status: None   Collection Time: 03/12/19 10:48 PM   Specimen: Nasopharyngeal Swab  Result Value Ref Range Status   SARS Coronavirus 2 NEGATIVE NEGATIVE Final    Comment: (NOTE) SARS-CoV-2 target nucleic acids are NOT DETECTED. The SARS-CoV-2 RNA is generally detectable in upper and lower respiratory specimens during the acute phase of infection. Negative results do not preclude SARS-CoV-2 infection, do not rule out co-infections with other pathogens, and should not be used as the sole basis for treatment or other patient management decisions. Negative results must be combined with clinical observations, patient history, and epidemiological information. The expected result is Negative. Fact Sheet for Patients: SugarRoll.be Fact Sheet for Healthcare  Providers: https://www.woods-mathews.com/ This test is not yet approved or cleared by the [place] FDA and  has been authorized for detection and/or diagnosis of SARS-CoV-2 by FDA under an Emergency Use Authorization (EUA). This EUA will remain  in effect (meaning this test can be used) for the duration of the COVID-19 declaration under Section 56 4(b)(1) of the Act, 21 U.S.C. section 360bbb-3(b)(1), unless the authorization is terminated or revoked sooner. Performed at [facility], [place] [address], [place], [place] [address]   MRSA PCR Screening     Status: None   Collection Time: 03/13/19  8:27 AM   Specimen: Nasal Mucosa; Nasopharyngeal  Result Value Ref Range Status   MRSA by PCR NEGATIVE NEGATIVE Final    Comment:        The GeneXpert MRSA Assay (FDA approved for NASAL specimens only), is one component of a comprehensive MRSA colonization surveillance program. It is not intended to diagnose MRSA infection nor to guide or monitor treatment for MRSA infections. Performed at [facility], [address], [place], [place] [address]    Today   Subjective    Jasmine Wheeler today has no new complaints -No dizziness no palpitations no shortness of breath no chest pains -Ambulating around the room without further concerns or dyspnea on exertion   Patient has been seen and examined prior to discharge   Objective   Blood pressure (!) 132/91, pulse (!) 57, temperature 97.8 F (36.6 C),  temperature source Oral, resp. rate 12, height 5\' 2"  (1.575 m), weight 80.6 kg, SpO2 95 %.   Intake/Output Summary (Last 24 hours) at 03/14/2019 1056 Last data filed at 03/14/2019 0321 Gross per 24 hour  Intake 1097.5 ml  Output -  Net 1097.5 ml   Exam Gen:- Awake Alert, no acute distress  HEENT:- [place].AT, No sclera icterus Neck-Supple Neck,No JVD,.  Lungs-  CTAB , good air movement bilaterally  CV- S1, S2 normal, regular Abd-  +ve B.Sounds, Abd Soft, No  tenderness,    Extremity/Skin:- No  edema,   good pulses Psych-affect is appropriate, oriented x3 Neuro-no new focal deficits, no tremors    Data Review   CBC w Diff:  Lab Results  Component Value Date   WBC 4.8 03/13/2019   HGB 11.1 (L) 03/13/2019   HCT 34.8 (L) 03/13/2019   PLT 198 03/13/2019   LYMPHOPCT 23 03/12/2019   MONOPCT 10 03/12/2019   EOSPCT 2 03/12/2019   BASOPCT 0 03/12/2019    CMP:  Lab Results  Component Value Date   NA 139 03/14/2019   NA 140 02/07/2019   K 4.2 03/14/2019   CL 100 03/14/2019   CO2 28 03/14/2019   BUN 27 (H) 03/14/2019   BUN 18 02/07/2019   CREATININE 1.23 (H) 03/14/2019   PROT 6.2 (L) 03/13/2019   ALBUMIN 3.8 03/13/2019   BILITOT 0.6 03/13/2019   ALKPHOS 63 03/13/2019   AST 17 03/13/2019   ALT 16 03/13/2019  .   Total Discharge time is about 33 minutes  Roxan Hockey M.D on 03/14/2019 at 10:56 AM  Go to [email or web address] -  for contact info  [facility] - Office  [phone]

## 2019-03-14 NOTE — Discharge Instructions (Signed)
1)Please Take Metoprolol 50 mg Twice a day --Take with or immediately following a meal. -May Take additional 1/2 Tablet (25 mg) as needed every 8 hrs for Fast Heart Rate and palpitations- 2) please stop hydrochlorothiazide/HCTZ due to electrolyte abnormalities 3) please change quinapril to 40 mg once a day only 4) follow-up with a primary care physician within a week for recheck and reevaluation

## 2019-03-15 ENCOUNTER — Telehealth: Payer: Self-pay | Admitting: *Deleted

## 2019-03-15 NOTE — Telephone Encounter (Signed)
Transition Care Management Follow-up Telephone Call   Date discharged? 11.23.2020  How have you been since you were released from the hospital? "I'm doing good getting a little stronger"   Do you understand why you were in the hospital? yes   Do you understand the discharge instructions? yes   Where were you discharged to? Home    Items Reviewed:  Medications reviewed: yes  Allergies reviewed: yes  Dietary changes reviewed: yes  Referrals reviewed: yes   Functional Questionnaire:   Activities of Daily Living (ADLs):   She states they are independent in the following: ambulation, bathing and hygiene, feeding, continence, grooming, toileting and dressing States they require assistance with the following: N/A    Any transportation issues/concerns?: yes   Any patient concerns? Yes. Patient wants to know if she needs cut her IBGARD in half or out cause after she took it she noticed she had some palpitations.    Confirmed importance and date/time of follow-up visits scheduled yes  Provider Appointment booked with Dorothyann Peng, NP  03/30/2019  Confirmed with patient if condition begins to worsen call PCP or go to the ER.  Patient was given the office number and encouraged to call back with question or concerns.  : yes

## 2019-03-29 ENCOUNTER — Other Ambulatory Visit: Payer: Self-pay

## 2019-03-30 ENCOUNTER — Encounter: Payer: Self-pay | Admitting: Adult Health

## 2019-03-30 ENCOUNTER — Ambulatory Visit (INDEPENDENT_AMBULATORY_CARE_PROVIDER_SITE_OTHER): Payer: Medicare HMO | Admitting: Adult Health

## 2019-03-30 ENCOUNTER — Other Ambulatory Visit: Payer: Self-pay

## 2019-03-30 VITALS — BP 160/90 | HR 71 | Temp 96.4°F | Wt 179.0 lb

## 2019-03-30 DIAGNOSIS — I471 Supraventricular tachycardia, unspecified: Secondary | ICD-10-CM

## 2019-03-30 DIAGNOSIS — E039 Hypothyroidism, unspecified: Secondary | ICD-10-CM | POA: Diagnosis not present

## 2019-03-30 DIAGNOSIS — I10 Essential (primary) hypertension: Secondary | ICD-10-CM

## 2019-03-30 DIAGNOSIS — N179 Acute kidney failure, unspecified: Secondary | ICD-10-CM | POA: Diagnosis not present

## 2019-03-30 LAB — CBC WITH DIFFERENTIAL/PLATELET
Basophils Absolute: 0 10*3/uL (ref 0.0–0.1)
Basophils Relative: 0.6 % (ref 0.0–3.0)
Eosinophils Absolute: 0.1 10*3/uL (ref 0.0–0.7)
Eosinophils Relative: 2.7 % (ref 0.0–5.0)
HCT: 34.4 % — ABNORMAL LOW (ref 36.0–46.0)
Hemoglobin: 11.5 g/dL — ABNORMAL LOW (ref 12.0–15.0)
Lymphocytes Relative: 21.9 % (ref 12.0–46.0)
Lymphs Abs: 1.1 10*3/uL (ref 0.7–4.0)
MCHC: 33.6 g/dL (ref 30.0–36.0)
MCV: 90.3 fl (ref 78.0–100.0)
Monocytes Absolute: 0.4 10*3/uL (ref 0.1–1.0)
Monocytes Relative: 7.6 % (ref 3.0–12.0)
Neutro Abs: 3.3 10*3/uL (ref 1.4–7.7)
Neutrophils Relative %: 67.2 % (ref 43.0–77.0)
Platelets: 216 10*3/uL (ref 150.0–400.0)
RBC: 3.81 Mil/uL — ABNORMAL LOW (ref 3.87–5.11)
RDW: 13.6 % (ref 11.5–15.5)
WBC: 4.9 10*3/uL (ref 4.0–10.5)

## 2019-03-30 LAB — BASIC METABOLIC PANEL
BUN: 21 mg/dL (ref 6–23)
CO2: 31 mEq/L (ref 19–32)
Calcium: 9.2 mg/dL (ref 8.4–10.5)
Chloride: 103 mEq/L (ref 96–112)
Creatinine, Ser: 1.2 mg/dL (ref 0.40–1.20)
GFR: 43.69 mL/min — ABNORMAL LOW (ref >60.00)
Glucose, Bld: 91 mg/dL (ref 70–99)
Potassium: 4.3 mEq/L (ref 3.5–5.1)
Sodium: 139 mEq/L (ref 135–145)

## 2019-03-30 NOTE — Progress Notes (Signed)
Subjective:    Patient ID: Jasmine Wheeler, female    DOB: 1946/02/18, 73 y.o.   MRN: [id number]  HPI  73 year old female who  has a past medical history of Adenomatous colon polyp, Asthma, Benign gastric polyp - hyperplastic (09/2017), Chronic low back pain, Diverticulosis (01/23/2010), Heart murmur (2016), Hypertension, Hypothyroidism, and IBS (irritable bowel syndrome).  She presents for TCM visit   Admit Date: 03/12/2019 Discharge Date 03/14/2019  She presented to the emergency room for palpitations.  Palpitations started around 7:30 PM the day of admission.  She had gone through a stressful situation and when the palpitations did not resolve she called EMS.  Associated symptoms included shortness of breath and lightheadedness.  She denied chest pain or diaphoresis.  EKG with EMS showed atrial tachycardia.  She does have a history of AV block and a large aortic valve for which she sees cardiology.  In the ER the patient was initially in SVT with a heart rate of 154.  She was given adenosine 6 mg initially and then adenosine 12 mg with no relief.  Cardiology was consulted and advised diltiazem bolus and then diltiazem drip.  These both were administered and patient converted to sinus rhythm after 30 minutes on the diltiazem drip. Diltiazem drip was then paused and patient maintained sinus rhythm.  Patient had hypokalemia in the ER at 3.4.  Initial troponin was normal at 17 and then elevated to 107.  Her mag was normal at 1.9.  Hemoglobin was slightly low at 11.2.  She was admitted for observation  Hospital Course   SVT  -History of PSVT, back in sinus rhythm after IV Cardizem, initially failed IV adenosine. -HCTZ was stopped -Metoprolol was decreased to 50 mg twice a day from 75 mg due to bradycardia.  She was advised that she can take an additional 25 mg every 8 hours as needed for tachycardia/palpitations  Elevated Troponin  -Peaked at 301.  Suspected due to demand ischemia  in the setting of persistent SVT. -Echo without regional wall motion normalities.  EF preserved at 60 to 65%  AKI  -Acute kidney injury due to hypoperfusion in the setting of persistent SVT compounded by Aldactone, HCTZ, and quinapril use.  Creatinine on admission was 1.4, baseline creatinine 1.1.  On discharge her creatinine was 1.23. -HCTZ was discontinued - Was started on Spirlolactone -Decrease ACE inhibitor, quinapril changed to daily rather than twice daily  Hypothyroidism  -Stable, TSH 1.3.  Continue thyroid replacement  Chronic anemia -Hemoglobin currently above 11 which is close to patient's baseline.  Iron studies, 123456, and folic acid levels within normal limits  Hypertension -Stable, as mentioned above metoprolol and ACE inhibitor adjusted  Today she reports that overall she has been feeling fairly well.  She had 1 episode of palpitations that lasted a few seconds after she was discharged but none since.  She has been monitoring her blood pressures multiple times a day and reports that since her medications have been changed she is noticed that her blood pressure is slowly increasing.  This morning 3 hours after taking her medications her blood pressure was 160/86.  She has had a very mild but constant headache over the last week.  Does have a follow-up appointment with cardiology on 04/14/2019.  She denies chest pain, shortness of breath, dizziness, lightheadedness, or syncopal episodes.   Review of Systems  Constitutional: Negative.   HENT: Negative.   Eyes: Negative.   Respiratory: Negative.   Cardiovascular: Negative.  Gastrointestinal: Negative.   Endocrine: Negative.   Genitourinary: Negative.   Musculoskeletal: Negative.   Skin: Negative.   Allergic/Immunologic: Negative.   Neurological: Positive for headaches.  Hematological: Negative.   Psychiatric/Behavioral: Negative.    Past Medical History:  Diagnosis Date  . Adenomatous colon polyp   . Asthma   .  Benign gastric polyp - hyperplastic 09/2017  . Chronic low back pain   . Diverticulosis 01/23/2010   left colon  . Heart murmur 2016  . Hypertension   . Hypothyroidism   . IBS (irritable bowel syndrome)     Social History   Socioeconomic History  . Marital status: Married    Spouse name: Not on file  . Number of children: 3  . Years of education: Not on file  . Highest education level: Not on file  Occupational History  . 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 Financial resource strain: Not on file  . Food insecurity    Worry: Not on file    Inability: Not on file  . Transportation needs    Medical: Not on file    Non-medical: Not on file  Tobacco Use  . Smoking status: Never Smoker  . Smokeless tobacco: Never Used  Substance and Sexual Activity  . Alcohol use: No  . Drug use: No  . Sexual activity: Not on file  Lifestyle  . Physical activity    Days per week: Not on file    Minutes per session: Not on file  . Stress: Not on file  Relationships  . Social Herbalist on phone: Not on file    Gets together: Not on file    Attends religious service: Not on file    Active member of club or organization: Not on file    Attends meetings of clubs or organizations: Not on file    Relationship status: Not on file  . Intimate partner violence    Fear of current or ex partner: Not on file    Emotionally abused: Not on file    Physically abused: Not on file    Forced sexual activity: Not on file  Other Topics Concern  . Not on file  Social History Narrative   Married retired lives in [place]   No alcohol tobacco or drug use   Caffeine is 2 cups of coffee daily   3 children   Elderly mother lives with her and requires some care     Past Surgical History:  Procedure Laterality Date  . ABDOMINAL HYSTERECTOMY    . COLONOSCOPY W/ BIOPSIES  multiple  . HEMICOLECTOMY  02/15/10   right, tubulovillous adenoma appendix, Dr. Georgette Dover   . HEMORRHOID BANDING  2014  . LUMBAR EPIDURAL INJECTION  2011   x 2  . MELANOMA EXCISION Left 2012   left arm, not sure about lymph nodes  . REPEAT CESAREAN SECTION     3 in all, last 27    Family History  Problem Relation Age of Onset  . Lung cancer Father   . Heart disease Father   . Colon cancer Mother   . Colon cancer Maternal Uncle   . Colon cancer Maternal Uncle     Allergies  Allergen Reactions  . Dicyclomine Nausea Only and Other (See Comments)    Nausea, tingling in her whole body, couldn't sleep, made lights bright  . Nifedipine Other (See Comments)    Dropped blood pressure and heart rate  too much  . Amlodipine     Dizzy and feel like almost passing out  . Theophylline Other (See Comments)    Patient cannot recall reaction    Current Outpatient Medications on File Prior to Visit  Medication Sig Dispense Refill  . albuterol (VENTOLIN HFA) 108 (90 Base) MCG/ACT inhaler INHALE 2 PUFFS EVERY SIX HOURS AS NEEDED FOR WHEEZING OR SHORTNESS OF BREATH (Patient taking differently: Inhale 2 puffs into the lungs every 6 (six) hours as needed for wheezing or shortness of breath. ) 54 g 3  . Ascorbic Acid (VITAMIN C) 500 MG tablet daily. Take 2 tablet daily     . aspirin 81 MG EC tablet Take 1 tablet (81 mg total) by mouth daily with breakfast. 30 tablet 12  . Calcium Carbonate-Vitamin D (CALCIUM-VITAMIN D) 500-200 MG-UNIT per tablet Take 1 tablet by mouth 2 (two) times daily with a meal.      . cetirizine (ZYRTEC) 10 MG tablet Take 10 mg by mouth daily.    . Cranberry 1000 MG CAPS Take by mouth. Take 2 tablets daily     . fish oil-omega-3 fatty acids 1000 MG capsule Take 1 g by mouth every evening.     . fluticasone (FLONASE) 50 MCG/ACT nasal spray Place 2 sprays into both nostrils daily. 48 g 1  . metoprolol succinate (TOPROL-XL) 50 MG 24 hr tablet Take 1 tablet (50 mg total) by mouth 2 (two) times daily. Take with or immediately following a meal. -May Take additional 1/2  Tablet (25 mg) as needed every 8 hrs for Fast Heart Rate and palpitations- 60 tablet 2  . Multiple Vitamin (MULTIVITAMIN) tablet Take 1 tablet by mouth daily.      . pantoprazole (PROTONIX) 40 MG tablet Take 1 tablet (40 mg total) by mouth daily before breakfast. 90 tablet 3  . Peppermint Oil (IBGARD) 90 MG CPCR Take 2 capsules by mouth 2 (two) times daily.    . quinapril (ACCUPRIL) 40 MG tablet Take 1 tablet (40 mg total) by mouth daily. 30 tablet 2  . spironolactone (ALDACTONE) 25 MG tablet Take 1 tablet (25 mg total) by mouth daily. 90 tablet 1  . SYNTHROID 112 MCG tablet Take 1 tablet (112 mcg total) by mouth daily before breakfast. 90 tablet 3  . vitamin B-12 (CYANOCOBALAMIN) 500 MCG tablet Take 500 mcg by mouth daily.       No current facility-administered medications on file prior to visit.     BP (!) 160/90 (BP Location: Left Arm, Cuff Size: Normal)   Pulse 71   Temp (!) 96.4 F (35.8 C) (Temporal)   Wt 179 lb (81.2 kg)   SpO2 96%   BMI 32.74 kg/m       Objective:   Physical Exam Vitals signs and nursing note reviewed.  Constitutional:      General: She is not in acute distress.    Appearance: She is well-developed.  HENT:     Head: Normocephalic and atraumatic.     Right Ear: External ear normal.     Left Ear: External ear normal.     Nose: Nose normal.     Mouth/Throat:     Pharynx: No oropharyngeal exudate.  Eyes:     General:        Right eye: No discharge.        Left eye: No discharge.     Conjunctiva/sclera: Conjunctivae normal.  Neck:     Musculoskeletal: Normal range of motion and neck supple.  Thyroid: No thyromegaly.     Trachea: No tracheal deviation.  Cardiovascular:     Rate and Rhythm: Normal rate and regular rhythm.     Heart sounds: Normal heart sounds. No murmur. No friction rub. No gallop.   Pulmonary:     Effort: Pulmonary effort is normal. No respiratory distress.     Breath sounds: Normal breath sounds. No wheezing or rales.  Chest:      Chest wall: No tenderness.  Abdominal:     General: Bowel sounds are normal. There is no distension.     Palpations: Abdomen is soft. There is no mass.     Tenderness: There is no abdominal tenderness. There is no guarding or rebound.  Musculoskeletal: Normal range of motion.  Lymphadenopathy:     Cervical: No cervical adenopathy.  Skin:    General: Skin is warm and dry.     Coloration: Skin is not pale.     Findings: No erythema or rash.  Neurological:     Mental Status: She is alert and oriented to person, place, and time.     Cranial Nerves: No cranial nerve deficit.     Coordination: Coordination normal.  Psychiatric:        Behavior: Behavior normal.        Thought Content: Thought content normal.        Judgment: Judgment normal.       Assessment & Plan:  1. Essential hypertension -Due to increasing blood pressures we will have her go back to taking quinapril 40 mg twice daily.  Will check BMP today and then likely in 2 weeks.  AKI also possibly due to hydrochlorothiazide use - CBC with Differential/Platelet - Basic Metabolic Panel  2. Supraventricular tachycardia ([place]) -We reviewed her hospital discharge instructions, labs, imaging, and procedures while she was admitted.  All questions answered. - Normal sinus today  - CBC with Differential/Platelet - Basic Metabolic Panel  3. AKI (acute kidney injury) ([place])  - CBC with Differential/Platelet - Basic Metabolic Panel  4. Hypothyroidism, unspecified type - Continue with synthroid   Dorothyann Peng, NP

## 2019-03-31 ENCOUNTER — Encounter: Payer: Self-pay | Admitting: Adult Health

## 2019-03-31 ENCOUNTER — Other Ambulatory Visit: Payer: Self-pay | Admitting: Adult Health

## 2019-03-31 DIAGNOSIS — N179 Acute kidney failure, unspecified: Secondary | ICD-10-CM

## 2019-04-03 ENCOUNTER — Other Ambulatory Visit: Payer: Self-pay

## 2019-04-03 ENCOUNTER — Emergency Department (HOSPITAL_COMMUNITY)
Admission: EM | Admit: 2019-04-03 | Discharge: 2019-04-03 | Disposition: A | Discharge status: Home / Self Care | Payer: Medicare HMO | Attending: Emergency Medicine | Admitting: Emergency Medicine

## 2019-04-03 ENCOUNTER — Encounter (HOSPITAL_COMMUNITY): Payer: Self-pay | Admitting: Emergency Medicine

## 2019-04-03 DIAGNOSIS — Z79899 Other long term (current) drug therapy: Secondary | ICD-10-CM | POA: Insufficient documentation

## 2019-04-03 DIAGNOSIS — I471 Supraventricular tachycardia: Secondary | ICD-10-CM | POA: Insufficient documentation

## 2019-04-03 DIAGNOSIS — R519 Headache, unspecified: Secondary | ICD-10-CM | POA: Diagnosis not present

## 2019-04-03 DIAGNOSIS — E039 Hypothyroidism, unspecified: Secondary | ICD-10-CM | POA: Diagnosis not present

## 2019-04-03 DIAGNOSIS — I10 Essential (primary) hypertension: Secondary | ICD-10-CM | POA: Insufficient documentation

## 2019-04-03 DIAGNOSIS — Z7982 Long term (current) use of aspirin: Secondary | ICD-10-CM | POA: Insufficient documentation

## 2019-04-03 MED ORDER — CLONIDINE HCL 0.2 MG PO TABS
0.2000 mg | ORAL_TABLET | Freq: Once | ORAL | Status: AC
  Administered 2019-04-03: 0.2 mg via ORAL
  Filled 2019-04-03: qty 1

## 2019-04-03 MED ORDER — METOPROLOL TARTRATE 25 MG PO TABS: 75.0000 mg | ORAL_TABLET | Freq: Once | ORAL | Status: DC

## 2019-04-03 MED ORDER — HYDRALAZINE HCL 25 MG PO TABS: 50.0000 mg | ORAL_TABLET | Freq: Once | ORAL | Status: DC

## 2019-04-03 MED ORDER — METOPROLOL SUCCINATE ER 25 MG PO TB24
75.0000 mg | ORAL_TABLET | Freq: Every day | ORAL | Status: DC
  Administered 2019-04-03: 75 mg via ORAL
  Filled 2019-04-03: qty 3

## 2019-04-03 NOTE — ED Provider Notes (Addendum)
[facility] EMERGENCY DEPARTMENT Provider Note   CSN: [id number] Arrival date & time: 04/03/19  1331     History Chief Complaint  Patient presents with  . Hypertension    Muskaan Wammack Carlucci is a 73 y.o. female with history of hypertension and SVT who presents with elevated blood pressure.  She states that she was admitted for SVT on November 21.  She was observed overnight and her blood pressure medicines were changed.  Since she was discharged her blood pressures have been running very high ~200/100.  She followed up with her primary care provider and they started her back on metoprolol 40 mg twice daily.  Her blood pressures have continued to be elevated and so the increased her metoprolol to 75 mg twice daily and she also takes spironolactone.  She has been having some mild dizziness when she leans over but nothing persistent or when she is walking around.  She also has been having mild headaches but also denies any pain currently.  No chest pain or shortness of breath.  Blood pressure in triage was 108/80.  HPI     Past Medical History:  Diagnosis Date  . Adenomatous colon polyp   . Asthma   . Benign gastric polyp - hyperplastic 09/2017  . Chronic low back pain   . Diverticulosis 01/23/2010   left colon  . Heart murmur 2016  . Hypertension   . Hypothyroidism   . IBS (irritable bowel syndrome)     Patient Active Problem List   Diagnosis Date Noted  . Supraventricular tachycardia ([place]) 03/13/2019  . Ascending aortic aneurysm ([place]) 06/24/2018  . 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 Irritable bowel syndrome 09/18/2017  . Internal and external prolapsed hemorrhoids 01/20/2013  . Impaired glucose tolerance 02/09/2012  . LOW BACK PAIN 12/17/2009  . Asthma 08/16/2009  . Hypothyroidism 11/03/2006  . Essential hypertension 11/03/2006  . MENOPAUSAL SYNDROME 11/03/2006  . History of colonic polyps 11/03/2006     Past Surgical History:  Procedure Laterality Date  . ABDOMINAL HYSTERECTOMY    . COLONOSCOPY W/ BIOPSIES  multiple  . HEMICOLECTOMY  02/15/10   right, tubulovillous adenoma appendix, Dr. Georgette Dover  . HEMORRHOID BANDING  2014  . LUMBAR EPIDURAL INJECTION  2011   x 2  . MELANOMA EXCISION Left 2012   left arm, not sure about lymph nodes  . REPEAT CESAREAN SECTION     3 in all, last 1973     OB History   No obstetric history on file.     Family History  Problem Relation Age of Onset  . Lung cancer Father   . Heart disease Father   . Colon cancer Mother   . Colon cancer Maternal Uncle   . Colon cancer Maternal Uncle     Social History   Tobacco Use  . Smoking status: Never Smoker  . Smokeless tobacco: Never Used  Substance Use Topics  . Alcohol use: No  . Drug use: No    Home Medications Prior to Admission medications   Medication Sig Start Date End Date Taking? Authorizing Provider  albuterol (VENTOLIN HFA) 108 (90 Base) MCG/ACT inhaler INHALE 2 PUFFS EVERY SIX HOURS AS NEEDED FOR WHEEZING OR SHORTNESS OF BREATH Patient taking differently: Inhale 2 puffs into the lungs every 6 (six) hours as needed for wheezing or shortness of breath.  02/02/19   Nafziger, Tommi Rumps, NP  Ascorbic Acid (VITAMIN C) 500 MG tablet daily. Take 2 tablet  daily     [provider]  aspirin 81 MG EC tablet Take 1 tablet (81 mg total) by mouth daily with breakfast. 03/14/19   Denton Brick, Courage, MD  Calcium Carbonate-Vitamin D (CALCIUM-VITAMIN D) 500-200 MG-UNIT per tablet Take 1 tablet by mouth 2 (two) times daily with a meal.      [provider]  cetirizine (ZYRTEC) 10 MG tablet Take 10 mg by mouth daily.    [provider]  Cranberry 1000 MG CAPS Take by mouth. Take 2 tablets daily     [provider]  fish oil-omega-3 fatty acids 1000 MG capsule Take 1 g by mouth every evening.     [provider]  fluticasone (FLONASE) 50 MCG/ACT nasal spray Place 2  sprays into both nostrils daily. 11/11/18   Nafziger, Tommi Rumps, NP  metoprolol succinate (TOPROL-XL) 50 MG 24 hr tablet Take 1 tablet (50 mg total) by mouth 2 (two) times daily. Take with or immediately following a meal. -May Take additional 1/2 Tablet (25 mg) as needed every 8 hrs for Fast Heart Rate and palpitations- 03/14/19   Roxan Hockey, MD  Multiple Vitamin (MULTIVITAMIN) tablet Take 1 tablet by mouth daily.      [provider]  pantoprazole (PROTONIX) 40 MG tablet Take 1 tablet (40 mg total) by mouth daily before breakfast. 03/14/19   Emokpae, Courage, MD  Peppermint Oil (IBGARD) 90 MG CPCR Take 2 capsules by mouth 2 (two) times daily.    [provider]  quinapril (ACCUPRIL) 40 MG tablet Take 1 tablet (40 mg total) by mouth daily. 03/14/19   Roxan Hockey, MD  spironolactone (ALDACTONE) 25 MG tablet Take 1 tablet (25 mg total) by mouth daily. 03/14/19   Roxan Hockey, MD  SYNTHROID 112 MCG tablet Take 1 tablet (112 mcg total) by mouth daily before breakfast. 07/12/18   Nafziger, Tommi Rumps, NP  vitamin B-12 (CYANOCOBALAMIN) 500 MCG tablet Take 500 mcg by mouth daily.      [provider]    Allergies    Dicyclomine, Nifedipine, Amlodipine, and Theophylline  Review of Systems   Review of Systems  Respiratory: Negative for shortness of breath.   Cardiovascular: Negative for chest pain.  Gastrointestinal: Negative for abdominal pain.  Neurological: Negative for dizziness, syncope and headaches.    Physical Exam Updated Vital Signs BP 108/80 (BP Location: Right Arm)   Pulse 64   Temp 98.2 F (36.8 C)   Resp 14   Ht 5\' 2"  (1.575 m)   Wt 78.9 kg   SpO2 100%   BMI 31.83 kg/m   Physical Exam Vitals and nursing note reviewed.  Constitutional:      General: She is not in acute distress.    Appearance: Normal appearance. She is well-developed. She is not ill-appearing.     Comments: Calm and cooperative  HENT:     Head: Normocephalic and atraumatic.    Eyes:     General: No scleral icterus.       Right eye: No discharge.        Left eye: No discharge.     Conjunctiva/sclera: Conjunctivae normal.     Pupils: Pupils are equal, round, and reactive to light.  Cardiovascular:     Rate and Rhythm: Normal rate and regular rhythm.  Pulmonary:     Effort: Pulmonary effort is normal. No respiratory distress.     Breath sounds: Normal breath sounds.  Abdominal:     General: There is no distension.  Palpations: Abdomen is soft.     Tenderness: There is no abdominal tenderness.  Musculoskeletal:     Cervical back: Normal range of motion.  Skin:    General: Skin is warm and dry.  Neurological:     Mental Status: She is alert and oriented to person, place, and time.  Psychiatric:        Behavior: Behavior normal.     ED Results / Procedures / Treatments   Labs (all labs ordered are listed, but only abnormal results are displayed) Labs Reviewed - No data to display  EKG None  Radiology No results found.  Procedures Procedures (including critical care time)  Medications Ordered in ED Medications - No data to display  ED Course  I have reviewed the triage vital signs and the nursing notes.  Pertinent labs & imaging results that were available during my care of the patient were reviewed by me and considered in my medical decision making (see chart for details).  73 year old female presents with elevated BP at home. She has asymptomatic HTN. BP was normal in triage however I think this was an incorrect reading. Repeat once she was roomed was 220/97 which is closer to what her readings at home have been. Discussed with Dr. Lita Mains. Will give dose of Clonidine and her home meds that are due and recheck BP.  BP is improving and in the ~130/80. Will d/c. She will call her PCP tomorrow. Return precautions discussed.  MDM Rules/Calculators/A&P  Final Clinical Impression(s) / ED Diagnoses Final diagnoses:  Hypertension,  unspecified type    Rx / DC Orders ED Discharge Orders    None       Recardo Evangelist, PA-C 04/03/19 2023    Recardo Evangelist, PA-C 04/03/19 2023    Julianne Rice, MD 04/11/19 [phone]

## 2019-04-03 NOTE — ED Triage Notes (Signed)
Reports BP elevated over the last few days.  States it was 161/104 this morning.  States she is stressed out about checking account being hacked into.  BP normal at this time.  No other complaints.

## 2019-04-03 NOTE — Discharge Instructions (Signed)
Please take your quinipril tonight and follow up with your doctor tomorrow Return if worsening

## 2019-04-04 ENCOUNTER — Encounter: Payer: Self-pay | Admitting: Adult Health

## 2019-04-04 ENCOUNTER — Telehealth (INDEPENDENT_AMBULATORY_CARE_PROVIDER_SITE_OTHER): Payer: Medicare HMO | Admitting: Adult Health

## 2019-04-04 ENCOUNTER — Telehealth: Payer: Self-pay | Admitting: Cardiovascular Disease

## 2019-04-04 VITALS — BP 130/81 | HR 58 | Ht 62.0 in | Wt 174.0 lb

## 2019-04-04 DIAGNOSIS — I10 Essential (primary) hypertension: Secondary | ICD-10-CM | POA: Diagnosis not present

## 2019-04-04 DIAGNOSIS — I471 Supraventricular tachycardia: Secondary | ICD-10-CM | POA: Diagnosis not present

## 2019-04-04 DIAGNOSIS — I351 Nonrheumatic aortic (valve) insufficiency: Secondary | ICD-10-CM

## 2019-04-04 DIAGNOSIS — I712 Thoracic aortic aneurysm, without rupture: Secondary | ICD-10-CM

## 2019-04-04 DIAGNOSIS — I7121 Aneurysm of the ascending aorta, without rupture: Secondary | ICD-10-CM

## 2019-04-04 NOTE — Patient Instructions (Addendum)
Medication Instructions:  Continue current medications  If you need a refill on your cardiac medications before your next appointment, please call your pharmacy.  Labwork: None Ordered   Testing/Procedures: None Ordered  Follow-Up: Keep appointment with Dr Sallyanne Kuster 12/24 @ 9:40 am  At [facility], you and your health needs are our priority.  As part of our continuing mission to provide you with exceptional heart care, we have created designated Provider Care Teams.  These Care Teams include your primary Cardiologist (physician) and Advanced Practice Providers (APPs -  Physician Assistants and Nurse Practitioners) who all work together to provide you with the care you need, when you need it.  Thank you for choosing [facility] at [facility]!!       Happy Holidays!!
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
## 2019-04-04 NOTE — Telephone Encounter (Signed)
New Message  Pt c/o BP issue: STAT if pt c/o blurred vision, one-sided weakness or slurred speech  1. What are your last 5 BP readings? 198/97, 222/100, 136/88 (after ER visit last night), 162/105, 162/98  2. Are you having any other symptoms (ex. Dizziness, headache, blurred vision, passed out)? Dizziness when bending over, headache, anxiousness, nervousness,  3. What is your BP issue? BP elevated and medicine not working to control it.

## 2019-04-04 NOTE — Telephone Encounter (Signed)
Pt called to report that she was in the Bp last night with a very high BP... she says she has been struggling with her BP for several weeks..she has had multiple med changes with her [facility] and from a hosp visit 03/12/19.   She says she has been feeling well other than being very anxious about her BP.Marland Kitchen  When her BP is elevated she feels "nervous". She feels too many people have been making changes in her meds and only wants cardiology to start managing better for her.   She agreed to a virtual visit today.. she will check her BP before her visit with Jory Sims NP at 1:15pm after she sits and rests for about 20-30 minutes. She has had a lot of stress and will try to relax.   She will also have her meds ready for review.   Pt had labs last week with her PCP.Harless Litten NP.      Virtual Visit Pre-Appointment Phone Call  "(Name), I am calling you today to discuss your upcoming appointment. We are currently trying to limit exposure to the virus that causes COVID-19 by seeing patients at home rather than in the office."  1. "What is the BEST phone number to call the day of the visit?" - include this in appointment notes  2. "Do you have or have access to (through a family member/friend) a smartphone with video capability that we can use for your visit?" a. If yes - list this number in appt notes as "cell" (if different from BEST phone #) and list the appointment type as a VIDEO visit in appointment notes b. If no - list the appointment type as a PHONE visit in appointment notes  3. Confirm consent - "In the setting of the current Covid19 crisis, you are scheduled for a (phone or video) visit with your provider on (date) at (time).  Just as we do with many in-office visits, in order for you to participate in this visit, we must obtain consent.  If you'd like, I can send this to your mychart (if signed up) or email for you to review.  Otherwise, I can obtain your verbal  consent now.  All virtual visits are billed to your insurance company just like a normal visit would be.  By agreeing to a virtual visit, we'd like you to understand that the technology does not allow for your provider to perform an examination, and thus may limit your provider's ability to fully assess your condition. If your provider identifies any concerns that need to be evaluated in person, we will make arrangements to do so.  Finally, though the technology is pretty good, we cannot assure that it will always work on either your or our end, and in the setting of a video visit, we may have to convert it to a phone-only visit.  In either situation, we cannot ensure that we have a secure connection.  Are you willing to proceed?" STAFF: Did the patient verbally acknowledge consent to telehealth visit? Document YES/NO here: YES  4. Advise patient to be prepared - "Two hours prior to your appointment, go ahead and check your blood pressure, pulse, oxygen saturation, and your weight (if you have the equipment to check those) and write them all down. When your visit starts, your provider will ask you for this information. If you have an Apple Watch or Kardia device, please plan to have heart rate information ready on the day of your  appointment. Please have a pen and paper handy nearby the day of the visit as well."  5. Give patient instructions for MyChart download to smartphone OR Doximity/Doxy.me as below if video visit (depending on what platform provider is using)  6. Inform patient they will receive a phone call 15 minutes prior to their appointment time (may be from unknown caller ID) so they should be prepared to answer    TELEPHONE CALL NOTE  Jasmine Wheeler has been deemed a candidate for a follow-up tele-health visit to limit community exposure during the Covid-19 pandemic. I spoke with the patient via phone to ensure availability of phone/video source, confirm preferred email & phone number,  and discuss instructions and expectations.  I reminded Jasmine Wheeler to be prepared with any vital sign and/or heart rhythm information that could potentially be obtained via home monitoring, at the time of her visit. I reminded Jasmine Wheeler to expect a phone call prior to her visit.  Stephani Police, RN 04/04/2019 9:46 AM   INSTRUCTIONS FOR DOWNLOADING THE MYCHART APP TO SMARTPHONE  - The patient must first make sure to have activated MyChart and know their login information - If Apple, go to [employer] and type in MyChart in the search bar and download the app. If Android, ask patient to go to [employer] and type in Santa [place] in the search bar and download the app. The app is free but as with any other app downloads, their phone may require them to verify saved payment information or Apple/Android password.  - The patient will need to then log into the app with their MyChart username and password, and select [facility] as their healthcare provider to link the account. When it is time for your visit, go to the MyChart app, find appointments, and click Begin Video Visit. Be sure to Select Allow for your device to access the Microphone and Camera for your visit. You will then be connected, and your provider will be with you shortly.  **If they have any issues connecting, or need assistance please contact MyChart service desk [phone])**  **If using a computer, in order to ensure the best quality for their visit they will need to use either of the following Internet Browsers: [employer], or Google Chrome**  IF USING DOXIMITY or DOXY.ME - The patient will receive a link just prior to their visit by text.     FULL LENGTH CONSENT FOR TELE-HEALTH VISIT   I hereby voluntarily request, consent and authorize [facility] and its employed or contracted physicians, physician assistants, nurse practitioners or other licensed health care professionals (the  Practitioner), to provide me with telemedicine health care services (the "Services") as deemed necessary by the treating Practitioner. I acknowledge and consent to receive the Services by the Practitioner via telemedicine. I understand that the telemedicine visit will involve communicating with the Practitioner through live audiovisual communication technology and the disclosure of certain medical information by electronic transmission. I acknowledge that I have been given the opportunity to request an in-person assessment or other available alternative prior to the telemedicine visit and am voluntarily participating in the telemedicine visit.  I understand that I have the right to withhold or withdraw my consent to the use of telemedicine in the course of my care at any time, without affecting my right to future care or treatment, and that the Practitioner or I may terminate the telemedicine visit at any time. I understand that I have the  right to inspect all information obtained and/or recorded in the course of the telemedicine visit and may receive copies of available information for a reasonable fee.  I understand that some of the potential risks of receiving the Services via telemedicine include:  Marland Kitchen Delay or interruption in medical evaluation due to technological equipment failure or disruption; . Information transmitted may not be sufficient (e.g. poor resolution of images) to allow for appropriate medical decision making by the Practitioner; and/or  . In rare instances, security protocols could fail, causing a breach of personal health information.  Furthermore, I acknowledge that it is my responsibility to provide information about my medical history, conditions and care that is complete and accurate to the best of my ability. I acknowledge that Practitioner's advice, recommendations, and/or decision may be based on factors not within their control, such as incomplete or inaccurate data provided by me  or distortions of diagnostic images or specimens that may result from electronic transmissions. I understand that the practice of medicine is not an exact science and that Practitioner makes no warranties or guarantees regarding treatment outcomes. I acknowledge that I will receive a copy of this consent concurrently upon execution via email to the email address I last provided but may also request a printed copy by calling the office of [facility].    I understand that my insurance will be billed for this visit.   I have read or had this consent read to me. . I understand the contents of this consent, which adequately explains the benefits and risks of the Services being provided via telemedicine.  . I have been provided ample opportunity to ask questions regarding this consent and the Services and have had my questions answered to my satisfaction. . I give my informed consent for the services to be provided through the use of telemedicine in my medical care  By participating in this telemedicine visit I agree to the above.

## 2019-04-04 NOTE — Progress Notes (Signed)
Thanks

## 2019-04-08 ENCOUNTER — Telehealth: Payer: Self-pay | Admitting: Cardiovascular Disease

## 2019-04-08 MED ORDER — TRIAMTERENE-HCTZ 37.5-25 MG PO TABS: 0.5000 | ORAL_TABLET | Freq: Every day | ORAL | 3 refills | Status: DC

## 2019-04-08 NOTE — Telephone Encounter (Signed)
Pt c/o BP issue: STAT if pt c/o blurred vision, one-sided weakness or slurred speech  1. What are your last 5 BP readings? 171/101 left arm      197/96 right arm      168/107 when she first got up this morning left arm   2. Are you having any other symptoms (ex. Dizziness, headache, blurred vision, passed out)? Dizzy when she bends over, headache comes and goes.   3. What is your BP issue? High BP, over the last week or two.

## 2019-04-08 NOTE — Telephone Encounter (Signed)
The patient has been made aware to start triamterene-hydrochlorothiazide 37.5-25 (Maxzide) half tablet once daily. She will monitor her blood pressure and call back if anything further is needed. She has a follow up on 04/14/2019.

## 2019-04-08 NOTE — Telephone Encounter (Signed)
Her hydrochlorothiazide was stopped at the most recent hospital visit due to low potassium levels. Would like her to start on a combination of treatment with Dyazide (half of a 37.5-25 tablet), which hopefully will keep her potassium in normal range.  It will take 10-14 days to see the impact on her blood pressure.  Target BP less than 140/90.

## 2019-04-13 ENCOUNTER — Other Ambulatory Visit: Payer: Self-pay

## 2019-04-14 ENCOUNTER — Ambulatory Visit (INDEPENDENT_AMBULATORY_CARE_PROVIDER_SITE_OTHER): Payer: Medicare HMO | Admitting: Cardiovascular Disease

## 2019-04-14 ENCOUNTER — Encounter: Payer: Self-pay | Admitting: Cardiovascular Disease

## 2019-04-14 ENCOUNTER — Other Ambulatory Visit (INDEPENDENT_AMBULATORY_CARE_PROVIDER_SITE_OTHER): Payer: Medicare HMO

## 2019-04-14 VITALS — BP 138/88 | HR 67 | Ht 62.0 in | Wt 172.6 lb

## 2019-04-14 DIAGNOSIS — I471 Supraventricular tachycardia: Secondary | ICD-10-CM

## 2019-04-14 DIAGNOSIS — N179 Acute kidney failure, unspecified: Secondary | ICD-10-CM | POA: Diagnosis not present

## 2019-04-14 DIAGNOSIS — I7121 Aneurysm of the ascending aorta, without rupture: Secondary | ICD-10-CM

## 2019-04-14 DIAGNOSIS — I453 Trifascicular block: Secondary | ICD-10-CM

## 2019-04-14 DIAGNOSIS — I10 Essential (primary) hypertension: Secondary | ICD-10-CM

## 2019-04-14 DIAGNOSIS — I712 Thoracic aortic aneurysm, without rupture: Secondary | ICD-10-CM | POA: Diagnosis not present

## 2019-04-14 DIAGNOSIS — I351 Nonrheumatic aortic (valve) insufficiency: Secondary | ICD-10-CM | POA: Diagnosis not present

## 2019-04-14 LAB — BASIC METABOLIC PANEL
BUN: 26 mg/dL — ABNORMAL HIGH (ref 6–23)
CO2: 31 mEq/L (ref 19–32)
Calcium: 9.6 mg/dL (ref 8.4–10.5)
Chloride: 99 mEq/L (ref 96–112)
Creatinine, Ser: 1.25 mg/dL — ABNORMAL HIGH (ref 0.40–1.20)
GFR: 41.68 mL/min — ABNORMAL LOW (ref >60.00)
Glucose, Bld: 92 mg/dL (ref 70–99)
Potassium: 4.4 mEq/L (ref 3.5–5.1)
Sodium: 136 mEq/L (ref 135–145)

## 2019-04-14 NOTE — Progress Notes (Signed)
Cardiology office note:    Date:  04/14/2019   ID:  Unkown, Tedford 05/31/1945, MRN [id number]  PCP:  Dorothyann Peng, NP  Cardiologist:  Sanda Klein, MD  Electrophysiologist:  None   Referring MD: Stevie Kern, MD  Chief Complaint  Patient presents with  . Irregular Heart Beat     History of Present Illness:    Jasmine Wheeler is a 73 y.o. 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  Arrhythmia did not respond to intravenous adenosine, converted about 30 minutes after initiation of a diltiazem drip.  The ECG shows a narrow complex SVT strongly suggestive of AV node reentry  She has had a few brief episodes of palpitations that scared her, but did not really lead to sustained tachycardia.  She was having problems with headaches and dizziness with markedly elevated systolic and diastolic blood pressure, but these have improved after medication adjustment.  Her blood pressure is still trending downward and is now in the high 130s/high 80s.  Heart rate is in the high 50s at rest.  She has lost about 6-7 pounds since her last office appointment.  She is trying to avoid caffeinated beverages and is still trying to lose more weight.  She had chest tightness during the episode of tachycardia, but otherwise denies chest discomfort either at rest or with activity.  She has not had issues of dyspnea, orthopnea, PND, edema, syncope, claudication or focal neurological complaints.  She has a history of dysphagia and has previously undergone esophagoplasty and is on chronic treatment with proton pump inhibitors.  Her lumbar spine problems have improved and she decided not to have surgery at this point.  Because of a heart murmur she underwent an echocardiogram in 2016.  The echo showed normal  left ventricular systolic function with an ejection fraction of 60-65%, left ventricular hypertrophy with mild diastolic dysfunction, a dilated ascending aorta of 44 mm with trivial aortic valve insufficiency.  A follow-up echo that we performed in 2019 shows no significant interval change. .  Past Medical History:  Diagnosis Date  . Adenomatous colon polyp   . Asthma   . Benign gastric polyp - hyperplastic 09/2017  . Chronic low back pain   . Diverticulosis 01/23/2010   left colon  . Heart murmur 2016  . Hypertension   . Hypothyroidism   . IBS (irritable bowel syndrome)     Past Surgical History:  Procedure Laterality Date  . ABDOMINAL HYSTERECTOMY    . COLONOSCOPY W/ BIOPSIES  multiple  . HEMICOLECTOMY  02/15/10   right, tubulovillous adenoma appendix, Dr. Georgette Dover  . HEMORRHOID BANDING  2014  . LUMBAR EPIDURAL INJECTION  2011   x 2  . MELANOMA EXCISION Left 2012   left arm, not sure about lymph nodes  . REPEAT CESAREAN SECTION     3 in all, last 1973    Current Medications: Current Meds  Medication Sig  . albuterol (VENTOLIN HFA) 108 (90 Base) MCG/ACT inhaler INHALE 2 PUFFS EVERY SIX HOURS AS NEEDED FOR WHEEZING OR SHORTNESS OF BREATH (Patient taking differently: Inhale 2 puffs into the lungs every 6 (six) hours as needed for wheezing or shortness of breath. )  . Ascorbic Acid (VITAMIN C) 500 MG tablet daily. Take 2 tablet daily   . aspirin 81 MG EC tablet Take 1 tablet (81 mg  total) by mouth daily with breakfast.  . Calcium Carbonate-Vitamin D (CALCIUM-VITAMIN D) 500-200 MG-UNIT per tablet Take 1 tablet by mouth 2 (two) times daily with a meal.    . cetirizine (ZYRTEC) 10 MG tablet Take 10 mg by mouth daily.  . Cranberry 1000 MG CAPS Take by mouth. Take 2 tablets daily   . fish oil-omega-3 fatty acids 1000 MG capsule Take 1 g by mouth every evening.   . fluticasone (FLONASE) 50 MCG/ACT nasal spray Place 2 sprays into both nostrils daily.  . metoprolol succinate (TOPROL-XL)  50 MG 24 hr tablet Take 75 mg by mouth 2 (two) times daily. Take with or immediately following a meal.  . Multiple Vitamin (MULTIVITAMIN) tablet Take 1 tablet by mouth daily.    . pantoprazole (PROTONIX) 40 MG tablet Take 1 tablet (40 mg total) by mouth daily before breakfast.  . Peppermint Oil (IBGARD) 90 MG CPCR Take 2 capsules by mouth 2 (two) times daily.  . quinapril (ACCUPRIL) 40 MG tablet Take 40 mg by mouth 2 (two) times daily.  Marland Kitchen spironolactone (ALDACTONE) 25 MG tablet Take 1 tablet (25 mg total) by mouth daily.  Marland Kitchen SYNTHROID 112 MCG tablet Take 1 tablet (112 mcg total) by mouth daily before breakfast.  . triamterene-hydrochlorothiazide (MAXZIDE-25) 37.5-25 MG tablet Take 0.5 tablets by mouth daily.  . vitamin B-12 (CYANOCOBALAMIN) 500 MCG tablet Take 500 mcg by mouth daily.       Allergies:   Dicyclomine, Nifedipine, Amlodipine, and Theophylline   Social History   Socioeconomic History  . Marital status: Married    Spouse name: Not on file  . Number of children: 3  . Years of education: Not on file  . Highest education level: Not on file  Occupational History  . 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MGR    Employer: [employer]  Tobacco Use  . Smoking status: Never Smoker  . Smokeless tobacco: Never Used  Substance and Sexual Activity  . Alcohol use: No  . Drug use: No  . Sexual activity: Not on file  Other Topics Concern  . Not on file  Social History Narrative   Married retired lives in [place]   No alcohol tobacco or drug use   Caffeine is 2 cups of coffee daily   3 children   Elderly mother lives with her and requires some care    Social Determinants of Health   Financial Resource Strain:   . Difficulty of Paying Living Expenses: Not on file  Food Insecurity:   . Worried About Charity fundraiser in the Last Year: Not on file  . Ran Out of Food in the Last Year: Not on file  Transportation Needs:   . Lack of Transportation (Medical): Not on file  . Lack of  Transportation (Non-Medical): Not on file  Physical Activity:   . Days of Exercise per Week: Not on file  . Minutes of Exercise per Session: Not on file  Stress:   . Feeling of Stress : Not on file  Social Connections:   . Frequency of Communication with Friends and Family: Not on file  . Frequency of Social Gatherings with Friends and Family: Not on file  . Attends Religious Services: Not on file  . Active Member of Clubs or Organizations: Not on file  . Attends Archivist Meetings: Not on file  . Marital Status: Not on file     Family History: The patient's family history includes Colon cancer in her maternal uncle, maternal uncle,  and mother; Heart disease in her father; Lung cancer in her father.  ROS:   Please see the history of present illness.     All other systems reviewed and are negative.  EKGs/Labs/Other Studies Reviewed:    The following studies were reviewed today:   ECHO 03/13/2019:    Result status: Final result                    1. Left ventricular ejection fraction, by visual estimation, is 60 to 65%. The left ventricle has normal function. There is moderately increased left ventricular hypertrophy.  2. Left ventricular diastolic parameters are consistent with Grade I diastolic dysfunction (impaired relaxation).  3. Global right ventricle has normal systolic function.The right ventricular size is normal. No increase in right ventricular wall thickness.  4. Left atrial size was mildly dilated.  5. Right atrial size was normal.  6. Mild aortic valve annular calcification.  7. The mitral valve is normal in structure. Trace mitral valve regurgitation. No evidence of mitral stenosis.  8. The tricuspid valve is normal in structure. Tricuspid valve regurgitation is trivial.  9. The aortic valve is tricuspid. Aortic valve regurgitation is mild. No evidence of aortic valve sclerosis or stenosis. 10. There is Mild calcification of the aortic valve. 11.  There is Mild thickening of the aortic valve. 12. The pulmonic valve was not well visualized. Pulmonic valve regurgitation is not visualized. 13. There is mild dilatation of the ascending aorta measuring 39 mm. 14. Mildly elevated pulmonary artery systolic pressure. 15. The inferior vena cava is normal in size with greater than 50% respiratory variability, suggesting right atrial pressure of 3 mmHg.    EKG:  EKG is not ordered today.  The tracings from 03/12/2019 showed narrow complex tachycardia in the 150s with pseudo-r' in V1 highly suggestive of AV node reentry. Recent Labs: 03/12/2019: Magnesium 1.9 03/13/2019: ALT 16; TSH 1.332 03/30/2019: Hemoglobin 11.5; Platelets 216.0 04/14/2019: BUN 26; Creatinine, Ser 1.25; Potassium 4.4; Sodium 136  Recent Lipid Panel    Component Value Date/Time   CHOL 202 (H) 05/06/2018 0954   TRIG 147.0 05/06/2018 0954   TRIG 59 02/12/2006 1105   HDL 43.90 05/06/2018 0954   CHOLHDL 5 05/06/2018 0954   VLDL 29.4 05/06/2018 0954   LDLCALC 128 (H) 05/06/2018 0954    Physical Exam:    VS:  BP 138/88   Pulse 67   Ht 5\' 2"  (1.575 m)   Wt 172 lb 9.6 oz (78.3 kg)   SpO2 96%   BMI 31.57 kg/m     Wt Readings from Last 3 Encounters:  04/14/19 172 lb 9.6 oz (78.3 kg)  04/04/19 174 lb (78.9 kg)  04/03/19 174 lb (78.9 kg)     General: Alert, oriented x3, no distress, borderline obese Head: no evidence of trauma, PERRL, EOMI, no exophtalmos or lid lag, no myxedema, no xanthelasma; normal ears, nose and oropharynx Neck: normal jugular venous pulsations and no hepatojugular reflux; brisk carotid pulses without delay and no carotid bruits Chest: clear to auscultation, no signs of consolidation by percussion or palpation, normal fremitus, symmetrical and full respiratory excursions Cardiovascular: normal position and quality of the apical impulse, regular rhythm, normal first and second heart sounds, 1/6 decrescendo murmur at the left lower sternal border, no  systolic murmurs, rubs or gallops Abdomen: no tenderness or distention, no masses by palpation, no abnormal pulsatility or arterial bruits, normal bowel sounds, no hepatosplenomegaly Extremities: no clubbing, cyanosis or edema; 2+ radial, ulnar  and brachial pulses bilaterally; 2+ right femoral, posterior tibial and dorsalis pedis pulses; 2+ left femoral, posterior tibial and dorsalis pedis pulses; no subclavian or femoral bruits Neurological: grossly nonfocal Psych: Normal mood and affect   ASSESSMENT:    1. AVNRT (AV nodal re-entry tachycardia) ([place])   2. Nonrheumatic aortic valve insufficiency   3. Ascending aortic aneurysm ([place])   4. Trifascicular block   5. Essential hypertension    PLAN:    In order of problems listed above:  1. AVNRT: A dose of beta-blocker has been increased, but use of AV nodal blocking agents is limited by pre-existing trifascicular block.  So far no evidence of significant bradycardia or high-grade AV block.  Reviewed the use of Valsalva maneuver and or diving reflex.  Can take an extra 25 mg of metoprolol for episodes that did not respond to these interventions.  May require AV nodal and slow pathway ablation and/or pacemaker implantation in the future.  2. AI: Mild by repeat echocardiogram just a couple of weeks ago.  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 3. AscAoAneur: No progression over several years  4. 1st deg AVB, RBBB+LAFB/LPFB: Avoid combination beta-blocker and centrally acting calcium channel blocker, which caused symptoms of bradycardia in the past.  Monitor for symptoms of advanced heart block (asked her to promptly report extreme fatigue, syncope or dizziness that is not related to changes in position). She clearly has some intrinsic intraventricular conduction disease, but does not require pacemaker at this time.  5. HTN: It is critical to maintain normal blood pressure to avoid enlargement of her aortic aneurysm and worsening of aortic insufficiency and  left ventricular hypertrophy.  Steadily improving on multiple medications, still too soon to adjust medications any further.   Medication Adjustments/Labs and Tests Ordered: Current medicines are reviewed at length with the patient today.  Concerns regarding medicines are outlined above.  No orders of the defined types were placed in this encounter.  Meds ordered this encounter  Medications                 . amLODipine (NORVASC) 5 MG tablet    Sig: Take 1 tablet (5 mg total) by mouth daily.    Dispense:  90 tablet    Refill:  1    Patient Instructions  Medication Instructions:  NO CHANGES *If you need a refill on your cardiac medications before your next appointment, please call your pharmacy*  Lab Work: NONE NEEDED If you have labs (blood work) drawn today and your tests are completely normal, you will receive your results only by: Marland Kitchen MyChart Message (if you have MyChart) OR . A paper copy in the mail If you have any lab test that is abnormal or we need to change your treatment, we will call you to review the results.  Testing/Procedures: NONE NEEDED  Follow-Up: At [facility], you and your health needs are our priority.  As part of our continuing mission to provide you with exceptional heart care, we have created designated Provider Care Teams.  These Care Teams include your primary Cardiologist (physician) and Advanced Practice Providers (APPs -  Physician Assistants and Nurse Practitioners) who all work together to provide you with the care you need, when you need it.  Your next appointment:   6 month(s)  The format for your next appointment:   In Person  Provider:   Sanda Klein, MD      Signed, Sanda Klein, MD  04/14/2019 4:13 PM    [facility]

## 2019-04-14 NOTE — Patient Instructions (Signed)
Medication Instructions:  NO CHANGES *If you need a refill on your cardiac medications before your next appointment, please call your pharmacy*  Lab Work: NONE NEEDED If you have labs (blood work) drawn today and your tests are completely normal, you will receive your results only by: Marland Kitchen MyChart Message (if you have MyChart) OR . A paper copy in the mail If you have any lab test that is abnormal or we need to change your treatment, we will call you to review the results.  Testing/Procedures: NONE NEEDED  Follow-Up: At [facility], you and your health needs are our priority.  As part of our continuing mission to provide you with exceptional heart care, we have created designated Provider Care Teams.  These Care Teams include your primary Cardiologist (physician) and Advanced Practice Providers (APPs -  Physician Assistants and Nurse Practitioners) who all work together to provide you with the care you need, when you need it.  Your next appointment:   6 month(s)  The format for your next appointment:   In Person  Provider:   Sanda Klein, MD

## 2019-04-27 ENCOUNTER — Encounter: Payer: Self-pay | Admitting: Adult Health

## 2019-04-29 MED ORDER — SPIRONOLACTONE 25 MG PO TABS: 25.0000 mg | ORAL_TABLET | Freq: Every day | ORAL | 3 refills | Status: DC

## 2019-04-29 MED ORDER — QUINAPRIL HCL 40 MG PO TABS: 40.0000 mg | ORAL_TABLET | Freq: Two times a day (BID) | ORAL | 3 refills | Status: DC

## 2019-04-29 MED ORDER — METOPROLOL SUCCINATE ER 50 MG PO TB24: ORAL_TABLET | ORAL | 3 refills | Status: DC

## 2019-05-05 ENCOUNTER — Other Ambulatory Visit: Payer: Self-pay

## 2019-05-06 ENCOUNTER — Other Ambulatory Visit: Payer: Self-pay

## 2019-05-11 ENCOUNTER — Other Ambulatory Visit: Payer: Self-pay

## 2019-05-11 ENCOUNTER — Ambulatory Visit (INDEPENDENT_AMBULATORY_CARE_PROVIDER_SITE_OTHER): Payer: Medicare HMO | Admitting: Adult Health

## 2019-05-11 ENCOUNTER — Telehealth: Payer: Self-pay | Admitting: Cardiovascular Disease

## 2019-05-11 ENCOUNTER — Encounter: Payer: Self-pay | Admitting: Adult Health

## 2019-05-11 VITALS — BP 130/80 | HR 55 | Temp 97.7°F | Ht 62.0 in | Wt 173.0 lb

## 2019-05-11 DIAGNOSIS — I10 Essential (primary) hypertension: Secondary | ICD-10-CM | POA: Diagnosis not present

## 2019-05-11 DIAGNOSIS — E782 Mixed hyperlipidemia: Secondary | ICD-10-CM

## 2019-05-11 DIAGNOSIS — K219 Gastro-esophageal reflux disease without esophagitis: Secondary | ICD-10-CM | POA: Diagnosis not present

## 2019-05-11 DIAGNOSIS — E039 Hypothyroidism, unspecified: Secondary | ICD-10-CM | POA: Diagnosis not present

## 2019-05-11 DIAGNOSIS — Z0000 Encounter for general adult medical examination without abnormal findings: Secondary | ICD-10-CM

## 2019-05-11 DIAGNOSIS — H8113 Benign paroxysmal vertigo, bilateral: Secondary | ICD-10-CM | POA: Diagnosis not present

## 2019-05-11 LAB — COMPREHENSIVE METABOLIC PANEL
ALT: 10 U/L (ref 0–35)
AST: 13 U/L (ref 0–37)
Albumin: 4.2 g/dL (ref 3.5–5.2)
Alkaline Phosphatase: 65 U/L (ref 39–117)
BUN: 31 mg/dL — ABNORMAL HIGH (ref 6–23)
CO2: 30 mEq/L (ref 19–32)
Calcium: 9.2 mg/dL (ref 8.4–10.5)
Chloride: 104 mEq/L (ref 96–112)
Creatinine, Ser: 1.35 mg/dL — ABNORMAL HIGH (ref 0.40–1.20)
GFR: 38.13 mL/min — ABNORMAL LOW (ref >60.00)
Glucose, Bld: 84 mg/dL (ref 70–99)
Potassium: 4.6 mEq/L (ref 3.5–5.1)
Sodium: 140 mEq/L (ref 135–145)
Total Bilirubin: 0.5 mg/dL (ref 0.2–1.2)
Total Protein: 6.2 g/dL (ref 6.0–8.3)

## 2019-05-11 LAB — LIPID PANEL
Cholesterol: 199 mg/dL (ref 0–200)
HDL: 44.9 mg/dL (ref >39.00)
LDL Cholesterol: 125 mg/dL — ABNORMAL HIGH (ref 0–99)
NonHDL: 153.76
Total CHOL/HDL Ratio: 4
Triglycerides: 146 mg/dL (ref 0.0–149.0)
VLDL: 29.2 mg/dL (ref 0.0–40.0)

## 2019-05-11 LAB — CBC WITH DIFFERENTIAL/PLATELET
Basophils Absolute: 0 10*3/uL (ref 0.0–0.1)
Basophils Relative: 0.5 % (ref 0.0–3.0)
Eosinophils Absolute: 0.1 10*3/uL (ref 0.0–0.7)
Eosinophils Relative: 3.1 % (ref 0.0–5.0)
HCT: 33.7 % — ABNORMAL LOW (ref 36.0–46.0)
Hemoglobin: 11.4 g/dL — ABNORMAL LOW (ref 12.0–15.0)
Lymphocytes Relative: 23.4 % (ref 12.0–46.0)
Lymphs Abs: 1 10*3/uL (ref 0.7–4.0)
MCHC: 33.7 g/dL (ref 30.0–36.0)
MCV: 90.6 fl (ref 78.0–100.0)
Monocytes Absolute: 0.3 10*3/uL (ref 0.1–1.0)
Monocytes Relative: 7.7 % (ref 3.0–12.0)
Neutro Abs: 2.9 10*3/uL (ref 1.4–7.7)
Neutrophils Relative %: 65.3 % (ref 43.0–77.0)
Platelets: 208 10*3/uL (ref 150.0–400.0)
RBC: 3.73 Mil/uL — ABNORMAL LOW (ref 3.87–5.11)
RDW: 13.3 % (ref 11.5–15.5)
WBC: 4.4 10*3/uL (ref 4.0–10.5)

## 2019-05-11 LAB — TSH: TSH: 0.19 u[IU]/mL — ABNORMAL LOW (ref 0.35–4.50)

## 2019-05-11 MED ORDER — LORAZEPAM 0.5 MG PO TABS: 0.5000 mg | ORAL_TABLET | Freq: Every day | ORAL | 0 refills | Status: DC | PRN

## 2019-05-11 NOTE — Telephone Encounter (Signed)
Spoke with pharmacist from [facility] and informed that MD is aware of interaction and monitoring pt's lab work.   Pharmacist report they have in their system pt  taking HCTZ. Informed the HCTZ was d/c on 11/23 and Maxzide was added on 12/18. [employer] will update account. Nurse called pt to confirm she is no longer taking HCTZ and only taking Maxzide.

## 2019-05-11 NOTE — Telephone Encounter (Signed)
Thanks, aware of the interaction, but we have had a very difficult time finding meds that she can tolerate for her BP. She is having labs today w PCP and will check K and renal function.

## 2019-05-11 NOTE — Progress Notes (Signed)
Subjective:    Patient ID: Jasmine Wheeler, female    DOB: December 27, 1945, 74 y.o.   MRN: [id number]  HPI  Patient presents for yearly preventative medicine examination. She is a pleasant 74 year old female who  has a past medical history of Adenomatous colon polyp, Asthma, Benign gastric polyp - hyperplastic (09/2017), Chronic low back pain, Diverticulosis (01/23/2010), Heart murmur (2016), Hypertension, Hypothyroidism, and IBS (irritable bowel syndrome).  Hypertension -currently prescribed quinapril, spironolactone, maxide and metoprolol.  Blood pressure management is now being done by Cardiology. Her home blood pressures have been in the 110-130's/70-80's.   BP Readings from Last 3 Encounters:  05/11/19 130/80  04/14/19 138/88  04/04/19 130/81   Hypothyroidism - takes synthroid 112 mcg  GERD - controlled with Protonix   H/o Aortic Aneurysm - stable over the last several years.   Hyperlipidemia - not currently on statin medication. Takes fish oil  The 10-year ASCVD risk score Mikey Bussing DC Jr., et al., 2013) is: 21.6%   Values used to calculate the score:     Age: 38 years     Sex: Female     Is Non-Hispanic African American: No     Diabetic: No     Tobacco smoker: No     Systolic Blood Pressure: [id number] mmHg     Is BP treated: Yes     HDL Cholesterol: 43.9 mg/dL     Total Cholesterol: 202 mg/dL  Lab Results  Component Value Date   CHOL 202 (H) 05/06/2018   HDL 43.90 05/06/2018   LDLCALC 128 (H) 05/06/2018   TRIG 147.0 05/06/2018   CHOLHDL 5 05/06/2018   Vertigo -This has been on again off again issue for many years.  Most recent episode started 2 to 3 weeks ago.  She becomes dizzy where she feels as though the world is spinning around her with changes in position such as sitting to laying down and bending over at the waist.  Symptoms last a few seconds and then resolved.  The past she has been treated with low-dose Ativan which has worked very well for her.  All immunizations  and health maintenance protocols were reviewed with the patient and needed orders were placed. She is up to date on routine vaccinations   Appropriate screening laboratory values were ordered for the patient including screening of hyperlipidemia, renal function and hepatic function.  Medication reconciliation,  past medical history, social history, problem list and allergies were reviewed in detail with the patient  Goals were established with regard to weight loss, exercise, and  diet in compliance with medications  End of life planning was discussed.  She is up to date on routine screening mammogram and colonoscopy.   Review of Systems  Constitutional: Negative.   HENT: Negative.   Eyes: Negative.   Respiratory: Negative.   Cardiovascular: Negative.   Gastrointestinal: Negative.   Endocrine: Negative.   Genitourinary: Negative.   Musculoskeletal: Negative.   Skin: Negative.   Allergic/Immunologic: Negative.   Neurological: Positive for dizziness.  Hematological: Negative.   Psychiatric/Behavioral: Negative.    Past Medical History:  Diagnosis Date  . Adenomatous colon polyp   . Asthma   . Benign gastric polyp - hyperplastic 09/2017  . Chronic low back pain   . Diverticulosis 01/23/2010   left colon  . Heart murmur 2016  . Hypertension   . Hypothyroidism   . IBS (irritable bowel syndrome)     Social History   Socioeconomic History  . Marital status:  Married    Spouse name: Not on file  . Number of children: 3  . Years of education: Not on file  . Highest education level: Not on file  Occupational History  . 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MGR    Employer: [employer]  Tobacco Use  . Smoking status: Never Smoker  . Smokeless tobacco: Never Used  Substance and Sexual Activity  . Alcohol use: No  . Drug use: No  . Sexual activity: Not on file  Other Topics Concern  . Not on file  Social History Narrative   Married retired lives in [place]   No alcohol tobacco or  drug use   Caffeine is 2 cups of coffee daily   3 children   Elderly mother lives with her and requires some care    Social Determinants of Health   Financial Resource Strain:   . Difficulty of Paying Living Expenses: Not on file  Food Insecurity:   . Worried About Charity fundraiser in the Last Year: Not on file  . Ran Out of Food in the Last Year: Not on file  Transportation Needs:   . Lack of Transportation (Medical): Not on file  . Lack of Transportation (Non-Medical): Not on file  Physical Activity:   . Days of Exercise per Week: Not on file  . Minutes of Exercise per Session: Not on file  Stress:   . Feeling of Stress : Not on file  Social Connections:   . Frequency of Communication with Friends and Family: Not on file  . Frequency of Social Gatherings with Friends and Family: Not on file  . Attends Religious Services: Not on file  . Active Member of Clubs or Organizations: Not on file  . Attends Archivist Meetings: Not on file  . Marital Status: Not on file  Intimate Partner Violence:   . Fear of Current or Ex-Partner: Not on file  . Emotionally Abused: Not on file  . Physically Abused: Not on file  . Sexually Abused: Not on file    Past Surgical History:  Procedure Laterality Date  . ABDOMINAL HYSTERECTOMY    . COLONOSCOPY W/ BIOPSIES  multiple  . HEMICOLECTOMY  02/15/10   right, tubulovillous adenoma appendix, Dr. Georgette Dover  . HEMORRHOID BANDING  2014  . LUMBAR EPIDURAL INJECTION  2011   x 2  . MELANOMA EXCISION Left 2012   left arm, not sure about lymph nodes  . REPEAT CESAREAN SECTION     3 in all, last 50    Family History  Problem Relation Age of Onset  . Lung cancer Father   . Heart disease Father   . Colon cancer Mother   . Colon cancer Maternal Uncle   . Colon cancer Maternal Uncle     Allergies  Allergen Reactions  . Dicyclomine Nausea Only and Other (See Comments)    Nausea, tingling in her whole body, couldn't sleep, made  lights bright  . Nifedipine Other (See Comments)    Dropped blood pressure and heart rate too much  . Amlodipine     Dizzy and feel like almost passing out  . Theophylline Other (See Comments)    Patient cannot recall reaction    Current Outpatient Medications on File Prior to Visit  Medication Sig Dispense Refill  . albuterol (VENTOLIN HFA) 108 (90 Base) MCG/ACT inhaler INHALE 2 PUFFS EVERY SIX HOURS AS NEEDED FOR WHEEZING OR SHORTNESS OF BREATH (Patient taking differently: Inhale 2 puffs into the lungs every 6 (  six) hours as needed for wheezing or shortness of breath. ) 54 g 3  . Ascorbic Acid (VITAMIN C) 500 MG tablet daily. Take 2 tablet daily     . aspirin 81 MG EC tablet Take 1 tablet (81 mg total) by mouth daily with breakfast. 30 tablet 12  . Calcium Carbonate-Vitamin D (CALCIUM-VITAMIN D) 500-200 MG-UNIT per tablet Take 1 tablet by mouth 2 (two) times daily with a meal.      . cetirizine (ZYRTEC) 10 MG tablet Take 10 mg by mouth daily.    . Cranberry 1000 MG CAPS Take by mouth. Take 2 tablets daily     . fish oil-omega-3 fatty acids 1000 MG capsule Take 1 g by mouth every evening.     . fluticasone (FLONASE) 50 MCG/ACT nasal spray Place 2 sprays into both nostrils daily. 48 g 1  . metoprolol succinate (TOPROL-XL) 50 MG 24 hr tablet Take 75mg  by mouth twice daily. Take with or immediately following a meal. 270 tablet 3  . Multiple Vitamin (MULTIVITAMIN) tablet Take 1 tablet by mouth daily.      . pantoprazole (PROTONIX) 40 MG tablet Take 1 tablet (40 mg total) by mouth daily before breakfast. 90 tablet 3  . Peppermint Oil (IBGARD) 90 MG CPCR Take 2 capsules by mouth 2 (two) times daily.    . quinapril (ACCUPRIL) 40 MG tablet Take 1 tablet (40 mg total) by mouth 2 (two) times daily. 180 tablet 3  . spironolactone (ALDACTONE) 25 MG tablet Take 1 tablet (25 mg total) by mouth daily. 90 tablet 3  . SYNTHROID 112 MCG tablet Take 1 tablet (112 mcg total) by mouth daily before breakfast. 90  tablet 3  . triamterene-hydrochlorothiazide (MAXZIDE-25) 37.5-25 MG tablet Take 0.5 tablets by mouth daily. 15 tablet 3  . vitamin B-12 (CYANOCOBALAMIN) 500 MCG tablet Take 500 mcg by mouth daily.       No current facility-administered medications on file prior to visit.    BP 130/80   Pulse (!) 55   Temp 97.7 F (36.5 C) (Other (Comment))   Ht 5\' 2"  (1.575 m)   Wt 173 lb (78.5 kg)   SpO2 97%   BMI 31.64 kg/m       Objective:   Physical Exam Vitals and nursing note reviewed.  Constitutional:      General: She is not in acute distress.    Appearance: She is not diaphoretic.  HENT:     Head: Normocephalic and atraumatic.     Right Ear: Tympanic membrane, ear canal and external ear normal. No middle ear effusion. There is no impacted cerumen.     Left Ear: Tympanic membrane, ear canal and external ear normal.  No middle ear effusion. There is no impacted cerumen.     Nose: Nose normal. No congestion or rhinorrhea.     Mouth/Throat:     Mouth: Mucous membranes are moist.     Pharynx: Oropharynx is clear. No oropharyngeal exudate.  Eyes:     General: No scleral icterus.       Right eye: No discharge.        Left eye: No discharge.     Extraocular Movements:     Right eye: Nystagmus present.     Left eye: Nystagmus present.     Conjunctiva/sclera: Conjunctivae normal.     Pupils: Pupils are equal, round, and reactive to light.  Neck:     Thyroid: No thyromegaly.     Vascular: No JVD.  Trachea: No tracheal deviation.  Cardiovascular:     Rate and Rhythm: Normal rate and regular rhythm.     Pulses: Normal pulses.     Heart sounds: Normal heart sounds. No murmur. No friction rub. No gallop.   Pulmonary:     Effort: Pulmonary effort is normal. No respiratory distress.     Breath sounds: Normal breath sounds. No stridor. No wheezing, rhonchi or rales.  Chest:     Chest wall: No tenderness.  Abdominal:     General: Bowel sounds are normal. There is no distension.      Palpations: Abdomen is soft. There is no mass.     Tenderness: There is no abdominal tenderness. There is no right CVA tenderness, left CVA tenderness, guarding or rebound.     Hernia: No hernia is present.  Musculoskeletal:        General: No swelling, tenderness, deformity or signs of injury. Normal range of motion.     Cervical back: Normal range of motion and neck supple.     Right lower leg: No edema.     Left lower leg: No edema.  Lymphadenopathy:     Cervical: No cervical adenopathy.  Skin:    General: Skin is warm and dry.     Coloration: Skin is not jaundiced or pale.     Findings: No bruising, erythema, lesion or rash.  Neurological:     General: No focal deficit present.     Mental Status: She is alert and oriented to person, place, and time.     Cranial Nerves: No cranial nerve deficit.     Motor: No abnormal muscle tone.     Coordination: Coordination normal.     Deep Tendon Reflexes: Reflexes normal.  Psychiatric:        Mood and Affect: Mood normal.        Behavior: Behavior normal.        Thought Content: Thought content normal.        Judgment: Judgment normal.       Assessment & Plan:  1. Routine general medical examination at a health care facility - Continue to stay active and exercise - Follow up in 1 year or sooner if needed - CBC with Differential/Platelet - Comprehensive metabolic panel - Lipid panel - TSH  2. Essential hypertension - No change in medications. Follow up with cardiology as directed - CBC with Differential/Platelet - Comprehensive metabolic panel - Lipid panel - TSH  3. Acquired hypothyroidism - Consider dose change of synthroid  - CBC with Differential/Platelet - Comprehensive metabolic panel - Lipid panel - TSH  4. Gastroesophageal reflux disease without esophagitis - Continue with Protonix   5. Benign paroxysmal positional vertigo due to bilateral vestibular disorder  - LORazepam (ATIVAN) 0.5 MG tablet; Take 1 tablet  (0.5 mg total) by mouth daily as needed.  Dispense: 30 tablet; Refill: 0   6. Mixed hyperlipidemia - likely need to start on statin  - CBC with Differential/Platelet - Comprehensive metabolic panel - Lipid panel - TSH   Dorothyann Peng, NP

## 2019-05-11 NOTE — Telephone Encounter (Signed)
Jasmine Wheeler from [facility], wanted to let you know that Spironolactone will interact with some of the pt's other medicine. She wants to confirm , that it is alright to refill it.wrptessage:

## 2019-05-12 NOTE — Telephone Encounter (Signed)
Pt called in and would like to receive a call about her labs.

## 2019-05-13 ENCOUNTER — Telehealth: Payer: Self-pay | Admitting: Adult Health

## 2019-05-13 ENCOUNTER — Other Ambulatory Visit: Payer: Self-pay

## 2019-05-13 DIAGNOSIS — N289 Disorder of kidney and ureter, unspecified: Secondary | ICD-10-CM

## 2019-05-13 MED ORDER — SIMVASTATIN 10 MG PO TABS: 10.0000 mg | ORAL_TABLET | Freq: Every day | ORAL | 0 refills | Status: DC

## 2019-05-13 MED ORDER — LEVOTHYROXINE SODIUM 100 MCG PO TABS: 100.0000 ug | ORAL_TABLET | Freq: Every day | ORAL | 0 refills | Status: DC

## 2019-05-13 NOTE — Addendum Note (Signed)
Addended by: Gwenyth Ober R on: 05/13/2019 03:27 PM   Modules accepted: Orders

## 2019-05-13 NOTE — Telephone Encounter (Signed)
Called pt 2x to go over labs but no answer. Will call back

## 2019-05-13 NOTE — Telephone Encounter (Signed)
This has been taking care of.

## 2019-05-13 NOTE — Telephone Encounter (Signed)
Pt returning call about her lab work.

## 2019-05-18 ENCOUNTER — Telehealth: Payer: Self-pay | Admitting: Adult Health

## 2019-05-18 ENCOUNTER — Other Ambulatory Visit: Payer: Self-pay | Admitting: *Deleted

## 2019-05-18 DIAGNOSIS — E039 Hypothyroidism, unspecified: Secondary | ICD-10-CM

## 2019-05-18 MED ORDER — METOPROLOL SUCCINATE ER 50 MG PO TB24: 50.0000 mg | ORAL_TABLET | Freq: Every day | ORAL | 5 refills | Status: DC

## 2019-05-18 MED ORDER — METOPROLOL SUCCINATE ER 100 MG PO TB24: ORAL_TABLET | ORAL | 5 refills | Status: DC

## 2019-05-18 NOTE — Telephone Encounter (Signed)
Left message for patient to return call to office to schedule lab appointment.

## 2019-05-18 NOTE — Telephone Encounter (Signed)
Pt called to schedule her labs that was dicussed on 05/13/19 for a month out. I do not see any lab orders. Informed pt Jasmine Wheeler will need to place orders for lab. Pt would like a call back once the orders are sent in. Pt can be contacted at [phone]

## 2019-05-19 ENCOUNTER — Telehealth: Payer: Self-pay | Admitting: Adult Health

## 2019-06-07 ENCOUNTER — Other Ambulatory Visit: Payer: Self-pay

## 2019-06-08 ENCOUNTER — Encounter: Payer: Self-pay | Admitting: Adult Health

## 2019-06-08 ENCOUNTER — Other Ambulatory Visit (INDEPENDENT_AMBULATORY_CARE_PROVIDER_SITE_OTHER): Payer: Medicare HMO

## 2019-06-08 ENCOUNTER — Other Ambulatory Visit: Payer: Medicare HMO

## 2019-06-08 DIAGNOSIS — E039 Hypothyroidism, unspecified: Secondary | ICD-10-CM

## 2019-06-08 LAB — TSH: TSH: 0.75 u[IU]/mL (ref 0.35–4.50)

## 2019-06-09 ENCOUNTER — Other Ambulatory Visit: Payer: Medicare HMO

## 2019-06-09 MED ORDER — LEVOTHYROXINE SODIUM 100 MCG PO TABS: 100.0000 ug | ORAL_TABLET | Freq: Every day | ORAL | 0 refills | Status: DC

## 2019-06-09 NOTE — Telephone Encounter (Signed)
Spoke to pt and advised that Rx has been sent to the pharmacy. No further action needed.

## 2019-06-13 ENCOUNTER — Other Ambulatory Visit: Payer: Self-pay | Admitting: *Deleted

## 2019-06-13 MED ORDER — TRIAMTERENE-HCTZ 37.5-25 MG PO TABS: 0.5000 | ORAL_TABLET | Freq: Every day | ORAL | 3 refills | Status: DC

## 2019-06-24 ENCOUNTER — Other Ambulatory Visit: Payer: Self-pay | Admitting: Family Medicine

## 2019-06-24 MED ORDER — LEVOTHYROXINE SODIUM 100 MCG PO TABS: 100.0000 ug | ORAL_TABLET | Freq: Every day | ORAL | 3 refills | Status: DC

## 2019-06-24 NOTE — Telephone Encounter (Signed)
Sent to [facility] mail order. 

## 2019-06-25 ENCOUNTER — Encounter: Payer: Self-pay | Admitting: Adult Health

## 2019-06-25 MED ORDER — LEVOTHYROXINE SODIUM 100 MCG PO TABS: 100.0000 ug | ORAL_TABLET | Freq: Every day | ORAL | 3 refills | Status: DC

## 2019-06-25 MED ORDER — SIMVASTATIN 10 MG PO TABS: 10.0000 mg | ORAL_TABLET | Freq: Every day | ORAL | 0 refills | Status: DC

## 2019-06-27 ENCOUNTER — Other Ambulatory Visit: Payer: Self-pay

## 2019-06-27 MED ORDER — SYNTHROID 100 MCG PO TABS: 100.0000 ug | ORAL_TABLET | Freq: Every day | ORAL | 2 refills | Status: DC

## 2019-07-06 DIAGNOSIS — H2513 Age-related nuclear cataract, bilateral: Secondary | ICD-10-CM | POA: Diagnosis not present

## 2019-07-06 DIAGNOSIS — H43813 Vitreous degeneration, bilateral: Secondary | ICD-10-CM | POA: Diagnosis not present

## 2019-07-27 ENCOUNTER — Other Ambulatory Visit: Payer: Self-pay | Admitting: Obstetrics & Gynecology

## 2019-07-27 DIAGNOSIS — Z1231 Encounter for screening mammogram for malignant neoplasm of breast: Secondary | ICD-10-CM

## 2019-08-02 ENCOUNTER — Other Ambulatory Visit: Payer: Self-pay | Admitting: Obstetrics & Gynecology

## 2019-08-02 DIAGNOSIS — Z01419 Encounter for gynecological examination (general) (routine) without abnormal findings: Secondary | ICD-10-CM | POA: Diagnosis not present

## 2019-08-02 DIAGNOSIS — E2839 Other primary ovarian failure: Secondary | ICD-10-CM

## 2019-08-02 DIAGNOSIS — Z6831 Body mass index (BMI) 31.0-31.9, adult: Secondary | ICD-10-CM | POA: Diagnosis not present

## 2019-08-15 DIAGNOSIS — I129 Hypertensive chronic kidney disease with stage 1 through stage 4 chronic kidney disease, or unspecified chronic kidney disease: Secondary | ICD-10-CM | POA: Diagnosis not present

## 2019-08-15 DIAGNOSIS — N1832 Chronic kidney disease, stage 3b: Secondary | ICD-10-CM | POA: Diagnosis not present

## 2019-08-17 ENCOUNTER — Encounter: Payer: Self-pay | Admitting: Adult Health

## 2019-08-22 MED ORDER — METOPROLOL SUCCINATE ER 100 MG PO TB24: ORAL_TABLET | ORAL | 0 refills | Status: DC

## 2019-08-22 MED ORDER — METOPROLOL SUCCINATE ER 50 MG PO TB24: 50.0000 mg | ORAL_TABLET | Freq: Every day | ORAL | 0 refills | Status: DC

## 2019-08-25 ENCOUNTER — Encounter: Payer: Self-pay | Admitting: Adult Health

## 2019-09-09 ENCOUNTER — Ambulatory Visit: Payer: Medicare HMO

## 2019-09-11 ENCOUNTER — Other Ambulatory Visit: Payer: Self-pay | Admitting: Cardiovascular Disease

## 2019-09-11 ENCOUNTER — Other Ambulatory Visit: Payer: Self-pay | Admitting: Internal Medicine

## 2019-10-04 ENCOUNTER — Other Ambulatory Visit: Payer: Self-pay | Admitting: Adult Health

## 2019-10-06 NOTE — Telephone Encounter (Signed)
Sent to the pharmacy by e-scribe. 

## 2019-10-09 ENCOUNTER — Other Ambulatory Visit: Payer: Self-pay | Admitting: Adult Health

## 2019-10-11 NOTE — Telephone Encounter (Signed)
SENT TO THE PHARMACY BY E-SCRIBE. 

## 2019-10-13 ENCOUNTER — Other Ambulatory Visit: Payer: Self-pay

## 2019-10-13 ENCOUNTER — Ambulatory Visit
Admission: RE | Admit: 2019-10-13 | Discharge: 2019-10-13 | Disposition: A | Discharge status: Home / Self Care | Payer: Medicare HMO | Source: Ambulatory Visit | Attending: Obstetrics & Gynecology | Admitting: Obstetrics & Gynecology

## 2019-10-13 DIAGNOSIS — Z1231 Encounter for screening mammogram for malignant neoplasm of breast: Secondary | ICD-10-CM | POA: Diagnosis not present

## 2019-10-13 DIAGNOSIS — M85851 Other specified disorders of bone density and structure, right thigh: Secondary | ICD-10-CM | POA: Diagnosis not present

## 2019-10-13 DIAGNOSIS — E2839 Other primary ovarian failure: Secondary | ICD-10-CM

## 2019-10-13 DIAGNOSIS — Z780 Asymptomatic menopausal state: Secondary | ICD-10-CM | POA: Diagnosis not present

## 2019-10-13 LAB — HM DEXA SCAN

## 2019-10-13 LAB — HM MAMMOGRAPHY

## 2019-10-19 ENCOUNTER — Encounter: Payer: Self-pay | Admitting: Family Medicine

## 2019-10-20 DIAGNOSIS — D1801 Hemangioma of skin and subcutaneous tissue: Secondary | ICD-10-CM | POA: Diagnosis not present

## 2019-10-20 DIAGNOSIS — L821 Other seborrheic keratosis: Secondary | ICD-10-CM | POA: Diagnosis not present

## 2019-10-20 DIAGNOSIS — Z85820 Personal history of malignant melanoma of skin: Secondary | ICD-10-CM | POA: Diagnosis not present

## 2019-10-20 DIAGNOSIS — D2272 Melanocytic nevi of left lower limb, including hip: Secondary | ICD-10-CM | POA: Diagnosis not present

## 2019-10-20 DIAGNOSIS — L570 Actinic keratosis: Secondary | ICD-10-CM | POA: Diagnosis not present

## 2019-10-20 DIAGNOSIS — D225 Melanocytic nevi of trunk: Secondary | ICD-10-CM | POA: Diagnosis not present

## 2019-10-20 DIAGNOSIS — L918 Other hypertrophic disorders of the skin: Secondary | ICD-10-CM | POA: Diagnosis not present

## 2019-10-20 DIAGNOSIS — D2262 Melanocytic nevi of left upper limb, including shoulder: Secondary | ICD-10-CM | POA: Diagnosis not present

## 2019-10-20 DIAGNOSIS — D2271 Melanocytic nevi of right lower limb, including hip: Secondary | ICD-10-CM | POA: Diagnosis not present

## 2019-10-21 ENCOUNTER — Encounter: Payer: Self-pay | Admitting: Family Medicine

## 2019-11-02 ENCOUNTER — Other Ambulatory Visit: Payer: Self-pay | Admitting: Internal Medicine

## 2019-11-02 ENCOUNTER — Other Ambulatory Visit: Payer: Self-pay | Admitting: Cardiovascular Disease

## 2019-11-03 ENCOUNTER — Other Ambulatory Visit: Payer: Self-pay

## 2019-11-03 ENCOUNTER — Encounter: Payer: Self-pay | Admitting: Cardiovascular Disease

## 2019-11-03 ENCOUNTER — Ambulatory Visit: Payer: Medicare HMO | Admitting: Cardiovascular Disease

## 2019-11-03 VITALS — BP 187/82 | HR 70 | Ht 62.0 in | Wt 175.4 lb

## 2019-11-03 DIAGNOSIS — I351 Nonrheumatic aortic (valve) insufficiency: Secondary | ICD-10-CM | POA: Diagnosis not present

## 2019-11-03 DIAGNOSIS — E7800 Pure hypercholesterolemia, unspecified: Secondary | ICD-10-CM

## 2019-11-03 DIAGNOSIS — I453 Trifascicular block: Secondary | ICD-10-CM | POA: Diagnosis not present

## 2019-11-03 DIAGNOSIS — I10 Essential (primary) hypertension: Secondary | ICD-10-CM

## 2019-11-03 DIAGNOSIS — N1831 Chronic kidney disease, stage 3a: Secondary | ICD-10-CM | POA: Diagnosis not present

## 2019-11-03 DIAGNOSIS — E039 Hypothyroidism, unspecified: Secondary | ICD-10-CM | POA: Diagnosis not present

## 2019-11-03 DIAGNOSIS — I712 Thoracic aortic aneurysm, without rupture: Secondary | ICD-10-CM | POA: Diagnosis not present

## 2019-11-03 DIAGNOSIS — I471 Supraventricular tachycardia: Secondary | ICD-10-CM

## 2019-11-03 DIAGNOSIS — I700 Atherosclerosis of aorta: Secondary | ICD-10-CM

## 2019-11-03 DIAGNOSIS — I7121 Aneurysm of the ascending aorta, without rupture: Secondary | ICD-10-CM

## 2019-11-03 NOTE — Patient Instructions (Signed)
Medication Instructions:  No changes *If you need a refill on your cardiac medications before your next appointment, please call your pharmacy*   Lab Work: Your provider would like for you to have the following labs: TSH, Lipid and BMET  If you have labs (blood work) drawn today and your tests are completely normal, you will receive your results only by: Marland Kitchen MyChart Message (if you have MyChart) OR . A paper copy in the mail If you have any lab test that is abnormal or we need to change your treatment, we will call you to review the results.   Testing/Procedures: None ordered   Follow-Up: At [facility], you and your health needs are our priority.  As part of our continuing mission to provide you with exceptional heart care, we have created designated Provider Care Teams.  These Care Teams include your primary Cardiologist (physician) and Advanced Practice Providers (APPs -  Physician Assistants and Nurse Practitioners) who all work together to provide you with the care you need, when you need it.  We recommend signing up for the patient portal called "MyChart".  Sign up information is provided on this After Visit Summary.  MyChart is used to connect with patients for Virtual Visits (Telemedicine).  Patients are able to view lab/test results, encounter notes, upcoming appointments, etc.  Non-urgent messages can be sent to your provider as well.   To learn more about what you can do with MyChart, go to [email or web address].    Your next appointment:   12 month(s)  The format for your next appointment:   In Person  Provider:   You may see Sanda Klein, MD or one of the following Advanced Practice Providers on your designated Care Team:    Almyra Deforest, PA-C  Fabian Sharp, PA-C or   Roby Lofts, [place]

## 2019-11-03 NOTE — Progress Notes (Signed)
Cardiology office note:    Date:  11/05/2019   ID:  Season, Astacio 06/07/1945, MRN [id number]  PCP:  Dorothyann Peng, NP  Cardiologist:  Sanda Klein, MD  Electrophysiologist:  None   Referring MD: Stevie Kern, MD  No chief complaint on file.    History of Present Illness:    Jasmine Wheeler is a 74 y.o. 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  She has done quite well from an arrhythmia point of view since her last appointment, without any sustained events.  She has had a couple of brief palpitations.  She also denies dizziness, syncope, angina, dyspnea at rest or with activity, lower extremity edema or intermittent claudication.  Her headaches have been present.  Her blood pressure is high today, but at her recent office visit it was only 130/80.  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  Her nephrologist is Dr. Pearson Grippe.  The most recent creatinine that I have available for review on April 26 was 1.53.  She is only taking simvastatin every other night due to muscle aches.  She has a history of dysphagia and has previously undergone esophagoplasty and is on chronic treatment with proton pump inhibitors.  Her lumbar spine problems have improved and she decided not to have surgery at this point.  Because of a heart murmur she underwent an echocardiogram in 2016.  The echo showed normal left ventricular systolic function with an ejection fraction of 60-65%, left ventricular hypertrophy with mild diastolic dysfunction, a dilated ascending aorta of 44 mm with trivial aortic valve insufficiency.  A follow-up echo that we performed in 2019 shows no significant interval change.  A CT angiogram of the chest performed in October 2020  confirmed that she has a dilated ascending aorta, but the maximum diameter was only 4.0 cm.  Aortic atherosclerosis was reported. .  Past Medical History:  Diagnosis Date  . Adenomatous colon polyp   . Asthma   . Benign gastric polyp - hyperplastic 09/2017  . Chronic low back pain   . Diverticulosis 01/23/2010   left colon  . Heart murmur 2016  . Hypertension   . Hypothyroidism   . IBS (irritable bowel syndrome)     Past Surgical History:  Procedure Laterality Date  . ABDOMINAL HYSTERECTOMY    . COLONOSCOPY W/ BIOPSIES  multiple  . HEMICOLECTOMY  02/15/10   right, tubulovillous adenoma appendix, Dr. Georgette Dover  . HEMORRHOID BANDING  2014  . LUMBAR EPIDURAL INJECTION  2011   x 2  . MELANOMA EXCISION Left 2012   left arm, not sure about lymph nodes  . REPEAT CESAREAN SECTION     3 in all, last 1973    Current Medications: Current Meds  Medication Sig  . albuterol (VENTOLIN HFA) 108 (90 Base) MCG/ACT inhaler INHALE 2 PUFFS EVERY SIX HOURS AS NEEDED FOR WHEEZING OR SHORTNESS OF BREATH (Patient taking differently: Inhale 2 puffs into the lungs every 6 (six) hours as needed for wheezing or shortness of breath. )  . Ascorbic Acid (VITAMIN C) 500 MG tablet daily. Take 2 tablet daily   . aspirin 81 MG EC tablet Take 1 tablet (81 mg total) by mouth daily with breakfast.  . Calcium Carbonate-Vitamin D (CALCIUM-VITAMIN D) 500-200 MG-UNIT per tablet Take 1 tablet by mouth 2 (two)  times daily with a meal.    . cetirizine (ZYRTEC) 10 MG tablet Take 10 mg by mouth daily.  . Cranberry 1000 MG CAPS Take by mouth. Take 2 tablets daily   . fish oil-omega-3 fatty acids 1000 MG capsule Take 1 g by mouth every evening.   . fluticasone (FLONASE) 50 MCG/ACT nasal spray USE 2 SPRAYS INTO BOTH NOSTRILS DAILY.  . metoprolol succinate (TOPROL-XL) 100 MG 24 hr tablet Take one tablet in the morning. Take with or immediately following a meal. Need OV for future refills  . Multiple Vitamin (MULTIVITAMIN)  tablet Take 1 tablet by mouth daily.    . pantoprazole (PROTONIX) 40 MG tablet TAKE 1 TABLET EVERY MORNING  . Peppermint Oil (IBGARD) 90 MG CPCR Take 2 capsules by mouth 2 (two) times daily.  . quinapril (ACCUPRIL) 40 MG tablet Take 1 tablet (40 mg total) by mouth 2 (two) times daily.  . simvastatin (ZOCOR) 10 MG tablet TAKE 1 TABLET (10 MG TOTAL) BY MOUTH AT BEDTIME.  Marland Kitchen spironolactone (ALDACTONE) 25 MG tablet Take 1 tablet (25 mg total) by mouth daily.  Marland Kitchen SYNTHROID 100 MCG tablet Take 1 tablet (100 mcg total) by mouth daily before breakfast.  . triamterene-hydrochlorothiazide (MAXZIDE-25) 37.5-25 MG tablet Take 0.5 tablets by mouth daily.  . vitamin B-12 (CYANOCOBALAMIN) 500 MCG tablet Take 500 mcg by mouth daily.    . [DISCONTINUED] metoprolol succinate (TOPROL-XL) 50 MG 24 hr tablet Take 1 tablet (50 mg total) by mouth at bedtime. Take with or immediately following a meal. Need OV for future refills     Allergies:   Dicyclomine, Nifedipine, Amlodipine, and Theophylline   Social History   Socioeconomic History  . Marital status: Married    Spouse name: Not on file  . Number of children: 3  . Years of education: Not on file  . Highest education level: Not on file  Occupational History  . 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MGR    Employer: [employer]  Tobacco Use  . Smoking status: Never Smoker  . Smokeless tobacco: Never Used  Vaping Use  . Vaping Use: Never used  Substance and Sexual Activity  . Alcohol use: No  . Drug use: No  . Sexual activity: Not on file  Other Topics Concern  . Not on file  Social History Narrative   Married retired lives in [place]   No alcohol tobacco or drug use   Caffeine is 2 cups of coffee daily   3 children   Elderly mother lives with her and requires some care    Social Determinants of Health   Financial Resource Strain:   . Difficulty of Paying Living Expenses:   Food Insecurity:   . Worried About Charity fundraiser in the Last Year:   . Youth worker in the Last Year:   Transportation Needs:   . Film/video editor (Medical):   Marland Kitchen Lack of Transportation (Non-Medical):   Physical Activity:   . Days of Exercise per Week:   . Minutes of Exercise per Session:   Stress:   . Feeling of Stress :   Social Connections:   . Frequency of Communication with Friends and Family:   . Frequency of Social Gatherings with Friends and Family:   . Attends Religious Services:   . Active Member of Clubs or Organizations:   . Attends Archivist Meetings:   Marland Kitchen Marital Status:      Family History: The patient's family history includes Colon cancer  in her maternal uncle, maternal uncle, and mother; Heart disease in her father; Lung cancer in her father.  ROS:   Please see the history of present illness.    All other systems are reviewed and are negative.   EKGs/Labs/Other Studies Reviewed:    The following studies were reviewed today:   ECHO 03/13/2019:    Result status: Final result                    1. Left ventricular ejection fraction, by visual estimation, is 60 to 65%. The left ventricle has normal function. There is moderately increased left ventricular hypertrophy.  2. Left ventricular diastolic parameters are consistent with Grade I diastolic dysfunction (impaired relaxation).  3. Global right ventricle has normal systolic function.The right ventricular size is normal. No increase in right ventricular wall thickness.  4. Left atrial size was mildly dilated.  5. Right atrial size was normal.  6. Mild aortic valve annular calcification.  7. The mitral valve is normal in structure. Trace mitral valve regurgitation. No evidence of mitral stenosis.  8. The tricuspid valve is normal in structure. Tricuspid valve regurgitation is trivial.  9. The aortic valve is tricuspid. Aortic valve regurgitation is mild. No evidence of aortic valve sclerosis or stenosis. 10. There is Mild calcification of the aortic valve. 11. There  is Mild thickening of the aortic valve. 12. The pulmonic valve was not well visualized. Pulmonic valve regurgitation is not visualized. 13. There is mild dilatation of the ascending aorta measuring 39 mm. 14. Mildly elevated pulmonary artery systolic pressure. 15. The inferior vena cava is normal in size with greater than 50% respiratory variability, suggesting right atrial pressure of 3 mmHg.    EKG:  EKG is ordered today and shows normal sinus rhythm, left anterior fascicular block, nonspecific ST-T changes, QTC 423 ms.   The tracings from 03/12/2019 showed narrow complex tachycardia in the 150s with pseudo-r' in V1 highly suggestive of AV node reentry. Recent Labs: 03/12/2019: Magnesium 1.9 05/11/2019: ALT 10; Hemoglobin 11.4; Platelets 208.0 11/04/2019: BUN 27; Creatinine, Ser 1.40; Potassium 4.7; Sodium 140; TSH 0.383  Recent Lipid Panel    Component Value Date/Time   CHOL 179 11/04/2019 0905   TRIG 127 11/04/2019 0905   TRIG 59 02/12/2006 1105   HDL 47 11/04/2019 0905   CHOLHDL 3.8 11/04/2019 0905   CHOLHDL 4 05/11/2019 1040   VLDL 29.2 05/11/2019 1040   LDLCALC 109 (H) 11/04/2019 0905    Physical Exam:    VS:  BP (!) 187/82   Pulse 70   Ht 5\' 2"  (1.575 m)   Wt 175 lb 6.4 oz (79.6 kg)   SpO2 97%   BMI 32.08 kg/m     Wt Readings from Last 3 Encounters:  11/03/19 175 lb 6.4 oz (79.6 kg)  05/11/19 173 lb (78.5 kg)  04/14/19 172 lb 9.6 oz (78.3 kg)     General: Alert, oriented x3, no distress, borderline obese Head: no evidence of trauma, PERRL, EOMI, no exophtalmos or lid lag, no myxedema, no xanthelasma; normal ears, nose and oropharynx Neck: normal jugular venous pulsations and no hepatojugular reflux; brisk carotid pulses without delay and no carotid bruits Chest: clear to auscultation, no signs of consolidation by percussion or palpation, normal fremitus, symmetrical and full respiratory excursions Cardiovascular: normal position and quality of the apical impulse,  regular rhythm, normal first and second heart sounds, 1/6 decrescendo murmur at the left lower sternal border, no systolic murmurs, rubs or gallops  Abdomen: no tenderness or distention, no masses by palpation, no abnormal pulsatility or arterial bruits, normal bowel sounds, no hepatosplenomegaly Extremities: no clubbing, cyanosis or edema; 2+ radial, ulnar and brachial pulses bilaterally; 2+ right femoral, posterior tibial and dorsalis pedis pulses; 2+ left femoral, posterior tibial and dorsalis pedis pulses; no subclavian or femoral bruits Neurological: grossly nonfocal Psych: Normal mood and affect   ASSESSMENT:    1. AVNRT (AV nodal re-entry tachycardia) ([place])   2. Nonrheumatic aortic valve insufficiency   3. Ascending aortic aneurysm ([place])   4. Trifascicular block   5. Essential hypertension   6. Hypercholesterolemia   7. Atherosclerosis of aorta ([place])   8. Stage 3a chronic kidney disease   9. Acquired hypothyroidism    PLAN:    In order of problems listed above:  1. AVNRT: She has not had any recurrent sustained events on the current dose of beta-blocker.  Notes that she has left anterior fascicular block and long AV conduction time and in the past has also had right bundle branch block and at times left posterior fascicular block.  Avoid higher doses of beta-blocker.  May require AV nodal and slow pathway ablation and/or pacemaker implantation in the future.  2. 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 3. AscAoAneur: This appears to be stable in size.  If her follow-up CT angiogram in December this year still shows no progression I would stop performing yearly follow-up since she has renal insufficiency. 4. 1st deg AVB, RBBB+LAFB/LPFB: Avoid combination beta-blocker and centrally acting calcium channel blocker, which caused symptoms of bradycardia in the past.  Monitor for symptoms of advanced heart block (asked her to promptly report extreme fatigue, syncope or  dizziness that is not related to changes in position).  Although she clearly has significant conduction system disease, she has no symptoms of bradycardia to suggest that she needs a pacemaker implantation at this time. 5. HTN: Blood pressure appears to remain quite high.  Important to keep blood pressure under 130/80 due to renal dysfunction and aortic aneurysm.  Add amlodipine 5 mg once daily. 6. Aortic atherosclerosis: Noted on CT chest 7. HLP: Time to recheck her lipid profile.  Target LDL preferably less than 70.  We may not be able to achieve this with statins due to side effects.  Might do better with rosuvastatin rather than simvastatin. 8. CKD 3: We will recheck her bmet when she has her lipid profile.  On ACEi. Sees Dr. Joelyn Oms. 9. Hypothyroidism: Also recheck a TSH.  She did not do well on generic levothyroxine and needs to take Synthroid.  Medication Adjustments/Labs and Tests Ordered: Current medicines are reviewed at length with the patient today.  Concerns regarding medicines are outlined above.  Orders Placed This Encounter  Procedures  . TSH  . Basic metabolic panel  . Lipid panel  . EKG 12-Lead   Meds ordered this encounter  Medications                 . amLODipine (NORVASC) 5 MG tablet    Sig: Take 1 tablet (5 mg total) by mouth daily.    Dispense:  90 tablet    Refill:  1    Patient Instructions  Medication Instructions:  No changes *If you need a refill on your cardiac medications before your next appointment, please call your pharmacy*   Lab Work: Your provider would like for you to have the following labs: TSH, Lipid and BMET  If you have labs (blood  work) drawn today and your tests are completely normal, you will receive your results only by: Marland Kitchen MyChart Message (if you have MyChart) OR . A paper copy in the mail If you have any lab test that is abnormal or we need to change your treatment, we will call you to review the  results.   Testing/Procedures: None ordered   Follow-Up: At [facility], you and your health needs are our priority.  As part of our continuing mission to provide you with exceptional heart care, we have created designated Provider Care Teams.  These Care Teams include your primary Cardiologist (physician) and Advanced Practice Providers (APPs -  Physician Assistants and Nurse Practitioners) who all work together to provide you with the care you need, when you need it.  We recommend signing up for the patient portal called "MyChart".  Sign up information is provided on this After Visit Summary.  MyChart is used to connect with patients for Virtual Visits (Telemedicine).  Patients are able to view lab/test results, encounter notes, upcoming appointments, etc.  Non-urgent messages can be sent to your provider as well.   To learn more about what you can do with MyChart, go to [email or web address].    Your next appointment:   12 month(s)  The format for your next appointment:   In Person  Provider:   You may see Sanda Klein, MD or one of the following Advanced Practice Providers on your designated Care Team:    Almyra Deforest, PA-C  Fabian Sharp, [place] or   Roby Lofts, PA-C       Signed, Sanda Klein, MD  11/05/2019 5:13 PM    [place]

## 2019-11-04 DIAGNOSIS — I10 Essential (primary) hypertension: Secondary | ICD-10-CM | POA: Diagnosis not present

## 2019-11-04 DIAGNOSIS — I471 Supraventricular tachycardia: Secondary | ICD-10-CM | POA: Diagnosis not present

## 2019-11-04 MED ORDER — METOPROLOL SUCCINATE ER 50 MG PO TB24: 50.0000 mg | ORAL_TABLET | Freq: Every day | ORAL | 0 refills | Status: DC

## 2019-11-05 ENCOUNTER — Encounter: Payer: Self-pay | Admitting: Adult Health

## 2019-11-05 LAB — BASIC METABOLIC PANEL
BUN/Creatinine Ratio: 19 (ref 12–28)
BUN: 27 mg/dL (ref 8–27)
CO2: 26 mmol/L (ref 20–29)
Calcium: 9.1 mg/dL (ref 8.7–10.3)
Chloride: 101 mmol/L (ref 96–106)
Creatinine, Ser: 1.4 mg/dL — ABNORMAL HIGH (ref 0.57–1.00)
GFR calc Af Amer: 42 mL/min/{1.73_m2} — ABNORMAL LOW (ref >59)
GFR calc non Af Amer: 37 mL/min/{1.73_m2} — ABNORMAL LOW (ref >59)
Glucose: 93 mg/dL (ref 65–99)
Potassium: 4.7 mmol/L (ref 3.5–5.2)
Sodium: 140 mmol/L (ref 134–144)

## 2019-11-05 LAB — LIPID PANEL
Chol/HDL Ratio: 3.8 ratio (ref 0.0–4.4)
Cholesterol, Total: 179 mg/dL (ref 100–199)
HDL: 47 mg/dL (ref >39)
LDL Chol Calc (NIH): 109 mg/dL — ABNORMAL HIGH (ref 0–99)
Triglycerides: 127 mg/dL (ref 0–149)
VLDL Cholesterol Cal: 23 mg/dL (ref 5–40)

## 2019-11-05 LAB — TSH: TSH: 0.383 u[IU]/mL — ABNORMAL LOW (ref 0.450–4.500)

## 2019-11-07 ENCOUNTER — Telehealth: Payer: Self-pay | Admitting: *Deleted

## 2019-11-07 DIAGNOSIS — E7800 Pure hypercholesterolemia, unspecified: Secondary | ICD-10-CM

## 2019-11-07 MED ORDER — ROSUVASTATIN CALCIUM 10 MG PO TABS
10.0000 mg | ORAL_TABLET | ORAL | 3 refills | Status: DC
Start: 2019-11-07 — End: 2020-03-27

## 2019-11-07 NOTE — Telephone Encounter (Signed)
Patient made aware of results and verbalized understanding.  Rosuvastatin 10 mg twice a week has been sent into [facility] for her.

## 2019-11-07 NOTE — Telephone Encounter (Signed)
-----   Message from Sanda Klein, MD sent at 11/05/2019  5:18 PM EDT ----- TSH is very slightly lower than normal (meaning a little too much Synthroid), but can probably just watch that. Kidney parameters appear to be at baseline. Will send Dr. Joelyn Oms a copy. Cholesterol is not adequately treated. For more than one reason (better efficacy, lower side effect profile, less drug interactions), I suspect will do better with Crestor than simvastatin.  Stop simvastatin. Start rosuvastatin 10 mg twice a week. Recheck lipids in 3-6 months.

## 2019-11-08 ENCOUNTER — Other Ambulatory Visit: Payer: Self-pay | Admitting: Adult Health

## 2019-11-08 ENCOUNTER — Other Ambulatory Visit: Payer: Self-pay

## 2019-11-08 DIAGNOSIS — E039 Hypothyroidism, unspecified: Secondary | ICD-10-CM

## 2019-11-08 MED ORDER — METOPROLOL SUCCINATE ER 100 MG PO TB24: ORAL_TABLET | ORAL | 3 refills | Status: DC

## 2019-11-10 ENCOUNTER — Other Ambulatory Visit: Payer: Self-pay

## 2019-11-22 ENCOUNTER — Other Ambulatory Visit (INDEPENDENT_AMBULATORY_CARE_PROVIDER_SITE_OTHER): Payer: Medicare HMO

## 2019-11-22 ENCOUNTER — Other Ambulatory Visit: Payer: Self-pay

## 2019-11-22 DIAGNOSIS — E039 Hypothyroidism, unspecified: Secondary | ICD-10-CM | POA: Diagnosis not present

## 2019-11-22 LAB — TSH: TSH: 0.44 mIU/L (ref 0.40–4.50)

## 2019-11-30 ENCOUNTER — Encounter: Payer: Self-pay | Admitting: Internal Medicine

## 2019-12-09 ENCOUNTER — Encounter: Payer: Self-pay | Admitting: Adult Health

## 2019-12-20 ENCOUNTER — Telehealth: Payer: Self-pay | Admitting: Adult Health

## 2019-12-20 ENCOUNTER — Ambulatory Visit: Payer: Medicare HMO

## 2019-12-20 ENCOUNTER — Other Ambulatory Visit: Payer: Self-pay

## 2019-12-20 NOTE — Progress Notes (Signed)
   Subjective:   Erroneous encounter

## 2019-12-20 NOTE — Telephone Encounter (Signed)
I called patient Jasmine Wheeler today to do medicare wellness visit. Left 2 voicemails on her mobile phone and one voicemail on her home phone. Advised patient to call the front desk and reschedule her appointment.

## 2019-12-23 ENCOUNTER — Other Ambulatory Visit: Payer: Self-pay

## 2019-12-23 ENCOUNTER — Ambulatory Visit (INDEPENDENT_AMBULATORY_CARE_PROVIDER_SITE_OTHER): Payer: Medicare HMO

## 2019-12-23 DIAGNOSIS — Z0000 Encounter for general adult medical examination without abnormal findings: Secondary | ICD-10-CM | POA: Diagnosis not present

## 2019-12-23 NOTE — Patient Instructions (Signed)
Jasmine Wheeler , Thank you for taking time to come for your Medicare Wellness Visit. I appreciate your ongoing commitment to your health goals. Please review the following plan we discussed and let me know if I can assist you in the future.   Screening recommendations/referrals: Colonoscopy: Due, Scheduled 02/03/2020 Mammogram: Up to date, next due 10/12/2020 Bone Density: Up to date, next due 10/12/2024 Recommended yearly ophthalmology/optometry visit for glaucoma screening and checkup Recommended yearly dental visit for hygiene and checkup  Vaccinations: Influenza vaccine: Currently due, may receive once we get high dose in Pneumococcal vaccine: Completed series Tdap vaccine: Up to date, next due 04/08/2020 Shingles vaccine: Completed series    Advanced directives: Advance directive discussed with you today. Even though you declined this today please call our office should you change your mind and we can give you the proper paperwork for you to fill out.   Conditions/risks identified: None   Next appointment: None   Preventive Care 65 Years and Older, Female Preventive care refers to lifestyle choices and visits with your health care provider that can promote health and wellness. What does preventive care include?  A yearly physical exam. This is also called an annual well check.  Dental exams once or twice a year.  Routine eye exams. Ask your health care provider how often you should have your eyes checked.  Personal lifestyle choices, including:  Daily care of your teeth and gums.  Regular physical activity.  Eating a healthy diet.  Avoiding tobacco and drug use.  Limiting alcohol use.  Practicing safe sex.  Taking low-dose aspirin every day.  Taking vitamin and mineral supplements as recommended by your health care provider. What happens during an annual well check? The services and screenings done by your health care provider during your annual well check will  depend on your age, overall health, lifestyle risk factors, and family history of disease. Counseling  Your health care provider may ask you questions about your:  Alcohol use.  Tobacco use.  Drug use.  Emotional well-being.  Home and relationship well-being.  Sexual activity.  Eating habits.  History of falls.  Memory and ability to understand (cognition).  Work and work Statistician.  Reproductive health. Screening  You may have the following tests or measurements:  Height, weight, and BMI.  Blood pressure.  Lipid and cholesterol levels. These may be checked every 5 years, or more frequently if you are over 53 years old.  Skin check.  Lung cancer screening. You may have this screening every year starting at age 73 if you have a 30-pack-year history of smoking and currently smoke or have quit within the past 15 years.  Fecal occult blood test (FOBT) of the stool. You may have this test every year starting at age 5.  Flexible sigmoidoscopy or colonoscopy. You may have a sigmoidoscopy every 5 years or a colonoscopy every 10 years starting at age 89.  Hepatitis C blood test.  Hepatitis B blood test.  Sexually transmitted disease (STD) testing.  Diabetes screening. This is done by checking your blood sugar (glucose) after you have not eaten for a while (fasting). You may have this done every 1-3 years.  Bone density scan. This is done to screen for osteoporosis. You may have this done starting at age 84.  Mammogram. This may be done every 1-2 years. Talk to your health care provider about how often you should have regular mammograms. Talk with your health care provider about your test results, treatment options,  and if necessary, the need for more tests. Vaccines  Your health care provider may recommend certain vaccines, such as:  Influenza vaccine. This is recommended every year.  Tetanus, diphtheria, and acellular pertussis (Tdap, Td) vaccine. You may need a  Td booster every 10 years.  Zoster vaccine. You may need this after age 2.  Pneumococcal 13-valent conjugate (PCV13) vaccine. One dose is recommended after age 2.  Pneumococcal polysaccharide (PPSV23) vaccine. One dose is recommended after age 38. Talk to your health care provider about which screenings and vaccines you need and how often you need them. This information is not intended to replace advice given to you by your health care provider. Make sure you discuss any questions you have with your health care provider. Document Released: 05/04/2015 Document Revised: 12/26/2015 Document Reviewed: 02/06/2015 Elsevier Interactive Patient Education  2017 [place] Prevention in the Home Falls can cause injuries. They can happen to people of all ages. There are many things you can do to make your home safe and to help prevent falls. What can I do on the outside of my home?  Regularly fix the edges of walkways and driveways and fix any cracks.  Remove anything that might make you trip as you walk through a door, such as a raised step or threshold.  Trim any bushes or trees on the path to your home.  Use bright outdoor lighting.  Clear any walking paths of anything that might make someone trip, such as rocks or tools.  Regularly check to see if handrails are loose or broken. Make sure that both sides of any steps have handrails.  Any raised decks and porches should have guardrails on the edges.  Have any leaves, snow, or ice cleared regularly.  Use sand or salt on walking paths during winter.  Clean up any spills in your garage right away. This includes oil or grease spills. What can I do in the bathroom?  Use night lights.  Install grab bars by the toilet and in the tub and shower. Do not use towel bars as grab bars.  Use non-skid mats or decals in the tub or shower.  If you need to sit down in the shower, use a plastic, non-slip stool.  Keep the floor dry. Clean  up any water that spills on the floor as soon as it happens.  Remove soap buildup in the tub or shower regularly.  Attach bath mats securely with double-sided non-slip rug tape.  Do not have throw rugs and other things on the floor that can make you trip. What can I do in the bedroom?  Use night lights.  Make sure that you have a light by your bed that is easy to reach.  Do not use any sheets or blankets that are too big for your bed. They should not hang down onto the floor.  Have a firm chair that has side arms. You can use this for support while you get dressed.  Do not have throw rugs and other things on the floor that can make you trip. What can I do in the kitchen?  Clean up any spills right away.  Avoid walking on wet floors.  Keep items that you use a lot in easy-to-reach places.  If you need to reach something above you, use a strong step stool that has a grab bar.  Keep electrical cords out of the way.  Do not use floor polish or wax that makes floors slippery. If  you must use wax, use non-skid floor wax.  Do not have throw rugs and other things on the floor that can make you trip. What can I do with my stairs?  Do not leave any items on the stairs.  Make sure that there are handrails on both sides of the stairs and use them. Fix handrails that are broken or loose. Make sure that handrails are as long as the stairways.  Check any carpeting to make sure that it is firmly attached to the stairs. Fix any carpet that is loose or worn.  Avoid having throw rugs at the top or bottom of the stairs. If you do have throw rugs, attach them to the floor with carpet tape.  Make sure that you have a light switch at the top of the stairs and the bottom of the stairs. If you do not have them, ask someone to add them for you. What else can I do to help prevent falls?  Wear shoes that:  Do not have high heels.  Have rubber bottoms.  Are comfortable and fit you well.  Are  closed at the toe. Do not wear sandals.  If you use a stepladder:  Make sure that it is fully opened. Do not climb a closed stepladder.  Make sure that both sides of the stepladder are locked into place.  Ask someone to hold it for you, if possible.  Clearly mark and make sure that you can see:  Any grab bars or handrails.  First and last steps.  Where the edge of each step is.  Use tools that help you move around (mobility aids) if they are needed. These include:  Canes.  Walkers.  Scooters.  Crutches.  Turn on the lights when you go into a dark area. Replace any light bulbs as soon as they burn out.  Set up your furniture so you have a clear path. Avoid moving your furniture around.  If any of your floors are uneven, fix them.  If there are any pets around you, be aware of where they are.  Review your medicines with your doctor. Some medicines can make you feel dizzy. This can increase your chance of falling. Ask your doctor what other things that you can do to help prevent falls. This information is not intended to replace advice given to you by your health care provider. Make sure you discuss any questions you have with your health care provider. Document Released: 02/01/2009 Document Revised: 09/13/2015 Document Reviewed: 05/12/2014 Elsevier Interactive Patient Education  2017 [employer].

## 2019-12-23 NOTE — Progress Notes (Addendum)
Subjective:   Jasmine Wheeler is a 74 y.o. female who presents for Medicare Annual (Subsequent) preventive examination.   I connected with Jasmine Wheeler today by telephone and verified that I am speaking with the correct person using two identifiers. Location patient: home Location provider: work Persons participating in the virtual visit: patient, provider.   I discussed the limitations, risks, security and privacy concerns of performing an evaluation and management service by telephone and the availability of in person appointments. I also discussed with the patient that there may be a patient responsible charge related to this service. The patient expressed understanding and verbally consented to this telephonic visit.    Interactive audio and video telecommunications were attempted between this provider and patient, however failed, due to patient having technical difficulties OR patient did not have access to video capability.  We continued and completed visit with audio only.     Review of Systems    N/A Cardiac Risk Factors include: advanced age (>44men, >30 women);hypertension     Objective:    Today's Vitals   There is no height or weight on file to calculate BMI.  Advanced Directives 12/23/2019 03/12/2019 09/21/2017  Does Patient Have a Medical Advance Directive? No No No  Would patient like information on creating a medical advance directive? No - Patient declined No - Patient declined No - Patient declined    Current Medications (verified) Outpatient Encounter Medications as of 12/23/2019  Medication Sig  . albuterol (VENTOLIN HFA) 108 (90 Base) MCG/ACT inhaler INHALE 2 PUFFS EVERY SIX HOURS AS NEEDED FOR WHEEZING OR SHORTNESS OF BREATH (Patient taking differently: Inhale 2 puffs into the lungs every 6 (six) hours as needed for wheezing or shortness of breath. )  . Ascorbic Acid (VITAMIN C) 500 MG tablet daily. Take 2 tablet daily   . aspirin 81 MG EC tablet Take 1  tablet (81 mg total) by mouth daily with breakfast.  . Calcium Carbonate-Vitamin D (CALCIUM-VITAMIN D) 500-200 MG-UNIT per tablet Take 1 tablet by mouth 2 (two) times daily with a meal.    . cetirizine (ZYRTEC) 10 MG tablet Take 10 mg by mouth daily.  . Cranberry 1000 MG CAPS Take by mouth. Take 2 tablets daily   . fish oil-omega-3 fatty acids 1000 MG capsule Take 1 g by mouth every evening.   . fluticasone (FLONASE) 50 MCG/ACT nasal spray USE 2 SPRAYS INTO BOTH NOSTRILS DAILY.  . Melatonin 5 MG CAPS Take by mouth.  . metoprolol succinate (TOPROL-XL) 100 MG 24 hr tablet Take one tablet in the morning. Take with or immediately following a meal.  . Multiple Vitamin (MULTIVITAMIN) tablet Take 1 tablet by mouth daily.    . Multiple Vitamins-Minerals (VITAMIN D3 COMPLETE PO) Take by mouth.  . pantoprazole (PROTONIX) 40 MG tablet TAKE 1 TABLET EVERY MORNING  . Peppermint Oil (IBGARD) 90 MG CPCR Take 2 capsules by mouth 2 (two) times daily.  . quinapril (ACCUPRIL) 40 MG tablet Take 1 tablet (40 mg total) by mouth 2 (two) times daily.  . rosuvastatin (CRESTOR) 10 MG tablet Take 1 tablet (10 mg total) by mouth 2 (two) times a week.  . spironolactone (ALDACTONE) 25 MG tablet Take 1 tablet (25 mg total) by mouth daily.  Marland Kitchen SYNTHROID 100 MCG tablet Take 1 tablet (100 mcg total) by mouth daily before breakfast.  . triamterene-hydrochlorothiazide (MAXZIDE-25) 37.5-25 MG tablet Take 0.5 tablets by mouth daily.  . vitamin B-12 (CYANOCOBALAMIN) 500 MCG tablet Take 500 mcg by mouth  daily.     No facility-administered encounter medications on file as of 12/23/2019.    Allergies (verified) Dicyclomine, Nifedipine, Amlodipine, and Theophylline   History: Past Medical History:  Diagnosis Date  . Adenomatous colon polyp   . Asthma   . Benign gastric polyp - hyperplastic 09/2017  . Chronic low back pain   . Diverticulosis 01/23/2010   left colon  . Heart murmur 2016  . Hypertension   . Hypothyroidism   .  IBS (irritable bowel syndrome)    Past Surgical History:  Procedure Laterality Date  . ABDOMINAL HYSTERECTOMY    . COLONOSCOPY W/ BIOPSIES  multiple  . HEMICOLECTOMY  02/15/10   right, tubulovillous adenoma appendix, Dr. Georgette Dover  . HEMORRHOID BANDING  2014  . LUMBAR EPIDURAL INJECTION  2011   x 2  . MELANOMA EXCISION Left 2012   left arm, not sure about lymph nodes  . REPEAT CESAREAN SECTION     3 in all, last 65   Family History  Problem Relation Age of Onset  . Lung cancer Father   . Heart disease Father   . Colon cancer Mother   . Colon cancer Maternal Uncle   . Colon cancer Maternal Uncle    Social History   Socioeconomic History  . Marital status: Married    Spouse name: Not on file  . Number of children: 3  . Years of education: Not on file  . Highest education level: Not on file  Occupational History  . 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MGR    Employer: [employer]  Tobacco Use  . Smoking status: Never Smoker  . Smokeless tobacco: Never Used  Vaping Use  . Vaping Use: Never used  Substance and Sexual Activity  . Alcohol use: No  . Drug use: No  . Sexual activity: Not on file  Other Topics Concern  . Not on file  Social History Narrative   Married retired lives in [place]   No alcohol tobacco or drug use   Caffeine is 2 cups of coffee daily   3 children   Elderly mother lives with her and requires some care    Social Determinants of Health   Financial Resource Strain: Low Risk   . Difficulty of Paying Living Expenses: Not hard at all  Food Insecurity: No Food Insecurity  . Worried About Charity fundraiser in the Last Year: Never true  . Ran Out of Food in the Last Year: Never true  Transportation Needs: No Transportation Needs  . Lack of Transportation (Medical): No  . Lack of Transportation (Non-Medical): No  Physical Activity: Inactive  . Days of Exercise per Week: 0 days  . Minutes of Exercise per Session: 0 min  Stress: No Stress Concern Present  .  Feeling of Stress : Not at all  Social Connections: Moderately Integrated  . Frequency of Communication with Friends and Family: More than three times a week  . Frequency of Social Gatherings with Friends and Family: More than three times a week  . Attends Religious Services: More than 4 times per year  . Active Member of Clubs or Organizations: No  . Attends Archivist Meetings: Never  . Marital Status: Married    Tobacco Counseling Counseling given: Not Answered   Clinical Intake:  Pre-visit preparation completed: Yes  Pain : No/denies pain     Nutritional Risks: None Diabetes: No  How often do you need to have someone help you when you read instructions, pamphlets, or other  written materials from your doctor or pharmacy?: 1 - Never What is the last grade level you completed in school?: High School Graduate  Diabetic?No  Interpreter Needed?: No  Information entered by :: [place] of Daily Living In your present state of health, do you have any difficulty performing the following activities: 12/23/2019 03/13/2019  Hearing? N N  Vision? N N  Difficulty concentrating or making decisions? N Y  Walking or climbing stairs? N N  Dressing or bathing? N N  Doing errands, shopping? N N  Preparing Food and eating ? N -  Using the Toilet? N -  In the past six months, have you accidently leaked urine? N -  Do you have problems with loss of bowel control? N -  Managing your Medications? N -  Managing your Finances? N -  Housekeeping or managing your Housekeeping? N -  Some recent data might be hidden    Patient Care Team: Dorothyann Peng, NP as PCP - General (Family Medicine) Croitoru, Dani Gobble, MD as PCP - Cardiology (Cardiology) Sydnee Cabal, MD as Consulting Physician (Orthopedic Surgery) Gus Height, MD (Inactive) as Consulting Physician (Obstetrics and Gynecology)  Indicate any recent Medical Services you may have received from other than [facility]  providers in the past year (date may be approximate).     Assessment:   This is a routine wellness examination for Jasmine Wheeler.  Hearing/Vision screen  Hearing Screening   125Hz  250Hz  500Hz  1000Hz  2000Hz  3000Hz  4000Hz  6000Hz  8000Hz   Right ear:           Left ear:           Vision Screening Comments: Patient states gets eyes examined once yearly   Dietary issues and exercise activities discussed: Current Exercise Habits: The patient does not participate in regular exercise at present, Exercise limited by: orthopedic condition(s)  Goals    . Weight (lb) < 155 lb (70.3 kg)      Depression Screen PHQ 2/9 Scores 12/23/2019 05/06/2018 03/04/2016 12/29/2014 07/11/2013  PHQ - 2 Score 0 0 0 0 1  PHQ- 9 Score 0 - - - -    Fall Risk Fall Risk  12/23/2019 05/06/2018 03/04/2016 12/29/2014 07/11/2013  Falls in the past year? 0 0 Yes No No  Number falls in past yr: 0 - 1 - -  Injury with Fall? 0 - Yes - -  Risk for fall due to : Medication side effect;Orthopedic patient - Impaired balance/gait;Other (Comment) - -  Risk for fall due to: Comment - - Pt tripped over a ladder - -  Follow up Falls evaluation completed;Falls prevention discussed - - - -    Any stairs in or around the home? Yes  If so, are there any without handrails? No  Home free of loose throw rugs in walkways, pet beds, electrical cords, etc? Yes  Adequate lighting in your home to reduce risk of falls? Yes   ASSISTIVE DEVICES UTILIZED TO PREVENT FALLS:  Life alert? No  Use of a cane, walker or w/c? No  Grab bars in the bathroom? Yes  Shower chair or bench in shower? Yes  Elevated toilet seat or a handicapped toilet? No     Cognitive Function:     6CIT Screen 12/23/2019  What Year? 0 points  What month? 0 points  What time? 0 points  Count back from 20 0 points  Months in reverse 0 points  Repeat phrase 0 points  Total Score 0    Immunizations Immunization  History  Administered Date(s) Administered  . Fluad Quad(high  Dose 65+) 12/30/2018  . Influenza Split 01/29/2011, 01/20/2012  . Influenza Whole 02/20/2004, 01/13/2008, 02/16/2009, 12/17/2009  . Influenza, High Dose Seasonal PF 12/29/2014, 01/15/2016, 01/20/2017, 01/07/2018  . Influenza,inj,Quad PF,6+ Mos 01/10/2013  . Influenza,inj,quad, With Preservative 01/19/2017  . Influenza-Unspecified 01/13/2014  . Pneumococcal Conjugate-13 07/11/2013  . Pneumococcal Polysaccharide-23 02/20/2004, 01/29/2011  . Td 04/21/2000, 04/08/2010  . Zoster 01/29/2011  . Zoster Recombinat (Shingrix) 07/21/2017, 09/21/2017    TDAP status: Up to date Flu Vaccine status: Up to date Pneumococcal vaccine status: Up to date Covid-19 vaccine status: Declined, Education has been provided regarding the importance of this vaccine but patient still declined. Advised may receive this vaccine at local pharmacy or Health Dept.or vaccine clinic. Aware to provide a copy of the vaccination record if obtained from local pharmacy or Health Dept. Verbalized acceptance and understanding.  Qualifies for Shingles Vaccine? Yes   Zostavax completed Yes   Shingrix Completed?: Yes  Screening Tests Health Maintenance  Topic Date Due  . COVID-19 Vaccine (1) Never done  . COLONOSCOPY  10/26/2019  . INFLUENZA VACCINE  11/20/2019  . TETANUS/TDAP  04/08/2020  . MAMMOGRAM  10/12/2020  . DEXA SCAN  Completed  . Hepatitis C Screening  Completed  . PNA vac Low Risk Adult  Completed    Health Maintenance  Health Maintenance Due  Topic Date Due  . COVID-19 Vaccine (1) Never done  . COLONOSCOPY  10/26/2019  . INFLUENZA VACCINE  11/20/2019    Colorectal cancer screening: Completed 10/21/2014. Repeat every 5. years Mammogram status: Completed 10/13/2019. Repeat every year Bone Density status: Completed 10/13/2019. Results reflect: Bone density results: OSTEOPENIA. Repeat every 5 years.  Lung Cancer Screening: (Low Dose CT Chest recommended if Age 27-80 years, 30 pack-year currently smoking OR  have quit w/in 15years.) does not qualify.   Lung Cancer Screening Referral: N/A  Additional Screening:  Hepatitis C Screening: does qualify; Completed 03/04/2016  Vision Screening: Recommended annual ophthalmology exams for early detection of glaucoma and other disorders of the eye. Is the patient up to date with their annual eye exam?  Yes  Who is the provider or what is the name of the office in which the patient attends annual eye exams? Dr. Jorja Loa If pt is not established with a provider, would they like to be referred to a provider to establish care? No .   Dental Screening: Recommended annual dental exams for proper oral hygiene  Community Resource Referral / Chronic Care Management: CRR required this visit?  No   CCM required this visit?  No      Plan:     I have personally reviewed and noted the following in the patient's chart:   . Medical and social history . Use of alcohol, tobacco or illicit drugs  . Current medications and supplements . Functional ability and status . Nutritional status . Physical activity . Advanced directives . List of other physicians . Hospitalizations, surgeries, and ER visits in previous 12 months . Vitals . Screenings to include cognitive, depression, and falls . Referrals and appointments  In addition, I have reviewed and discussed with patient certain preventive protocols, quality metrics, and best practice recommendations. A written personalized care plan for preventive services as well as general preventive health recommendations were provided to patient.     Ofilia Neas, LPN   07/28/1854   Nurse Notes: Patient has refused the COVID vaccine.   I have collaborated with the care management  provider regarding care management and care coordination activities outlined in this encounter and have reviewed this encounter including documentation in the note and care plan. I am certifying that I agree with the content of this note and  encounter as supervising physician.  Dorothyann Peng, NP

## 2020-01-02 ENCOUNTER — Encounter: Payer: Self-pay | Admitting: Adult Health

## 2020-01-10 ENCOUNTER — Ambulatory Visit (INDEPENDENT_AMBULATORY_CARE_PROVIDER_SITE_OTHER): Payer: Medicare HMO | Admitting: *Deleted

## 2020-01-10 ENCOUNTER — Other Ambulatory Visit: Payer: Self-pay

## 2020-01-10 DIAGNOSIS — Z23 Encounter for immunization: Secondary | ICD-10-CM | POA: Diagnosis not present

## 2020-01-11 DIAGNOSIS — H2513 Age-related nuclear cataract, bilateral: Secondary | ICD-10-CM | POA: Diagnosis not present

## 2020-01-15 ENCOUNTER — Other Ambulatory Visit: Payer: Self-pay | Admitting: Adult Health

## 2020-01-15 ENCOUNTER — Other Ambulatory Visit: Payer: Self-pay | Admitting: Cardiovascular Disease

## 2020-01-15 ENCOUNTER — Other Ambulatory Visit: Payer: Self-pay | Admitting: Internal Medicine

## 2020-01-20 ENCOUNTER — Ambulatory Visit (AMBULATORY_SURGERY_CENTER): Payer: Self-pay | Admitting: *Deleted

## 2020-01-20 ENCOUNTER — Other Ambulatory Visit: Payer: Self-pay

## 2020-01-20 VITALS — Ht 62.0 in | Wt 173.0 lb

## 2020-01-20 DIAGNOSIS — Z01818 Encounter for other preprocedural examination: Secondary | ICD-10-CM

## 2020-01-20 DIAGNOSIS — Z86010 Personal history of colonic polyps: Secondary | ICD-10-CM

## 2020-01-20 MED ORDER — SUPREP BOWEL PREP KIT 17.5-3.13-1.6 GM/177ML PO SOLN: 1.0000 | Freq: Once | ORAL | 0 refills | Status: AC

## 2020-01-20 NOTE — Progress Notes (Signed)
10-13 cov test [place]  No egg or soy allergy known to patient  No issues with past sedation with any surgeries or procedures no intubation problems in the past  No FH of Malignant Hyperthermia No diet pills per patient No home 02 use per patient  No blood thinners per patient  Pt denies issues with constipation  No A fib or A flutter  EMMI video to pt or via [place] 19 guidelines implemented in PV today with Pt and RN     Due to the COVID-19 pandemic we are asking patients to follow these guidelines. Please only bring one care partner. Please be aware that your care partner may wait in the car in the parking lot or if they feel like they will be too hot to wait in the car, they may wait in the lobby on the 4th floor. All care partners are required to wear a mask the entire time (we do not have any that we can provide them), they need to practice social distancing, and we will do a Covid check for all patient's and care partners when you arrive. Also we will check their temperature and your temperature. If the care partner waits in their car they need to stay in the parking lot the entire time and we will call them on their cell phone when the patient is ready for discharge so they can bring the car to the front of the building. Also all patient's will need to wear a mask into building.

## 2020-02-01 ENCOUNTER — Other Ambulatory Visit: Payer: Self-pay | Admitting: Gastroenterology

## 2020-02-01 DIAGNOSIS — Z1159 Encounter for screening for other viral diseases: Secondary | ICD-10-CM | POA: Diagnosis not present

## 2020-02-01 LAB — SARS CORONAVIRUS 2 (TAT 6-24 HRS): SARS Coronavirus 2: NEGATIVE

## 2020-02-02 ENCOUNTER — Other Ambulatory Visit: Payer: Self-pay | Admitting: Cardiovascular Disease

## 2020-02-02 ENCOUNTER — Other Ambulatory Visit: Payer: Self-pay | Admitting: Adult Health

## 2020-02-03 ENCOUNTER — Other Ambulatory Visit: Payer: Self-pay

## 2020-02-03 ENCOUNTER — Encounter: Payer: Self-pay | Admitting: Gastroenterology

## 2020-02-03 ENCOUNTER — Encounter: Payer: Medicare HMO | Admitting: Internal Medicine

## 2020-02-03 ENCOUNTER — Ambulatory Visit (AMBULATORY_SURGERY_CENTER): Payer: Medicare HMO | Admitting: Gastroenterology

## 2020-02-03 VITALS — BP 164/79 | HR 55 | Temp 97.5°F | Resp 17 | Ht >= 80 in | Wt 173.0 lb

## 2020-02-03 DIAGNOSIS — K641 Second degree hemorrhoids: Secondary | ICD-10-CM

## 2020-02-03 DIAGNOSIS — K621 Rectal polyp: Secondary | ICD-10-CM | POA: Diagnosis not present

## 2020-02-03 DIAGNOSIS — K5730 Diverticulosis of large intestine without perforation or abscess without bleeding: Secondary | ICD-10-CM

## 2020-02-03 DIAGNOSIS — Z86010 Personal history of colonic polyps: Secondary | ICD-10-CM

## 2020-02-03 DIAGNOSIS — D129 Benign neoplasm of anus and anal canal: Secondary | ICD-10-CM

## 2020-02-03 DIAGNOSIS — D128 Benign neoplasm of rectum: Secondary | ICD-10-CM

## 2020-02-03 MED ORDER — SODIUM CHLORIDE 0.9 % IV SOLN: 500.0000 mL | INTRAVENOUS | Status: DC

## 2020-02-03 NOTE — Progress Notes (Signed)
A/ox3, pleased with MAC, report to RN 

## 2020-02-03 NOTE — Patient Instructions (Signed)
Handouts given:  Polyps, Diverticulosis, Hemorrhids Resume previous diet  continue present medications Await pathology results  YOU HAD AN ENDOSCOPIC PROCEDURE TODAY AT [facility]:   Refer to the procedure report that was given to you for any specific questions about what was found during the examination.  If the procedure report does not answer your questions, please call your gastroenterologist to clarify.  If you requested that your care partner not be given the details of your procedure findings, then the procedure report has been included in a sealed envelope for you to review at your convenience later.  YOU SHOULD EXPECT: Some feelings of bloating in the abdomen. Passage of more gas than usual.  Walking can help get rid of the air that was put into your GI tract during the procedure and reduce the bloating. If you had a lower endoscopy (such as a colonoscopy or flexible sigmoidoscopy) you may notice spotting of blood in your stool or on the toilet paper. If you underwent a bowel prep for your procedure, you may not have a normal bowel movement for a few days.  Please Note:  You might notice some irritation and congestion in your nose or some drainage.  This is from the oxygen used during your procedure.  There is no need for concern and it should clear up in a day or so.  SYMPTOMS TO REPORT IMMEDIATELY:   Following lower endoscopy (colonoscopy or flexible sigmoidoscopy):  Excessive amounts of blood in the stool  Significant tenderness or worsening of abdominal pains  Swelling of the abdomen that is new, acute  Fever of 100F or higher  For urgent or emergent issues, a gastroenterologist can be reached at any hour by calling [phone]. Do not use MyChart messaging for urgent concerns.    DIET:  We do recommend a small meal at first, but then you may proceed to your regular diet.  Drink plenty of fluids but you should avoid alcoholic beverages for 24  hours.  ACTIVITY:  You should plan to take it easy for the rest of today and you should NOT DRIVE or use heavy machinery until tomorrow (because of the sedation medicines used during the test).    FOLLOW UP: Our staff will call the number listed on your records 48-72 hours following your procedure to check on you and address any questions or concerns that you may have regarding the information given to you following your procedure. If we do not reach you, we will leave a message.  We will attempt to reach you two times.  During this call, we will ask if you have developed any symptoms of COVID 19. If you develop any symptoms (ie: fever, flu-like symptoms, shortness of breath, cough etc.) before then, please call [phone].  If you test positive for Covid 19 in the 2 weeks post procedure, please call and report this information to [place].    If any biopsies were taken you will be contacted by phone or by letter within the next 1-3 weeks.  Please call us at [phone] if you have not heard about the biopsies in 3 weeks.    SIGNATURES/CONFIDENTIALITY: You and/or your care partner have signed paperwork which will be entered into your electronic medical record.  These signatures attest to the fact that that the information above on your After Visit Summary has been reviewed and is understood.  Full responsibility of the confidentiality of this discharge information lies with you and/or your care-partner.

## 2020-02-03 NOTE — Progress Notes (Signed)
Pt's states no medical or surgical changes since previsit or office visit. 

## 2020-02-03 NOTE — Progress Notes (Signed)
Called to room to assist during endoscopic procedure.  Patient ID and intended procedure confirmed with present staff. Received instructions for my participation in the procedure from the performing physician.  

## 2020-02-03 NOTE — Op Note (Signed)
[facility] Patient Name: Jasmine Wheeler Procedure Date: 02/03/2020 2:02 PM MRN: [id number] Endoscopist: Gerrit Heck , MD Age: 74 Referring MD:  Date of Birth: 10/28/1945 Gender: Female Account #: [id number] Procedure:                Colonoscopy Indications:              Surveillance: Personal history of adenomatous                            polyps on last colonoscopy 5 years ago                           History of TVA in 2011 requiring right                            hemicolectomy. Repeat colonoscopy in 2014 with 8 mm                            tubular adenoma. Last colonoscopy was 10/2014 and no                            polyps noted. She presents today for ongoing                            surveillance.                           Family history notable for mother and maternal                            uncles x2 with colon cancer. Medicines:                Monitored Anesthesia Care Procedure:                Pre-Anesthesia Assessment:                           - Prior to the procedure, a History and Physical                            was performed, and patient medications and                            allergies were reviewed. The patient's tolerance of                            previous anesthesia was also reviewed. The risks                            and benefits of the procedure and the sedation                            options and risks were discussed with the patient.  All questions were answered, and informed consent                            was obtained. Prior Anticoagulants: The patient has                            taken no previous anticoagulant or antiplatelet                            agents. ASA Grade Assessment: II - A patient with                            mild systemic disease. After reviewing the risks                            and benefits, the patient was deemed in                            satisfactory  condition to undergo the procedure.                           After obtaining informed consent, the colonoscope                            was passed under direct vision. Throughout the                            procedure, the patient's blood pressure, pulse, and                            oxygen saturations were monitored continuously. The                            Colonoscope was introduced through the anus and                            advanced to the the ileocolonic anastomosis. The                            colonoscopy was performed without difficulty. The                            patient tolerated the procedure well. The quality                            of the bowel preparation was fair. the terminal                            ileum and the rectum were photographed. Scope In: 2:13:02 PM Scope Out: 2:34:09 PM Scope Withdrawal Time: 0 hours 18 minutes 36 seconds  Total Procedure Duration: 0 hours 21 minutes 7 seconds  Findings:                 The perianal and digital rectal examinations were  normal.                           A 2 mm polyp was found in the rectum. The polyp was                            sessile. The polyp was removed with a cold snare.                            Resection and retrieval were complete. Estimated                            blood loss was minimal.                           A 18 mm polyp was found in the rectum. The polyp                            was flat. The polyp was removed with a saline                            injection-lift technique using 3 cc saline and a                            hot snare. Resection and retrieval were complete.                            Estimated blood loss was minimal.                           Many small and large-mouthed diverticula were found                            in the sigmoid colon and descending colon.                           There was evidence of a prior end-to-side                             ileo-colonic anastomosis in the transverse colon.                            This was patent and was characterized by healthy                            appearing mucosa. The anastomosis was traversed.                           A moderate amount of semi-solid stool was found at                            the anastomosis, interfering with visualization.  Despite lavage of the area using copious amounts of                            tap water and patient repositioning, could not                            completely clear this area and still with fair                            visualization. Cannot rule out the presence of                            small or flat polyps in this area.                           The neo-terminal ileum appeared normal.                           Non-bleeding internal hemorrhoids were found during                            retroflexion. The hemorrhoids were small and Grade                            II (internal hemorrhoids that prolapse but reduce                            spontaneously). Complications:            No immediate complications. Estimated Blood Loss:     Estimated blood loss was minimal. Impression:               - Preparation of the colon was fair.                           - One 2 mm polyp in the rectum, removed with a cold                            snare. Resected and retrieved.                           - One 18 mm polyp in the rectum, removed using                            injection-lift and a hot snare. Resected and                            retrieved.                           - Diverticulosis in the sigmoid colon and in the                            descending colon.                           -  Patent end-to-side ileo-colonic anastomosis,                            characterized by healthy appearing mucosa.                           - Stool at the colonic anastomosis.                            - The examined portion of the ileum was normal.                           - Non-bleeding internal hemorrhoids. Recommendation:           - Patient has a contact number available for                            emergencies. The signs and symptoms of potential                            delayed complications were discussed with the                            patient. Return to normal activities tomorrow.                            Written discharge instructions were provided to the                            patient.                           - Resume previous diet.                           - Continue present medications.                           - Await pathology results.                           - Due to personal and family history, along with                            findings on this procedure, I recommend repeat                            colonoscopy in 1 year because the bowel preparation                            was suboptimal for completion of surveillance. Gerrit Heck, MD 02/03/2020 2:49:05 PM

## 2020-02-07 ENCOUNTER — Telehealth: Payer: Self-pay | Admitting: *Deleted

## 2020-02-07 ENCOUNTER — Telehealth: Payer: Self-pay

## 2020-02-07 NOTE — Telephone Encounter (Signed)
Attempted f/u phone call. No answer. Left message. °

## 2020-02-07 NOTE — Telephone Encounter (Signed)
  Follow up Call-  Call back number 02/03/2020 09/21/2017  Post procedure Call Back phone  # [phone]  Permission to leave phone message Yes Yes  Some recent data might be hidden     Patient questions:  Do you have a fever, pain , or abdominal swelling? No. Pain Score  0 *  Have you tolerated food without any problems? Yes.    Have you been able to return to your normal activities? Yes.    Do you have any questions about your discharge instructions: Diet   No. Medications  No. Follow up visit  No.  Do you have questions or concerns about your Care? No.  Actions: * If pain score is 4 or above: No action needed, pain <4.  1. Have you developed a fever since your procedure? no  2.   Have you had an respiratory symptoms (SOB or cough) since your procedure? no  3.   Have you tested positive for COVID 19 since your procedure no  4.   Have you had any family members/close contacts diagnosed with the COVID 19 since your procedure?  no   If yes to any of these questions please route to Joylene John, RN and Joella Prince, RN

## 2020-02-08 ENCOUNTER — Telehealth: Payer: Self-pay | Admitting: Cardiovascular Disease

## 2020-02-08 NOTE — Telephone Encounter (Signed)
Pt c/o BP issue: STAT if pt c/o blurred vision, one-sided weakness or slurred speech  1. What are your last 5 BP readings?   135/85 Heart rate 122 today before she took her bp meds  This morning  120/76 Heart rate 67 after meds   2. Are you having any other symptoms (ex. Dizziness, headache, blurred vision, passed out)? Pt denies any other Symptoms   3. What is your BP issue? Pt stated that her dog is sick and she thanks she is just upset and that is why she had the episode this morning.  She just wanted to let Dr Sallyanne Kuster know   Best number [phone]

## 2020-02-08 NOTE — Telephone Encounter (Signed)
Returned pt's call. Pt states earlier today she experienced a sensation of feeling her heart racing. She reports not having taken her morning medication at that time and was also experiencing emotional stress related to a sick pet. She took her heart rate at that time and reports that it was 122. Blood pressure at that time was 135/85. Pt states she felt short of breath as well. Pt took her morning dose of blood pressure medication at that time. After waiting for the medication to take effect, pt began to feel better. Repeat vital signs at that time show improvement: BP 120/76, HR 62. Shortness of breath was resolved at that point.   Emotional support provided to patient regarding her sick dog. Informed pt that we will include this information in her chart. Instructed pt to immediately seek medical attention for any chest pain, dizziness, or more episodes of shortness of breath.

## 2020-02-14 ENCOUNTER — Other Ambulatory Visit: Payer: Self-pay | Admitting: *Deleted

## 2020-02-14 ENCOUNTER — Telehealth: Payer: Self-pay | Admitting: Cardiovascular Disease

## 2020-02-14 DIAGNOSIS — I10 Essential (primary) hypertension: Secondary | ICD-10-CM

## 2020-02-14 NOTE — Telephone Encounter (Signed)
Spoke with patient regarding appointment for CTA chest aorta scheduled 02/23/20 at 1:30 pm at [facility]--[address].  Arrival time is 1:15 pm for check in---Liquids only 2 hours prior to study.  Patient to get lab work done 02/15/20.   Patient voiced her understanding.

## 2020-02-15 ENCOUNTER — Encounter: Payer: Self-pay | Admitting: Gastroenterology

## 2020-02-15 DIAGNOSIS — I10 Essential (primary) hypertension: Secondary | ICD-10-CM | POA: Diagnosis not present

## 2020-02-16 LAB — BASIC METABOLIC PANEL
BUN/Creatinine Ratio: 18 (ref 12–28)
BUN: 26 mg/dL (ref 8–27)
CO2: 25 mmol/L (ref 20–29)
Calcium: 9.2 mg/dL (ref 8.7–10.3)
Chloride: 101 mmol/L (ref 96–106)
Creatinine, Ser: 1.47 mg/dL — ABNORMAL HIGH (ref 0.57–1.00)
GFR calc Af Amer: 40 mL/min/{1.73_m2} — ABNORMAL LOW (ref >59)
GFR calc non Af Amer: 34 mL/min/{1.73_m2} — ABNORMAL LOW (ref >59)
Glucose: 102 mg/dL — ABNORMAL HIGH (ref 65–99)
Potassium: 4.6 mmol/L (ref 3.5–5.2)
Sodium: 139 mmol/L (ref 134–144)

## 2020-02-23 ENCOUNTER — Ambulatory Visit (INDEPENDENT_AMBULATORY_CARE_PROVIDER_SITE_OTHER)
Admission: RE | Admit: 2020-02-23 | Discharge: 2020-02-23 | Disposition: A | Discharge status: Home / Self Care | Payer: Medicare HMO | Source: Ambulatory Visit | Attending: Cardiovascular Disease | Admitting: Cardiovascular Disease

## 2020-02-23 ENCOUNTER — Other Ambulatory Visit: Payer: Self-pay

## 2020-02-23 DIAGNOSIS — I712 Thoracic aortic aneurysm, without rupture, unspecified: Secondary | ICD-10-CM

## 2020-02-23 DIAGNOSIS — I517 Cardiomegaly: Secondary | ICD-10-CM | POA: Diagnosis not present

## 2020-02-23 MED ORDER — IOHEXOL 350 MG/ML SOLN
60.0000 mL | Freq: Once | INTRAVENOUS | Status: AC | PRN
  Administered 2020-02-23: 60 mL via INTRAVENOUS

## 2020-02-27 NOTE — Telephone Encounter (Signed)
Opened in error

## 2020-03-02 ENCOUNTER — Other Ambulatory Visit: Payer: Self-pay | Admitting: Cardiovascular Disease

## 2020-03-08 ENCOUNTER — Other Ambulatory Visit: Payer: Self-pay

## 2020-03-08 MED ORDER — METOPROLOL SUCCINATE ER 100 MG PO TB24
ORAL_TABLET | ORAL | 3 refills | Status: DC
Start: 2020-03-08 — End: 2020-03-19

## 2020-03-08 NOTE — Progress Notes (Signed)
Metoprolol refill sent to [facility].

## 2020-03-18 ENCOUNTER — Other Ambulatory Visit: Payer: Self-pay | Admitting: Adult Health

## 2020-03-19 ENCOUNTER — Other Ambulatory Visit: Payer: Self-pay

## 2020-03-19 MED ORDER — METOPROLOL SUCCINATE ER 100 MG PO TB24
ORAL_TABLET | ORAL | 3 refills | Status: DC
Start: 2020-03-19 — End: 2020-04-09

## 2020-03-20 MED ORDER — METOPROLOL SUCCINATE ER 50 MG PO TB24: 50.0000 mg | ORAL_TABLET | Freq: Every evening | ORAL | 3 refills | Status: DC

## 2020-03-27 DIAGNOSIS — E7800 Pure hypercholesterolemia, unspecified: Secondary | ICD-10-CM

## 2020-03-27 MED ORDER — EZETIMIBE 10 MG PO TABS: 10.0000 mg | ORAL_TABLET | Freq: Every day | ORAL | 3 refills | Status: DC

## 2020-03-27 NOTE — Telephone Encounter (Signed)
May not do well with any statin. Stop rosuvastatin. Give it at least 2-3 weeks to make sure the symptoms completely resolve.  Will try ezetimibe 10 mg daily (not a statin, but unfortunately a relatively weak cholesterol reducer). Recheck lipids in 3 months. thanks

## 2020-03-27 NOTE — Telephone Encounter (Signed)
Spoke with patient. She reports on the days that she takes Rosuvastatin she has been having leg cramps that hit in the middle of the night or early in the morning when she goes to stretch her legs. She does not usually get leg cramps on the days that she is not taking the Crestor. She reports Simvastatin was causing leg cramps as well. Will route to Dr. Sallyanne Kuster for advice.

## 2020-03-27 NOTE — Telephone Encounter (Signed)
Spoke with patient. Informed patient of Dr. Victorino December recommendations. Lab slips mailed and medication sent to preferred pharmacy. Rosuvastatin discontinued.

## 2020-04-02 ENCOUNTER — Other Ambulatory Visit: Payer: Self-pay | Admitting: Adult Health

## 2020-04-02 ENCOUNTER — Other Ambulatory Visit: Payer: Self-pay | Admitting: Internal Medicine

## 2020-04-09 MED ORDER — METOPROLOL SUCCINATE ER 100 MG PO TB24: ORAL_TABLET | ORAL | 3 refills | Status: DC

## 2020-04-23 ENCOUNTER — Telehealth: Payer: Self-pay | Admitting: *Deleted

## 2020-04-23 DIAGNOSIS — R002 Palpitations: Secondary | ICD-10-CM

## 2020-04-23 DIAGNOSIS — E039 Hypothyroidism, unspecified: Secondary | ICD-10-CM

## 2020-04-23 DIAGNOSIS — I10 Essential (primary) hypertension: Secondary | ICD-10-CM

## 2020-04-23 NOTE — Telephone Encounter (Signed)
It's premature to recheck lipids. OK to check BMET, MG TSH. Thanks.

## 2020-04-23 NOTE — Telephone Encounter (Signed)
Lab orders have been placed. The patient will go to the [facility] in [place] when she is done with her quarantine.

## 2020-04-23 NOTE — Telephone Encounter (Signed)
Spoke with the patient concerning her MyChart message. She stated that around 2-3 weeks ago she took a statin holiday due to leg cramps and was started on Zetia. She stated that the cramps are still occurring but they are only in the morning when she stretches. She does not have the cramps any other time of day.  She also stated that she and her husband were both diagnosed with Covid on 04/16/20 (Day 1 of symptoms). She stated that they are both doing fine with mild aches and pains and low grade fever. She has lost her appetite but has been trying to stay hydrated.   She is currently on spironolactone but stated that she still has had problems maintining her potassium levels in the past.   She would be unable to come in for labs until she is out of quarantine.

## 2020-04-27 DIAGNOSIS — I10 Essential (primary) hypertension: Secondary | ICD-10-CM | POA: Diagnosis not present

## 2020-04-27 DIAGNOSIS — E039 Hypothyroidism, unspecified: Secondary | ICD-10-CM | POA: Diagnosis not present

## 2020-04-27 DIAGNOSIS — R002 Palpitations: Secondary | ICD-10-CM | POA: Diagnosis not present

## 2020-04-28 LAB — TSH: TSH: 0.928 u[IU]/mL (ref 0.450–4.500)

## 2020-04-28 LAB — BASIC METABOLIC PANEL
BUN/Creatinine Ratio: 16 (ref 12–28)
BUN: 26 mg/dL (ref 8–27)
CO2: 27 mmol/L (ref 20–29)
Calcium: 9.3 mg/dL (ref 8.7–10.3)
Chloride: 97 mmol/L (ref 96–106)
Creatinine, Ser: 1.63 mg/dL — ABNORMAL HIGH (ref 0.57–1.00)
GFR calc Af Amer: 35 mL/min/{1.73_m2} — ABNORMAL LOW (ref >59)
GFR calc non Af Amer: 30 mL/min/{1.73_m2} — ABNORMAL LOW (ref >59)
Glucose: 102 mg/dL — ABNORMAL HIGH (ref 65–99)
Potassium: 4.7 mmol/L (ref 3.5–5.2)
Sodium: 134 mmol/L (ref 134–144)

## 2020-04-28 LAB — MAGNESIUM: Magnesium: 2.2 mg/dL (ref 1.6–2.3)

## 2020-05-11 ENCOUNTER — Encounter: Payer: Medicare HMO | Admitting: Adult Health

## 2020-05-20 ENCOUNTER — Encounter: Payer: Self-pay | Admitting: Adult Health

## 2020-05-22 MED ORDER — QUINAPRIL HCL 40 MG PO TABS: 40.0000 mg | ORAL_TABLET | Freq: Two times a day (BID) | ORAL | 3 refills | Status: DC

## 2020-06-04 DIAGNOSIS — L570 Actinic keratosis: Secondary | ICD-10-CM | POA: Diagnosis not present

## 2020-06-04 DIAGNOSIS — D485 Neoplasm of uncertain behavior of skin: Secondary | ICD-10-CM | POA: Diagnosis not present

## 2020-06-05 ENCOUNTER — Other Ambulatory Visit: Payer: Self-pay

## 2020-06-06 ENCOUNTER — Ambulatory Visit (INDEPENDENT_AMBULATORY_CARE_PROVIDER_SITE_OTHER): Payer: Medicare HMO | Admitting: Adult Health

## 2020-06-06 ENCOUNTER — Encounter: Payer: Self-pay | Admitting: Adult Health

## 2020-06-06 ENCOUNTER — Ambulatory Visit (INDEPENDENT_AMBULATORY_CARE_PROVIDER_SITE_OTHER): Payer: Medicare HMO

## 2020-06-06 VITALS — BP 142/80 | Temp 98.8°F | Ht 63.0 in | Wt 170.0 lb

## 2020-06-06 DIAGNOSIS — I10 Essential (primary) hypertension: Secondary | ICD-10-CM

## 2020-06-06 DIAGNOSIS — M25572 Pain in left ankle and joints of left foot: Secondary | ICD-10-CM | POA: Diagnosis not present

## 2020-06-06 DIAGNOSIS — Z0000 Encounter for general adult medical examination without abnormal findings: Secondary | ICD-10-CM

## 2020-06-06 DIAGNOSIS — E039 Hypothyroidism, unspecified: Secondary | ICD-10-CM | POA: Diagnosis not present

## 2020-06-06 DIAGNOSIS — I7121 Aneurysm of the ascending aorta, without rupture: Secondary | ICD-10-CM

## 2020-06-06 DIAGNOSIS — M25571 Pain in right ankle and joints of right foot: Secondary | ICD-10-CM | POA: Diagnosis not present

## 2020-06-06 DIAGNOSIS — I712 Thoracic aortic aneurysm, without rupture: Secondary | ICD-10-CM

## 2020-06-06 DIAGNOSIS — N183 Chronic kidney disease, stage 3 unspecified: Secondary | ICD-10-CM | POA: Diagnosis not present

## 2020-06-06 DIAGNOSIS — E782 Mixed hyperlipidemia: Secondary | ICD-10-CM | POA: Diagnosis not present

## 2020-06-06 DIAGNOSIS — M19072 Primary osteoarthritis, left ankle and foot: Secondary | ICD-10-CM | POA: Diagnosis not present

## 2020-06-06 LAB — CBC WITH DIFFERENTIAL/PLATELET
Basophils Absolute: 0 10*3/uL (ref 0.0–0.1)
Basophils Relative: 0.8 % (ref 0.0–3.0)
Eosinophils Absolute: 0.1 10*3/uL (ref 0.0–0.7)
Eosinophils Relative: 3 % (ref 0.0–5.0)
HCT: 31.8 % — ABNORMAL LOW (ref 36.0–46.0)
Hemoglobin: 10.7 g/dL — ABNORMAL LOW (ref 12.0–15.0)
Lymphocytes Relative: 23 % (ref 12.0–46.0)
Lymphs Abs: 0.9 10*3/uL (ref 0.7–4.0)
MCHC: 33.6 g/dL (ref 30.0–36.0)
MCV: 90.6 fl (ref 78.0–100.0)
Monocytes Absolute: 0.3 10*3/uL (ref 0.1–1.0)
Monocytes Relative: 8.6 % (ref 3.0–12.0)
Neutro Abs: 2.5 10*3/uL (ref 1.4–7.7)
Neutrophils Relative %: 64.6 % (ref 43.0–77.0)
Platelets: 207 10*3/uL (ref 150.0–400.0)
RBC: 3.51 Mil/uL — ABNORMAL LOW (ref 3.87–5.11)
RDW: 14.2 % (ref 11.5–15.5)
WBC: 3.8 10*3/uL — ABNORMAL LOW (ref 4.0–10.5)

## 2020-06-06 LAB — COMPREHENSIVE METABOLIC PANEL
ALT: 9 U/L (ref 0–35)
AST: 10 U/L (ref 0–37)
Albumin: 4.2 g/dL (ref 3.5–5.2)
Alkaline Phosphatase: 52 U/L (ref 39–117)
BUN: 34 mg/dL — ABNORMAL HIGH (ref 6–23)
CO2: 29 mEq/L (ref 19–32)
Calcium: 9.3 mg/dL (ref 8.4–10.5)
Chloride: 100 mEq/L (ref 96–112)
Creatinine, Ser: 1.53 mg/dL — ABNORMAL HIGH (ref 0.40–1.20)
GFR: 32.73 mL/min — ABNORMAL LOW (ref >60.00)
Glucose, Bld: 92 mg/dL (ref 70–99)
Potassium: 4.6 mEq/L (ref 3.5–5.1)
Sodium: 138 mEq/L (ref 135–145)
Total Bilirubin: 0.5 mg/dL (ref 0.2–1.2)
Total Protein: 6.3 g/dL (ref 6.0–8.3)

## 2020-06-06 LAB — LIPID PANEL
Cholesterol: 188 mg/dL (ref 0–200)
HDL: 45.8 mg/dL (ref >39.00)
LDL Cholesterol: 110 mg/dL — ABNORMAL HIGH (ref 0–99)
NonHDL: 142.27
Total CHOL/HDL Ratio: 4
Triglycerides: 161 mg/dL — ABNORMAL HIGH (ref 0.0–149.0)
VLDL: 32.2 mg/dL (ref 0.0–40.0)

## 2020-06-06 LAB — TSH: TSH: 0.99 u[IU]/mL (ref 0.35–4.50)
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
## 2020-06-06 NOTE — Addendum Note (Signed)
Addended by: Janann Colonel on: 06/06/2020 09:57 AM   Modules accepted: Orders

## 2020-06-07 ENCOUNTER — Encounter: Payer: Self-pay | Admitting: Adult Health

## 2020-06-07 ENCOUNTER — Other Ambulatory Visit: Payer: Self-pay | Admitting: Internal Medicine

## 2020-06-07 ENCOUNTER — Other Ambulatory Visit: Payer: Self-pay | Admitting: Adult Health

## 2020-06-07 NOTE — Telephone Encounter (Signed)
Sent to the pharmacy by e-scribe. 

## 2020-06-08 ENCOUNTER — Other Ambulatory Visit: Payer: Self-pay | Admitting: Adult Health

## 2020-06-08 DIAGNOSIS — D649 Anemia, unspecified: Secondary | ICD-10-CM

## 2020-06-08 NOTE — Telephone Encounter (Signed)
Spoke with pt. Pt states that she had blood work done this morning and was hoping that this would cover the labs that Dr. Loletha Wheeler wanted her to have done in March.  Reviewed labs that Jasmine Peng, NP however these did not include a FLP.  Explained to pt that after changing her statin therapy that Dr. Loletha Wheeler would want to see how that's working for her. Offered to send pt lab paperwork, but pt states that she has this paperwork and she will come to the office in March to have these labs done.

## 2020-06-25 DIAGNOSIS — E7800 Pure hypercholesterolemia, unspecified: Secondary | ICD-10-CM | POA: Diagnosis not present

## 2020-06-26 LAB — LIPID PANEL
Chol/HDL Ratio: 3.8 ratio (ref 0.0–4.4)
Cholesterol, Total: 186 mg/dL (ref 100–199)
HDL: 49 mg/dL (ref >39)
LDL Chol Calc (NIH): 116 mg/dL — ABNORMAL HIGH (ref 0–99)
Triglycerides: 115 mg/dL (ref 0–149)
VLDL Cholesterol Cal: 21 mg/dL (ref 5–40)

## 2020-07-03 ENCOUNTER — Other Ambulatory Visit: Payer: Self-pay

## 2020-07-04 ENCOUNTER — Other Ambulatory Visit (INDEPENDENT_AMBULATORY_CARE_PROVIDER_SITE_OTHER): Payer: Medicare HMO

## 2020-07-04 ENCOUNTER — Encounter: Payer: Self-pay | Admitting: Adult Health

## 2020-07-04 DIAGNOSIS — D649 Anemia, unspecified: Secondary | ICD-10-CM | POA: Diagnosis not present

## 2020-07-04 LAB — CBC WITH DIFFERENTIAL/PLATELET
Basophils Absolute: 0 10*3/uL (ref 0.0–0.1)
Basophils Relative: 0.7 % (ref 0.0–3.0)
Eosinophils Absolute: 0.1 10*3/uL (ref 0.0–0.7)
Eosinophils Relative: 3.9 % (ref 0.0–5.0)
HCT: 30 % — ABNORMAL LOW (ref 36.0–46.0)
Hemoglobin: 10.1 g/dL — ABNORMAL LOW (ref 12.0–15.0)
Lymphocytes Relative: 26.4 % (ref 12.0–46.0)
Lymphs Abs: 1 10*3/uL (ref 0.7–4.0)
MCHC: 33.7 g/dL (ref 30.0–36.0)
MCV: 90.6 fl (ref 78.0–100.0)
Monocytes Absolute: 0.3 10*3/uL (ref 0.1–1.0)
Monocytes Relative: 8.4 % (ref 3.0–12.0)
Neutro Abs: 2.2 10*3/uL (ref 1.4–7.7)
Neutrophils Relative %: 60.6 % (ref 43.0–77.0)
Platelets: 188 10*3/uL (ref 150.0–400.0)
RBC: 3.31 Mil/uL — ABNORMAL LOW (ref 3.87–5.11)
RDW: 13.8 % (ref 11.5–15.5)
WBC: 3.7 10*3/uL — ABNORMAL LOW (ref 4.0–10.5)

## 2020-07-04 LAB — IBC + FERRITIN
Ferritin: 96.5 ng/mL (ref 10.0–291.0)
Iron: 86 ug/dL (ref 42–145)
Saturation Ratios: 25.7 % (ref 20.0–50.0)
Transferrin: 239 mg/dL (ref 212.0–360.0)

## 2020-07-04 NOTE — Addendum Note (Signed)
Addended by: Elmer Picker on: 07/04/2020 09:55 AM   Modules accepted: Orders

## 2020-07-23 DIAGNOSIS — I129 Hypertensive chronic kidney disease with stage 1 through stage 4 chronic kidney disease, or unspecified chronic kidney disease: Secondary | ICD-10-CM | POA: Diagnosis not present

## 2020-07-23 DIAGNOSIS — N1832 Chronic kidney disease, stage 3b: Secondary | ICD-10-CM | POA: Diagnosis not present

## 2020-07-24 ENCOUNTER — Ambulatory Visit (INDEPENDENT_AMBULATORY_CARE_PROVIDER_SITE_OTHER): Payer: Medicare HMO | Admitting: Pharmacist

## 2020-07-24 ENCOUNTER — Other Ambulatory Visit: Payer: Self-pay

## 2020-07-24 VITALS — BP 142/84 | HR 63 | Resp 15 | Ht 62.5 in | Wt 173.0 lb

## 2020-07-24 DIAGNOSIS — E785 Hyperlipidemia, unspecified: Secondary | ICD-10-CM | POA: Diagnosis not present

## 2020-07-24 DIAGNOSIS — T466X5A Adverse effect of antihyperlipidemic and antiarteriosclerotic drugs, initial encounter: Secondary | ICD-10-CM | POA: Diagnosis not present

## 2020-07-24 DIAGNOSIS — I7121 Aneurysm of the ascending aorta, without rupture: Secondary | ICD-10-CM

## 2020-07-24 DIAGNOSIS — I712 Thoracic aortic aneurysm, without rupture: Secondary | ICD-10-CM

## 2020-07-24 DIAGNOSIS — G720 Drug-induced myopathy: Secondary | ICD-10-CM | POA: Diagnosis not present

## 2020-07-24 NOTE — Progress Notes (Signed)
Patient ID: Jasmine Wheeler                 DOB: [birth year]                    MRN: [id number]     HPI: Jasmine Wheeler is a 75 y.o. female patient referred to lipid clinic by Dr Sallyanne Jasmine Wheeler. PMH is significant for hypertension, esophageal strictures, hypothyroidism, SVT, ascending aortic aneurysm, and atherosclerosis of aorta. Noted patient developed myalgias with rosuvastatin and simvastatin in the past. Currently on ezetimibe 10mg  daily as monotherapy for lipid management.  Patient presents to discuss additional options for lipid management and possible start of PCSK9i therapy.   Current Medications:  Ezetimibe 10mg  daily  Intolerances:  Rosuvastatin 10mg  daily Simvastatin 10mg  daily  LDL goal: < 70mg /dL  Diet: tries to eat healthy balance diet and low in fat  Exercise: activities ofdaily living  Family History: Colon cancer in her maternal uncle, maternal uncle, and mother; Heart disease in her father; Lung cancer in her father.  Social History: denies tobacco and alcohol use  Labs: 06/25/2020: CHO 186, TG 115, HDL 49, LDL-c 116 on ezetimibe 10mg  daily  Past Medical History:  Diagnosis Date  . Abnormality of aortic valve    enlarged   . Adenomatous colon polyp   . Allergy   . Arthritis    back   . Asthma   . Benign gastric polyp - hyperplastic 09/2017  . Cancer ([place])    Melanoma left arm   . Cataract   . Chronic kidney disease    CKD III  . Chronic low back pain   . Diverticulosis 01/23/2010   left colon  . GERD (gastroesophageal reflux disease)   . Heart murmur 2016  . Hyperlipidemia   . Hypertension   . Hypothyroidism   . IBS (irritable bowel syndrome)   . Osteopenia   . SVT (supraventricular tachycardia) ([place])     Current Outpatient Medications on File Prior to Visit  Medication Sig Dispense Refill  . albuterol (VENTOLIN HFA) 108 (90 Base) MCG/ACT inhaler INHALE 2 PUFFS EVERY SIX HOURS AS NEEDED FOR WHEEZING OR SHORTNESS OF BREATH 54 g 3  . Ascorbic  Acid (VITAMIN C) 500 MG tablet daily. Take 2 tablet daily    . aspirin 81 MG EC tablet Take 1 tablet (81 mg total) by mouth daily with breakfast. 30 tablet 12  . Calcium Carbonate-Vitamin D (CALCIUM-VITAMIN D) 500-200 MG-UNIT per tablet Take 1 tablet by mouth 2 (two) times daily with a meal.    . cetirizine (ZYRTEC) 10 MG tablet Take 10 mg by mouth daily.    . Cranberry 1000 MG CAPS Take by mouth. Take 2 tablets daily    . ezetimibe (ZETIA) 10 MG tablet Take 1 tablet (10 mg total) by mouth daily. 90 tablet 3  . fish oil-omega-3 fatty acids 1000 MG capsule Take 1 g by mouth every evening.    . fluticasone (FLONASE) 50 MCG/ACT nasal spray USE 2 SPRAYS INTO BOTH NOSTRILS DAILY. 48 g 1  . Melatonin 5 MG CAPS Take by mouth.    . metoprolol succinate (TOPROL-XL) 100 MG 24 hr tablet Take one tablet in the morning. Take with or immediately following a meal. 90 tablet 3  . metoprolol succinate (TOPROL-XL) 50 MG 24 hr tablet Take 1 tablet (50 mg total) by mouth every evening. Take with or immediately following a meal. 90 tablet 3  . Multiple Vitamin (MULTIVITAMIN) tablet Take 1 tablet  by mouth daily.    . Multiple Vitamins-Minerals (VITAMIN D3 COMPLETE PO) Take by mouth.    . pantoprazole (PROTONIX) 40 MG tablet TAKE 1 TABLET EVERY MORNING (NEED MD APPOINTMENT, PLEASE CALL FOR APPOINTMENT) 90 tablet 0  . Peppermint Oil 90 MG CPCR Take 2 capsules by mouth 2 (two) times daily.    . quinapril (ACCUPRIL) 40 MG tablet Take 1 tablet (40 mg total) by mouth 2 (two) times daily. 180 tablet 3  . spironolactone (ALDACTONE) 25 MG tablet TAKE 1 TABLET EVERY DAY 90 tablet 3  . SYNTHROID 100 MCG tablet TAKE 1 TABLET EVERY MORNING BEFORE BREAKFAST 90 tablet 3  . triamterene-hydrochlorothiazide (MAXZIDE-25) 37.5-25 MG tablet TAKE 1/2 TABLET EVERY DAY 45 tablet 3  . vitamin B-12 (CYANOCOBALAMIN) 500 MCG tablet Take 500 mcg by mouth daily.     No current facility-administered medications on file prior to visit.     Allergies  Allergen Reactions  . Dicyclomine Nausea Only and Other (See Comments)    Nausea, tingling in her whole body, couldn't sleep, made lights bright  . Nifedipine Other (See Comments)    Dropped blood pressure and heart rate too much  . Amlodipine     Dizzy and feel like almost passing out  . Rosuvastatin     MYALGIA  . Simvastatin     MYALGIA  . Theophylline Other (See Comments)    Patient cannot recall reaction    Dyslipidemia, goal LDL below 70 LDL remains above goal for secondary prevention. Noted intolerance to rosuvastatin and simvastatin. Currently on ezetimibe 10mg  monotherapy.   We discussed all available therapeutic options including: Nexletol, Nexlizet, PCSK9i (Repatha/Praluent), and Leqvio.  Patient is a good candidate for PCSK9i initiation but unsure to initiate therapy or not. Will like to discuss optopns with spouse prior to making decision.  Patient will call back and let us know if okay to proceed with any of the above options. Plan to repeat fasting lipid after 2 months of new therapy and follow up as needed.   Jasmine Wheeler PharmD, BCPS, [place] [address] 07/30/2020 11:20 AM

## 2020-07-24 NOTE — Patient Instructions (Addendum)
Your Results:             Your most recent labs Goal  Total Cholesterol 186 < 200  Triglycerides 115 < 150  HDL (good cholesterol) 49 > 40  LDL (bad cholesterol) 116 < 70       *Options* 1. Repatha SureClick 140mg  every 14 days - injection at home (Prefer) 2. Nexletol 180mg  tablets , tablet daily 3. leqvio - injection at [facility]   *Call or send message via myChart to determine selection*  Lab orders: 2 months after new medication started  Patient Assistance:  The [employer] Well foundation offers assistance to help pay for medication copays.  They will cover copays for all cholesterol lowering meds, including statins, fibrates, omega-3 oils, ezetimibe, Repatha, Praluent, Nexletol, Nexlizet.  The cards are usually good for $2,500 or 12 months, whichever comes first. 1. Go to healthwellfoundation.org 2. Click on "Apply Now" 3. Answer questions as to whom is applying (patient or representative) 4. Your disease fund will be "hypercholesterolemia - Medicare access" 5. They will ask questions about finances and which medications you are taking for cholesterol 6. When you submit, the approval is usually within minutes.  You will need to print the card information from the site 7. You will need to show this information to your pharmacy, they will bill your Medicare Part D plan first -then bill Health Well --for the copay.   You can also call them at [phone], although the hold times can be quite long.   Thank you for choosing [facility]

## 2020-07-25 ENCOUNTER — Telehealth: Payer: Self-pay

## 2020-07-25 DIAGNOSIS — E785 Hyperlipidemia, unspecified: Secondary | ICD-10-CM

## 2020-07-25 NOTE — Telephone Encounter (Signed)
Returned a call to pt lmomed and stated that we received phone call and will proceed in the pa process and will keep the posted along the way once we are able to send rx

## 2020-07-30 DIAGNOSIS — G720 Drug-induced myopathy: Secondary | ICD-10-CM | POA: Insufficient documentation

## 2020-07-30 DIAGNOSIS — E785 Hyperlipidemia, unspecified: Secondary | ICD-10-CM | POA: Insufficient documentation

## 2020-07-30 DIAGNOSIS — T466X5A Adverse effect of antihyperlipidemic and antiarteriosclerotic drugs, initial encounter: Secondary | ICD-10-CM | POA: Insufficient documentation

## 2020-07-30 NOTE — Assessment & Plan Note (Signed)
LDL remains above goal for secondary prevention. Noted intolerance to rosuvastatin and simvastatin. Currently on ezetimibe 10mg  monotherapy.   We discussed all available therapeutic options including: Nexletol, Nexlizet, PCSK9i (Repatha/Praluent), and Leqvio.  Patient is a good candidate for PCSK9i initiation but unsure to initiate therapy or not. Will like to discuss optopns with spouse prior to making decision.  Patient will call back and let us know if okay to proceed with any of the above options. Plan to repeat fasting lipid after 2 months of new therapy and follow up as needed.

## 2020-07-31 MED ORDER — REPATHA SURECLICK 140 MG/ML ~~LOC~~ SOAJ: 140.0000 mg | SUBCUTANEOUS | 11 refills | Status: DC

## 2020-07-31 NOTE — Telephone Encounter (Signed)
Called and spoke w/pt regarding the approval of repatha 140mg  rx sent, pt instructed to complete fastig lipids and lft after 4th shot or 2 months. Pt voiced understanding and we were able to get the healthwell grant approved and emailed a copy to the pt.

## 2020-07-31 NOTE — Addendum Note (Signed)
Addended by: Allean Found on: 07/31/2020 12:56 PM   Modules accepted: Orders

## 2020-08-30 ENCOUNTER — Other Ambulatory Visit: Payer: Self-pay | Admitting: Obstetrics & Gynecology

## 2020-08-30 DIAGNOSIS — Z1231 Encounter for screening mammogram for malignant neoplasm of breast: Secondary | ICD-10-CM

## 2020-09-05 NOTE — Telephone Encounter (Signed)
Not something we would expect from [place], unless it's making her anxious.  She's scheduled to see you in July, has hx of SVT, not sure how you would want to address the symptoms.

## 2020-09-05 NOTE — Telephone Encounter (Signed)
Spoke with the patient. She was calling in reference to the [place]. She feels like her heart rate has been increasing since starting the Repatha.   Today her heart rate reached 143. It only stays elevated for a minute or two then goes back down. She normally has had this happen occasionally but since starting the Repatha it has worsened. These episodes are making her feel tired and drained.

## 2020-09-05 NOTE — Telephone Encounter (Signed)
Left a message for the patient to call back.  

## 2020-09-06 MED ORDER — METOPROLOL SUCCINATE ER 100 MG PO TB24
100.0000 mg | ORAL_TABLET | Freq: Two times a day (BID) | ORAL | 3 refills | Status: DC
Start: 2020-09-06 — End: 2021-04-23

## 2020-09-06 MED ORDER — METOPROLOL SUCCINATE ER 100 MG PO TB24: 100.0000 mg | ORAL_TABLET | Freq: Two times a day (BID) | ORAL | 3 refills | Status: DC

## 2020-09-06 NOTE — Telephone Encounter (Signed)
Jasmine Wheeler - I spoke with her yesterday and updated the prescription.  You can cross this off your "to do" list

## 2020-09-13 DIAGNOSIS — E785 Hyperlipidemia, unspecified: Secondary | ICD-10-CM | POA: Diagnosis not present

## 2020-09-13 LAB — HEPATIC FUNCTION PANEL
ALT: 10 IU/L (ref 0–32)
AST: 14 IU/L (ref 0–40)
Albumin: 4.2 g/dL (ref 3.7–4.7)
Alkaline Phosphatase: 60 IU/L (ref 44–121)
Bilirubin Total: 0.3 mg/dL (ref 0.0–1.2)
Bilirubin, Direct: 0.11 mg/dL (ref 0.00–0.40)
Total Protein: 5.8 g/dL — ABNORMAL LOW (ref 6.0–8.5)

## 2020-09-13 LAB — LIPID PANEL
Chol/HDL Ratio: 2.1 ratio (ref 0.0–4.4)
Cholesterol, Total: 111 mg/dL (ref 100–199)
HDL: 52 mg/dL (ref >39)
LDL Chol Calc (NIH): 41 mg/dL (ref 0–99)
Triglycerides: 96 mg/dL (ref 0–149)
VLDL Cholesterol Cal: 18 mg/dL (ref 5–40)

## 2020-09-24 ENCOUNTER — Other Ambulatory Visit: Payer: Self-pay | Admitting: Internal Medicine

## 2020-10-05 ENCOUNTER — Telehealth: Payer: Self-pay | Admitting: Internal Medicine

## 2020-10-05 NOTE — Telephone Encounter (Signed)
[facility] pharmacy called her to go over her medicines and they suggested she call Dr Carlean Purl since she has been on her pantoprazole 3 years. She is not having any of the side effects the [facility] Rep said could happen--memory issues or bones breaking. Her GERD is well controlled on the medicine once daily. Her question is do you want her to continue the pantoprazole daily or just use prn? Thank you Sir.

## 2020-10-05 NOTE — Telephone Encounter (Signed)
Inbound call from pt requesting a call back stating that she is on Protonix and she has questions about the after effects for being on it after 2 yrs. Please advise. Thanks.

## 2020-10-05 NOTE — Telephone Encounter (Signed)
No change recommended

## 2020-10-05 NOTE — Telephone Encounter (Signed)
Patient informed. 

## 2020-10-24 ENCOUNTER — Other Ambulatory Visit: Payer: Self-pay

## 2020-10-24 ENCOUNTER — Ambulatory Visit
Admission: RE | Admit: 2020-10-24 | Discharge: 2020-10-24 | Disposition: A | Discharge status: Home / Self Care | Payer: Medicare HMO | Source: Ambulatory Visit | Attending: Obstetrics & Gynecology | Admitting: Obstetrics & Gynecology

## 2020-10-24 DIAGNOSIS — Z1231 Encounter for screening mammogram for malignant neoplasm of breast: Secondary | ICD-10-CM | POA: Diagnosis not present

## 2020-10-25 ENCOUNTER — Other Ambulatory Visit: Payer: Self-pay | Admitting: Adult Health

## 2020-10-25 DIAGNOSIS — E039 Hypothyroidism, unspecified: Secondary | ICD-10-CM

## 2020-10-31 DIAGNOSIS — D2262 Melanocytic nevi of left upper limb, including shoulder: Secondary | ICD-10-CM | POA: Diagnosis not present

## 2020-10-31 DIAGNOSIS — L578 Other skin changes due to chronic exposure to nonionizing radiation: Secondary | ICD-10-CM | POA: Diagnosis not present

## 2020-10-31 DIAGNOSIS — D225 Melanocytic nevi of trunk: Secondary | ICD-10-CM | POA: Diagnosis not present

## 2020-10-31 DIAGNOSIS — L821 Other seborrheic keratosis: Secondary | ICD-10-CM | POA: Diagnosis not present

## 2020-10-31 DIAGNOSIS — D1801 Hemangioma of skin and subcutaneous tissue: Secondary | ICD-10-CM | POA: Diagnosis not present

## 2020-10-31 DIAGNOSIS — L700 Acne vulgaris: Secondary | ICD-10-CM | POA: Diagnosis not present

## 2020-10-31 DIAGNOSIS — D2272 Melanocytic nevi of left lower limb, including hip: Secondary | ICD-10-CM | POA: Diagnosis not present

## 2020-10-31 DIAGNOSIS — Z85820 Personal history of malignant melanoma of skin: Secondary | ICD-10-CM | POA: Diagnosis not present

## 2020-11-09 ENCOUNTER — Ambulatory Visit: Payer: Medicare HMO | Admitting: Cardiovascular Disease

## 2020-11-09 ENCOUNTER — Encounter: Payer: Self-pay | Admitting: Cardiovascular Disease

## 2020-11-09 ENCOUNTER — Other Ambulatory Visit: Payer: Self-pay

## 2020-11-09 ENCOUNTER — Other Ambulatory Visit: Payer: Self-pay | Admitting: Cardiovascular Disease

## 2020-11-09 VITALS — BP 170/92 | HR 60 | Ht 62.0 in | Wt 173.2 lb

## 2020-11-09 DIAGNOSIS — I351 Nonrheumatic aortic (valve) insufficiency: Secondary | ICD-10-CM | POA: Diagnosis not present

## 2020-11-09 DIAGNOSIS — I10 Essential (primary) hypertension: Secondary | ICD-10-CM | POA: Diagnosis not present

## 2020-11-09 DIAGNOSIS — E7800 Pure hypercholesterolemia, unspecified: Secondary | ICD-10-CM

## 2020-11-09 DIAGNOSIS — I7121 Aneurysm of the ascending aorta, without rupture: Secondary | ICD-10-CM

## 2020-11-09 DIAGNOSIS — I712 Thoracic aortic aneurysm, without rupture: Secondary | ICD-10-CM | POA: Diagnosis not present

## 2020-11-09 DIAGNOSIS — I471 Supraventricular tachycardia: Secondary | ICD-10-CM

## 2020-11-09 DIAGNOSIS — I700 Atherosclerosis of aorta: Secondary | ICD-10-CM

## 2020-11-09 DIAGNOSIS — E039 Hypothyroidism, unspecified: Secondary | ICD-10-CM | POA: Diagnosis not present

## 2020-11-09 DIAGNOSIS — N1832 Chronic kidney disease, stage 3b: Secondary | ICD-10-CM | POA: Diagnosis not present

## 2020-11-09 DIAGNOSIS — I453 Trifascicular block: Secondary | ICD-10-CM

## 2020-11-09 NOTE — Patient Instructions (Signed)
Medication Instructions:  STOP the Ezetimibe (Zetia)  *If you need a refill on your cardiac medications before your next appointment, please call your pharmacy*   Lab Work: None ordered If you have labs (blood work) drawn today and your tests are completely normal, you will receive your results only by: [facility] (if you have MyChart) OR A paper copy in the mail If you have any lab test that is abnormal or we need to change your treatment, we will call you to review the results.   Testing/Procedures: None ordered   Follow-Up: At [facility], you and your health needs are our priority.  As part of our continuing mission to provide you with exceptional heart care, we have created designated Provider Care Teams.  These Care Teams include your primary Cardiologist (physician) and Advanced Practice Providers (APPs -  Physician Assistants and Nurse Practitioners) who all work together to provide you with the care you need, when you need it.  We recommend signing up for the patient portal called "MyChart".  Sign up information is provided on this After Visit Summary.  MyChart is used to connect with patients for Virtual Visits (Telemedicine).  Patients are able to view lab/test results, encounter notes, upcoming appointments, etc.  Non-urgent messages can be sent to your provider as well.   To learn more about what you can do with MyChart, go to [email or web address].    Your next appointment:   12 month(s)  The format for your next appointment:   In Person  Provider:   You may see Sanda Klein, MD or one of the following Advanced Practice Providers on your designated Care Team:   Almyra Deforest, PA-C Fabian Sharp, PA-C or  Roby Lofts, [place]

## 2020-11-09 NOTE — Progress Notes (Signed)
Cardiology office note:    Date:  11/10/2020   ID:  Trinetta, Freytes [birth year], MRN [id number]  PCP:  Dorothyann Peng, NP  Cardiologist:  Sanda Klein, MD  Electrophysiologist:  None   Referring MD: Stevie Kern, MD  Chief Complaint  Patient presents with   Irregular Heart Beat    History of Present Illness:    AMANNI OSTHOFF is a 75 y.o. 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  The palpitations only bother her infrequently and are always brief.  She never has sustained palpitations.  She has had a lot of problems with cramps in her leg muscles while on statin, but these are improved after switching to Repatha and now cramps occur only at night if she stretches in a certain direction.  She has not had problems with angina or dyspnea either at rest or with activity, edema, orthopnea, PND, intermittent claudication or neurological complaints.  Her blood pressure is consistently in the 100-1 110s/70s at home although it is high today.  In the past she has had a right bundle branch block, probably rate related.  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  Her nephrologist is Dr. Pearson Grippe.  Her creatinine has been stable, identical with last year at 1.53.  On Repatha her most recent LDL cholesterol was 41.  CT angiography of the chest performed in November 2021 showed a maximum aortic diameter 42 mm at the level of the ascending aorta, unchanged from previous evaluation.  She has a history of dysphagia and has previously undergone esophagoplasty and is on chronic treatment with proton pump inhibitors.  Because of a heart murmur she underwent an echocardiogram in 2016.  The echo showed normal left ventricular systolic function with an  ejection fraction of 60-65%, left ventricular hypertrophy with mild diastolic dysfunction, a dilated ascending aorta of 44 mm with trivial aortic valve insufficiency.  A follow-up echo that we performed in 2019 shows no significant interval change.  A CT angiogram of the chest performed in October 2020 confirmed that she has a dilated ascending aorta, but the maximum diameter was only 4.0 cm.  Aortic atherosclerosis was reported. .  Past Medical History:  Diagnosis Date   Abnormality of aortic valve    enlarged    Adenomatous colon polyp    Allergy    Arthritis    back    Asthma    Benign gastric polyp - hyperplastic 09/2017   Cancer ([place])    Melanoma left arm    Cataract    Chronic kidney disease    CKD III   Chronic low back pain    Diverticulosis 01/23/2010   left colon   GERD (gastroesophageal reflux disease)    Heart murmur 2016   Hyperlipidemia    Hypertension    Hypothyroidism    IBS (irritable bowel syndrome)    Osteopenia    SVT (supraventricular tachycardia) ([place])     Past Surgical History:  Procedure Laterality Date   ABDOMINAL HYSTERECTOMY     COLON SURGERY  2011   hemicolectomy for a TA polyp   COLONOSCOPY     COLONOSCOPY W/ BIOPSIES  multiple   COSMETIC SURGERY     from   [place]  02/15/10   right, tubulovillous adenoma appendix, Dr.  Tsuei   HEMORRHOID BANDING  2014   LUMBAR EPIDURAL INJECTION  2011   x 2   MELANOMA EXCISION Left 2012   left arm, not sure about lymph nodes   POLYPECTOMY     REPEAT CESAREAN SECTION     3 in all, last [facility]     x2    Current Medications: Current Meds  Medication Sig   albuterol (VENTOLIN HFA) 108 (90 Base) MCG/ACT inhaler INHALE 2 PUFFS EVERY SIX HOURS AS NEEDED FOR WHEEZING OR SHORTNESS OF BREATH   Ascorbic Acid (VITAMIN C) 500 MG tablet daily. Take 2 tablet daily   aspirin 81 MG EC tablet Take 1 tablet (81 mg total) by mouth daily with breakfast.   Calcium Carbonate-Vitamin D  (CALCIUM-VITAMIN D) 500-200 MG-UNIT per tablet Take 1 tablet by mouth 2 (two) times daily with a meal.   cetirizine (ZYRTEC) 10 MG tablet Take 10 mg by mouth daily.   Cranberry 1000 MG CAPS Take by mouth. Take 2 tablets daily   Evolocumab (REPATHA SURECLICK) [id number] MG/ML SOAJ Inject 140 mg into the skin every 14 (fourteen) days.   fluticasone (FLONASE) 50 MCG/ACT nasal spray USE 2 SPRAYS INTO BOTH NOSTRILS DAILY.   Melatonin 5 MG CAPS Take by mouth at bedtime.   metoprolol succinate (TOPROL-XL) 100 MG 24 hr tablet Take 1 tablet (100 mg total) by mouth in the morning and at bedtime. Take with or immediately following a meal.   Multiple Vitamin (MULTIVITAMIN) tablet Take 1 tablet by mouth daily.   pantoprazole (PROTONIX) 40 MG tablet TAKE 1 TABLET EVERY MORNING (NEED MD APPOINTMENT, PLEASE CALL FOR APPOINTMENT)   Peppermint Oil 90 MG CPCR Take 2 capsules by mouth 2 (two) times daily.   quinapril (ACCUPRIL) 40 MG tablet Take 1 tablet (40 mg total) by mouth 2 (two) times daily.   spironolactone (ALDACTONE) 25 MG tablet TAKE 1 TABLET EVERY DAY   SYNTHROID 100 MCG tablet TAKE 1 TABLET EVERY MORNING BEFORE BREAKFAST   triamterene-hydrochlorothiazide (MAXZIDE-25) 37.5-25 MG tablet TAKE 1/2 TABLET EVERY DAY   vitamin B-12 (CYANOCOBALAMIN) 500 MCG tablet Take 500 mcg by mouth daily.   [DISCONTINUED] ezetimibe (ZETIA) 10 MG tablet Take 1 tablet (10 mg total) by mouth daily.     Allergies:   Dicyclomine, Nifedipine, Amlodipine, Rosuvastatin, Simvastatin, and Theophylline   Social History   Socioeconomic History   Marital status: Married    Spouse name: Not on file   Number of children: 3   Years of education: Not on file   Highest education level: Not on file  Occupational History   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MGR    Employer: [employer]  Tobacco Use   Smoking status: Never   Smokeless tobacco: Never  Vaping Use   Vaping Use: Never used  Substance and Sexual Activity   Alcohol use: No   Drug use: No    Sexual activity: Not on file  Other Topics Concern   Not on file  Social History Narrative   Married retired lives in [place]   No alcohol tobacco or drug use   Caffeine is 2 cups of coffee daily   3 children   Elderly mother lives with her and requires some care    Social Determinants of Health   Financial Resource Strain: Low Risk    Difficulty of Paying Living Expenses: Not hard at all  Food Insecurity: No Food Insecurity   Worried About Charity fundraiser in the Last Year: Never true  Ran Out of Food in the Last Year: Never true  Transportation Needs: No Transportation Needs   Lack of Transportation (Medical): No   Lack of Transportation (Non-Medical): No  Physical Activity: Inactive   Days of Exercise per Week: 0 days   Minutes of Exercise per Session: 0 min  Stress: No Stress Concern Present   Feeling of Stress : Not at all  Social Connections: Moderately Integrated   Frequency of Communication with Friends and Family: More than three times a week   Frequency of Social Gatherings with Friends and Family: More than three times a week   Attends Religious Services: More than 4 times per year   Active Member of [employer] or Organizations: No   Attends Music therapist: Never   Marital Status: Married     Family History: The patient's family history includes Cervical cancer in her sister; Colon cancer in her maternal uncle and maternal uncle; Colon cancer (age of onset: 63) in her mother; Heart disease in her father; Lung cancer in her father. There is no history of Colon polyps, Esophageal cancer, Rectal cancer, or Stomach cancer.  ROS:   Please see the history of present illness.    All other systems are reviewed and are negative.   EKGs/Labs/Other Studies Reviewed:    The following studies were reviewed today:   ECHO 03/13/2019:    Result status: Final result                    1. Left ventricular ejection fraction, by visual estimation, is 60  to 65%. The left ventricle has normal function. There is moderately increased left ventricular hypertrophy.  2. Left ventricular diastolic parameters are consistent with Grade I diastolic dysfunction (impaired relaxation).  3. Global right ventricle has normal systolic function.The right ventricular size is normal. No increase in right ventricular wall thickness.  4. Left atrial size was mildly dilated.  5. Right atrial size was normal.  6. Mild aortic valve annular calcification.  7. The mitral valve is normal in structure. Trace mitral valve regurgitation. No evidence of mitral stenosis.  8. The tricuspid valve is normal in structure. Tricuspid valve regurgitation is trivial.  9. The aortic valve is tricuspid. Aortic valve regurgitation is mild. No evidence of aortic valve sclerosis or stenosis. 10. There is Mild calcification of the aortic valve. 11. There is Mild thickening of the aortic valve. 12. The pulmonic valve was not well visualized. Pulmonic valve regurgitation is not visualized. 13. There is mild dilatation of the ascending aorta measuring 39 mm. 14. Mildly elevated pulmonary artery systolic pressure. 15. The inferior vena cava is normal in size with greater than 50% respiratory variability, suggesting right atrial pressure of 3 mmHg.   CT angio of the chest February 23, 2020 1. Stable uncomplicated fusiform aneurysmal dilatation of the ascending thoracic aorta measuring 42 mm in diameter, unchanged compared to the 01/2019 examination. Recommend annual imaging followup by CTA or MRA. This recommendation follows 2010 ACCF/AHA/AATS/ACR/ASA/SCA/SCAI/SIR/STS/SVM Guidelines for the Diagnosis and Management of Patients with Thoracic Aortic Disease. Circulation. 2010; [id number]. Aortic aneurysm NOS (ICD10-I71.9) 2. Cardiomegaly with borderline enlargement of the caliber the main pulmonary artery and suspected calcifications within the aortic valve leaflets. Further evaluation  with cardiac echo could be performed as clinically indicated. 3. Moderate amount of atherosclerotic plaque within a normal caliber descending thoracic aorta, not resulting in a hemodynamically significant stenosis. Aortic Atherosclerosis (ICD10-I70.0). 4. Sequela of previous granulomatous infection as above.  EKG:  EKG is ordered today and personally reviewed, very similar to last year's tracing, sinus rhythm with first-degree AV block and left anterior fascicular block without ischemic repolarization abnormalities.  QTc 418 ms   The tracings from 03/12/2019 showed narrow complex short RP tachycardia in the 150s with pseudo-r' in V1 highly suggestive of AV node reentry.  Recent Labs: 04/27/2020: Magnesium 2.2 06/06/2020: BUN 34; Creatinine, Ser 1.53; Potassium 4.6; Sodium 138; TSH 0.99 07/04/2020: Hemoglobin 10.1; Platelets 188.0 09/13/2020: ALT 10  Recent Lipid Panel    Component Value Date/Time   CHOL 111 09/13/2020 1032   TRIG 96 09/13/2020 1032   TRIG 59 02/12/2006 1105   HDL 52 09/13/2020 1032   CHOLHDL 2.1 09/13/2020 1032   CHOLHDL 4 06/06/2020 0952   VLDL 32.2 06/06/2020 0952   LDLCALC 41 09/13/2020 1032    Physical Exam:    VS:  BP (!) 170/92 (BP Location: Left Arm, Patient Position: Sitting, Cuff Size: Large)   Pulse 60   Ht '5\' 2"'$  ([id number] m)   Wt 173 lb 3.2 oz (78.6 kg)   SpO2 96%   BMI 31.68 kg/m     Wt Readings from Last 3 Encounters:  11/09/20 173 lb 3.2 oz (78.6 kg)  07/24/20 173 lb (78.5 kg)  06/06/20 170 lb (77.1 kg)      General: Alert, oriented x3, no distress, mildly obese Head: no evidence of trauma, PERRL, EOMI, no exophtalmos or lid lag, no myxedema, no xanthelasma; normal ears, nose and oropharynx Neck: normal jugular venous pulsations and no hepatojugular reflux; brisk carotid pulses without delay and no carotid bruits Chest: clear to auscultation, no signs of consolidation by percussion or palpation, normal fremitus, symmetrical and full respiratory  excursions Cardiovascular: normal position and quality of the apical impulse, regular rhythm, normal first and second heart sounds, grade 1/6 decrescendo murmur at the left lower sternal border, no systolic murmurs, rubs or gallops Abdomen: no tenderness or distention, no masses by palpation, no abnormal pulsatility or arterial bruits, normal bowel sounds, no hepatosplenomegaly Extremities: no clubbing, cyanosis or edema; 2+ radial, ulnar and brachial pulses bilaterally; 2+ right femoral, posterior tibial and dorsalis pedis pulses; 2+ left femoral, posterior tibial and dorsalis pedis pulses; no subclavian or femoral bruits Neurological: grossly nonfocal Psych: Normal mood and affect    ASSESSMENT:    1. SVT (supraventricular tachycardia) ([place])   2. Nonrheumatic aortic valve insufficiency   3. Ascending aortic aneurysm ([place])   4. Trifascicular block   5. Essential hypertension   6. Atherosclerosis of aorta ([place])   7. Hypercholesterolemia   8. Stage 3b chronic kidney disease ([place])   9. 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  She has not had any recurrent sustained events on the current dose of beta-blocker.  Need to be cautious with higher doses of beta-blocker since she also has evidence of conduction system disease with first-degree AV block and left anterior fascicular block and rate related bundle branch block.  On some prior EKGs she had left posterior fascicular block.  May require AV nodal and slow pathway ablation and/or pacemaker implantation in the future.  AI: Mild, secondary to aortic annular ectasia. AscAoAneur: Stable in size.  The transthoracic echo measurements correlate reasonably well with a CT.  Would favor using this as our primary way of monitoring to avoid contrast exposure since she has CKD.  We will only check a CT every 3-5 years.  1st deg AVB, RBBB+LAFB/LPFB: Avoid combination  beta-blocker and centrally acting calcium channel blocker, which caused symptoms of bradycardia in the past.  So far without symptoms to suggest high-grade AV block. HTN: She reports systolic blood pressure in the 100-1 110s at home.  No changes made to her medications today.  She is on maximum dose ACE inhibitor as well as 3 different diuretics and a fairly hefty dose of beta-blocker.  She was poorly tolerant of amlodipine Aortic atherosclerosis: Noted on CT chest HLP: Had significant statin myopathy (with both rosuvastatin and simvastatin), but tolerating Repatha very well.  LDL at target. Stop the zetia. CKD 3: Stable compared to last year.  Sees Dr. Joelyn Oms at [facility]. Hypothyroidism: TSH earlier this year in normal range.  No symptoms of hypo or hyperthyroidism.  She did not do well on generic levothyroxine and needs to take Synthroid.  Medication Adjustments/Labs and Tests Ordered: Current medicines are reviewed at length with the patient today.  Concerns regarding medicines are outlined above.  Orders Placed This Encounter  Procedures   EKG 12-Lead    Patient Instructions  Medication Instructions:  STOP the Ezetimibe (Zetia)  *If you need a refill on your cardiac medications before your next appointment, please call your pharmacy*   Lab Work: None ordered If you have labs (blood work) drawn today and your tests are completely normal, you will receive your results only by: [place] (if you have MyChart) OR A paper copy in the mail If you have any lab test that is abnormal or we need to change your treatment, we will call you to review the results.   Testing/Procedures: None ordered   Follow-Up: At [facility], you and your health needs are our priority.  As part of our continuing mission to provide you with exceptional heart care, we have created designated Provider Care Teams.  These Care Teams include your primary Cardiologist (physician) and Advanced  Practice Providers (APPs -  Physician Assistants and Nurse Practitioners) who all work together to provide you with the care you need, when you need it.  We recommend signing up for the patient portal called "MyChart".  Sign up information is provided on this After Visit Summary.  MyChart is used to connect with patients for Virtual Visits (Telemedicine).  Patients are able to view lab/test results, encounter notes, upcoming appointments, etc.  Non-urgent messages can be sent to your provider as well.   To learn more about what you can do with MyChart, go to [email or web address].    Your next appointment:   12 month(s)  The format for your next appointment:   In Person  Provider:   You may see Sanda Klein, MD or one of the following Advanced Practice Providers on your designated Care Team:   Almyra Deforest, PA-C Fabian Sharp, [place] or  Roby Lofts, PA-C     Signed, Sanda Klein, MD  11/10/2020 3:18 PM    [place]

## 2020-11-10 ENCOUNTER — Encounter: Payer: Self-pay | Admitting: Cardiovascular Disease

## 2020-11-26 ENCOUNTER — Other Ambulatory Visit: Payer: Self-pay | Admitting: Adult Health

## 2020-11-27 ENCOUNTER — Ambulatory Visit: Payer: Medicare HMO | Admitting: Adult Health

## 2020-11-27 ENCOUNTER — Other Ambulatory Visit (INDEPENDENT_AMBULATORY_CARE_PROVIDER_SITE_OTHER): Payer: Medicare HMO

## 2020-11-27 ENCOUNTER — Other Ambulatory Visit: Payer: Self-pay

## 2020-11-27 DIAGNOSIS — E039 Hypothyroidism, unspecified: Secondary | ICD-10-CM | POA: Diagnosis not present

## 2020-11-27 LAB — TSH: TSH: 1.05 u[IU]/mL (ref 0.35–5.50)

## 2020-12-12 ENCOUNTER — Ambulatory Visit (INDEPENDENT_AMBULATORY_CARE_PROVIDER_SITE_OTHER): Payer: Medicare HMO

## 2020-12-12 DIAGNOSIS — Z0000 Encounter for general adult medical examination without abnormal findings: Secondary | ICD-10-CM

## 2020-12-12 NOTE — Patient Instructions (Signed)
[name] , Thank you for taking time to come for your Medicare Wellness Visit. I appreciate your ongoing commitment to your health goals. Please review the following plan we discussed and let me know if I can assist you in the future.   Screening recommendations/referrals: Colonoscopy: 02/03/2020  due 01/2021 pt will call to schedule  Mammogram: 10/24/2020 Bone Density: 10/13/2019 Recommended yearly ophthalmology/optometry visit for glaucoma screening and checkup Recommended yearly dental visit for hygiene and checkup  Vaccinations: Influenza vaccine: due fall 2022  Pneumococcal vaccine: completed series  Tdap vaccine: 04/09/2020 Shingles vaccine: completed series     Advanced directives: none   Conditions/risks identified: none   Next appointment: none    Preventive Care 29 Years and Older, Female Preventive care refers to lifestyle choices and visits with your health care provider that can promote health and wellness. What does preventive care include? A yearly physical exam. This is also called an annual well check. Dental exams once or twice a year. Routine eye exams. Ask your health care provider how often you should have your eyes checked. Personal lifestyle choices, including: Daily care of your teeth and gums. Regular physical activity. Eating a healthy diet. Avoiding tobacco and drug use. Limiting alcohol use. Practicing safe sex. Taking low-dose aspirin every day. Taking vitamin and mineral supplements as recommended by your health care provider. What happens during an annual well check? The services and screenings done by your health care provider during your annual well check will depend on your age, overall health, lifestyle risk factors, and family history of disease. Counseling  Your health care provider may ask you questions about your: Alcohol use. Tobacco use. Drug use. Emotional well-being. Home and relationship well-being. Sexual activity. Eating  habits. History of falls. Memory and ability to understand (cognition). Work and work Statistician. Reproductive health. Screening  You may have the following tests or measurements: Height, weight, and BMI. Blood pressure. Lipid and cholesterol levels. These may be checked every 5 years, or more frequently if you are over 54 years old. Skin check. Lung cancer screening. You may have this screening every year starting at age 34 if you have a 30-pack-year history of smoking and currently smoke or have quit within the past 15 years. Fecal occult blood test (FOBT) of the stool. You may have this test every year starting at age 17. Flexible sigmoidoscopy or colonoscopy. You may have a sigmoidoscopy every 5 years or a colonoscopy every 10 years starting at age 50. Hepatitis C blood test. Hepatitis B blood test. Sexually transmitted disease (STD) testing. Diabetes screening. This is done by checking your blood sugar (glucose) after you have not eaten for a while (fasting). You may have this done every 1-3 years. Bone density scan. This is done to screen for osteoporosis. You may have this done starting at age 57. Mammogram. This may be done every 1-2 years. Talk to your health care provider about how often you should have regular mammograms. Talk with your health care provider about your test results, treatment options, and if necessary, the need for more tests. Vaccines  Your health care provider may recommend certain vaccines, such as: Influenza vaccine. This is recommended every year. Tetanus, diphtheria, and acellular pertussis (Tdap, Td) vaccine. You may need a Td booster every 10 years. Zoster vaccine. You may need this after age 61. Pneumococcal 13-valent conjugate (PCV13) vaccine. One dose is recommended after age 17. Pneumococcal polysaccharide (PPSV23) vaccine. One dose is recommended after age 37. Talk to your health  care provider about which screenings and vaccines you need and how  often you need them. This information is not intended to replace advice given to you by your health care provider. Make sure you discuss any questions you have with your health care provider. Document Released: 05/04/2015 Document Revised: 12/26/2015 Document Reviewed: 02/06/2015 Elsevier Interactive Patient Education  2017 [place] Prevention in the Home Falls can cause injuries. They can happen to people of all ages. There are many things you can do to make your home safe and to help prevent falls. What can I do on the outside of my home? Regularly fix the edges of walkways and driveways and fix any cracks. Remove anything that might make you trip as you walk through a door, such as a raised step or threshold. Trim any bushes or trees on the path to your home. Use bright outdoor lighting. Clear any walking paths of anything that might make someone trip, such as rocks or tools. Regularly check to see if handrails are loose or broken. Make sure that both sides of any steps have handrails. Any raised decks and porches should have guardrails on the edges. Have any leaves, snow, or ice cleared regularly. Use sand or salt on walking paths during winter. Clean up any spills in your garage right away. This includes oil or grease spills. What can I do in the bathroom? Use night lights. Install grab bars by the toilet and in the tub and shower. Do not use towel bars as grab bars. Use non-skid mats or decals in the tub or shower. If you need to sit down in the shower, use a plastic, non-slip stool. Keep the floor dry. Clean up any water that spills on the floor as soon as it happens. Remove soap buildup in the tub or shower regularly. Attach bath mats securely with double-sided non-slip rug tape. Do not have throw rugs and other things on the floor that can make you trip. What can I do in the bedroom? Use night lights. Make sure that you have a light by your bed that is easy to  reach. Do not use any sheets or blankets that are too big for your bed. They should not hang down onto the floor. Have a firm chair that has side arms. You can use this for support while you get dressed. Do not have throw rugs and other things on the floor that can make you trip. What can I do in the kitchen? Clean up any spills right away. Avoid walking on wet floors. Keep items that you use a lot in easy-to-reach places. If you need to reach something above you, use a strong step stool that has a grab bar. Keep electrical cords out of the way. Do not use floor polish or wax that makes floors slippery. If you must use wax, use non-skid floor wax. Do not have throw rugs and other things on the floor that can make you trip. What can I do with my stairs? Do not leave any items on the stairs. Make sure that there are handrails on both sides of the stairs and use them. Fix handrails that are broken or loose. Make sure that handrails are as long as the stairways. Check any carpeting to make sure that it is firmly attached to the stairs. Fix any carpet that is loose or worn. Avoid having throw rugs at the top or bottom of the stairs. If you do have throw rugs, attach them to  the floor with carpet tape. Make sure that you have a light switch at the top of the stairs and the bottom of the stairs. If you do not have them, ask someone to add them for you. What else can I do to help prevent falls? Wear shoes that: Do not have high heels. Have rubber bottoms. Are comfortable and fit you well. Are closed at the toe. Do not wear sandals. If you use a stepladder: Make sure that it is fully opened. Do not climb a closed stepladder. Make sure that both sides of the stepladder are locked into place. Ask someone to hold it for you, if possible. Clearly mark and make sure that you can see: Any grab bars or handrails. First and last steps. Where the edge of each step is. Use tools that help you move  around (mobility aids) if they are needed. These include: Canes. Walkers. Scooters. Crutches. Turn on the lights when you go into a dark area. Replace any light bulbs as soon as they burn out. Set up your furniture so you have a clear path. Avoid moving your furniture around. If any of your floors are uneven, fix them. If there are any pets around you, be aware of where they are. Review your medicines with your doctor. Some medicines can make you feel dizzy. This can increase your chance of falling. Ask your doctor what other things that you can do to help prevent falls. This information is not intended to replace advice given to you by your health care provider. Make sure you discuss any questions you have with your health care provider. Document Released: 02/01/2009 Document Revised: 09/13/2015 Document Reviewed: 05/12/2014 Elsevier Interactive Patient Education  2017 [employer].
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
## 2021-01-11 ENCOUNTER — Other Ambulatory Visit: Payer: Self-pay | Admitting: Cardiovascular Disease

## 2021-01-11 DIAGNOSIS — H2513 Age-related nuclear cataract, bilateral: Secondary | ICD-10-CM | POA: Diagnosis not present

## 2021-01-17 ENCOUNTER — Other Ambulatory Visit: Payer: Self-pay

## 2021-01-17 ENCOUNTER — Ambulatory Visit (INDEPENDENT_AMBULATORY_CARE_PROVIDER_SITE_OTHER): Payer: Medicare HMO

## 2021-01-17 DIAGNOSIS — Z23 Encounter for immunization: Secondary | ICD-10-CM

## 2021-01-21 ENCOUNTER — Ambulatory Visit: Payer: Medicare HMO

## 2021-02-14 ENCOUNTER — Telehealth: Payer: Self-pay | Admitting: Internal Medicine

## 2021-02-14 DIAGNOSIS — Z86010 Personal history of colonic polyps: Secondary | ICD-10-CM

## 2021-02-14 NOTE — Telephone Encounter (Signed)
Pt states that she was told at her last colonoscopy that she was going to another Colonoscopy in 1 year. Pt does have a recall in Epic although pt states she never received a letter. Pt states that she had a hx of polyps and a family hx of Colon CA.  Pt states that she does have a heart arrhythmia although she only take a baby asprin.  Please advise

## 2021-02-14 NOTE — Telephone Encounter (Signed)
Inbound call from patient stating she was told to repeat colon in a year and is wanting to talk with a nurse to discuss further.  Please advise.

## 2021-02-14 NOTE — Telephone Encounter (Signed)
She is correct that she is due about now.  Please explain to her that I am a month or so behind on the recalls.  We should do this before the end of the year so go ahead and set her up for a direct colonoscopy.

## 2021-02-15 NOTE — Telephone Encounter (Signed)
Pt scheduled for a previsit 04/02/2021 @ 4:00 PM Pt scheduled for a colonoscopy with Dr. Carlean Purl on 04/08/2021 @ 1:30  Pt made aware of appointments Pt verbalized understanding with all questions answered.

## 2021-02-27 ENCOUNTER — Other Ambulatory Visit: Payer: Self-pay | Admitting: Cardiovascular Disease

## 2021-03-24 ENCOUNTER — Other Ambulatory Visit: Payer: Self-pay | Admitting: Adult Health

## 2021-04-02 ENCOUNTER — Ambulatory Visit (AMBULATORY_SURGERY_CENTER): Payer: Medicare HMO | Admitting: *Deleted

## 2021-04-02 ENCOUNTER — Other Ambulatory Visit: Payer: Self-pay

## 2021-04-02 VITALS — Ht 62.0 in | Wt 173.0 lb

## 2021-04-02 DIAGNOSIS — Z86010 Personal history of colonic polyps: Secondary | ICD-10-CM

## 2021-04-02 MED ORDER — NA SULFATE-K SULFATE-MG SULF 17.5-3.13-1.6 GM/177ML PO SOLN: 1.0000 | Freq: Once | ORAL | 0 refills | Status: AC

## 2021-04-02 NOTE — Progress Notes (Signed)
No egg or soy allergy known to patient  No issues known to pt with past sedation with any surgeries or procedures Patient denies ever being told they had issues or difficulty with intubation  No FH of Malignant Hyperthermia Pt is not on diet pills Pt is not on  home 02  Pt is not on blood thinners  Pt denies issues with constipation  No A fib or A flutter  Pt is not vaccinated  for Covid     NO PA's for preps discussed with pt In PV today  Discussed with pt there will be an out-of-pocket cost for prep and that varies from $0 to 70 +  dollars - pt verbalized understanding   Due to the COVID-19 pandemic we are asking patients to follow certain guidelines in PV and the [facility]   Pt aware of COVID protocols and [facility] guidelines

## 2021-04-06 ENCOUNTER — Telehealth: Payer: Self-pay | Admitting: Physician Assistant

## 2021-04-06 NOTE — Telephone Encounter (Signed)
Hi.  Pt left message w ans service asking to cxl 12/19 colonoscopy because her husband has Covid.  She plans to call back soon and reschedule.    Azucena Freed PA-C

## 2021-04-08 ENCOUNTER — Telehealth: Payer: Self-pay | Admitting: Internal Medicine

## 2021-04-08 ENCOUNTER — Encounter: Payer: Medicare HMO | Admitting: Internal Medicine

## 2021-04-08 NOTE — Telephone Encounter (Signed)
Noted and no charge

## 2021-04-08 NOTE — Telephone Encounter (Signed)
Patient called this morning stating her and her husband both have COVID.  She called on Saturday to try to let us know but was unable to cancel the appointment at that time.  She has rescheduled for 05/13/21 at 4:00 p.m.  Thank you.

## 2021-04-09 ENCOUNTER — Telehealth: Payer: Self-pay

## 2021-04-09 ENCOUNTER — Encounter: Payer: Self-pay | Admitting: Cardiovascular Disease

## 2021-04-09 NOTE — Telephone Encounter (Signed)
Spoke with patient, see chart.    

## 2021-04-09 NOTE — Telephone Encounter (Signed)
Spoke to pt, she has Covid. She has been congested and wants to know what she can take, she was made aware she can take Coricidin since she does have high blood pressure. She was advised to make sure she drinks fluids too to help thin the secretions. She verbalized understanding and she will have her daughter pick some up for her.

## 2021-04-23 ENCOUNTER — Other Ambulatory Visit: Payer: Self-pay | Admitting: Cardiovascular Disease

## 2021-04-25 ENCOUNTER — Encounter: Payer: Self-pay | Admitting: Adult Health

## 2021-04-25 ENCOUNTER — Telehealth: Payer: Self-pay | Admitting: Adult Health

## 2021-04-25 ENCOUNTER — Ambulatory Visit (INDEPENDENT_AMBULATORY_CARE_PROVIDER_SITE_OTHER): Payer: Medicare HMO | Admitting: Adult Health

## 2021-04-25 VITALS — BP 142/80 | HR 69 | Temp 98.3°F | Ht 62.0 in | Wt 176.0 lb

## 2021-04-25 DIAGNOSIS — R300 Dysuria: Secondary | ICD-10-CM

## 2021-04-25 LAB — POCT URINALYSIS DIPSTICK
Bilirubin, UA: NEGATIVE
Blood, UA: POSITIVE
Glucose, UA: NEGATIVE
Ketones, UA: NEGATIVE
Nitrite, UA: NEGATIVE
Protein, UA: POSITIVE — AB
Spec Grav, UA: 1.01 (ref 1.010–1.025)
Urobilinogen, UA: 0.2 E.U./dL
pH, UA: 6 (ref 5.0–8.0)

## 2021-04-25 MED ORDER — NITROFURANTOIN MONOHYD MACRO 100 MG PO CAPS: 100.0000 mg | ORAL_CAPSULE | Freq: Two times a day (BID) | ORAL | 0 refills | Status: DC

## 2021-04-25 NOTE — Telephone Encounter (Signed)
Pt has an in office appt. Will check urine at the scheduled time

## 2021-04-25 NOTE — Progress Notes (Signed)
Subjective:    Patient ID: Jasmine Wheeler, female    DOB: 03/24/1946, 76 y.o.   MRN: [id number]  HPI 76 year old female who  has a past medical history of Abnormality of aortic valve, Adenomatous colon polyp, Allergy, Arthritis, Asthma, Benign gastric polyp - hyperplastic (09/2017), Cancer ([place]), Cataract, Chronic kidney disease, Chronic low back pain, Diverticulosis (01/23/2010), GERD (gastroesophageal reflux disease), Heart murmur (2016), Hyperlipidemia, Hypertension, Hypothyroidism, IBS (irritable bowel syndrome), Osteopenia, Spinal stenosis, and SVT (supraventricular tachycardia) ([place]).  She presents to the office today for concern of a UTI. Her symptoms started one day ago. Symptoms include dysuria, hematuria and urinary urgency and frequency.   She denies fevers, chills, low back pain or pelvic pain.   Review of Systems See HPI   Past Medical History:  Diagnosis Date   Abnormality of aortic valve    enlarged    Adenomatous colon polyp    Allergy    Arthritis    back    Asthma    Benign gastric polyp - hyperplastic 09/2017   Cancer ([place])    Melanoma left arm    Cataract    forming   Chronic kidney disease    CKD III   Chronic low back pain    Diverticulosis 01/23/2010   left colon   GERD (gastroesophageal reflux disease)    Heart murmur 2016   Hyperlipidemia    controlled with Repatha   Hypertension    Hypothyroidism    IBS (irritable bowel syndrome)    Osteopenia    Spinal stenosis    SVT (supraventricular tachycardia) ([place])     Social History   Socioeconomic History   Marital status: Married    Spouse name: Not on file   Number of children: 3   Years of education: Not on file   Highest education level: Not on file  Occupational History   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[employer]    Employer: [employer]  Tobacco Use   Smoking status: Never   Smokeless tobacco: Never  Vaping Use   Vaping Use: Never used  Substance and Sexual Activity   Alcohol use: No   Drug  use: No   Sexual activity: Not on file  Other Topics Concern   Not on file  Social History Narrative   Married retired lives in [place]   No alcohol tobacco or drug use   Caffeine is 2 cups of coffee daily   3 children   Elderly mother lives with her and requires some care    Social Determinants of Health   Financial Resource Strain: Low Risk    Difficulty of Paying Living Expenses: Not hard at all  Food Insecurity: No Food Insecurity   Worried About Charity fundraiser in the Last Year: Never true   Arboriculturist in the Last Year: Never true  Transportation Needs: No Transportation Needs   Lack of Transportation (Medical): No   Lack of Transportation (Non-Medical): No  Physical Activity: Insufficiently Active   Days of Exercise per Week: 3 days   Minutes of Exercise per Session: 30 min  Stress: No Stress Concern Present   Feeling of Stress : Not at all  Social Connections: Moderately Integrated   Frequency of Communication with Friends and Family: Three times a week   Frequency of Social Gatherings with Friends and Family: Three times a week   Attends Religious Services: More than 4 times per year   Active Member of Clubs or Organizations: No  Attends Archivist Meetings: Never   Marital Status: Married  Human resources officer Violence: Not At Risk   Fear of Current or Ex-Partner: No   Emotionally Abused: No   Physically Abused: No   Sexually Abused: No    Past Surgical History:  Procedure Laterality Date   ABDOMINAL HYSTERECTOMY     COLON SURGERY  2011   hemicolectomy for a TA polyp   COLONOSCOPY     COLONOSCOPY W/ BIOPSIES  multiple   COSMETIC SURGERY     from   [place]  02/15/2010   right, tubulovillous adenoma appendix, Dr. Georgette Dover   HEMORRHOID BANDING  2014   [place]  2011   x 2   MELANOMA EXCISION Left 2012   left arm, not sure about lymph nodes   POLYPECTOMY     REPEAT CESAREAN SECTION     3 in all, last 1973    SHOULDER SURGERY Bilateral    x2    Family History  Problem Relation Age of Onset   Colon cancer Mother 35   Kidney cancer Mother    Lung cancer Father    Heart disease Father    Cervical cancer Sister    Colon cancer Maternal Uncle    Colon cancer Maternal Uncle    Colon polyps Neg Hx    Esophageal cancer Neg Hx    Rectal cancer Neg Hx    Stomach cancer Neg Hx     Allergies  Allergen Reactions   Dicyclomine Nausea Only and Other (See Comments)    Nausea, tingling in her whole body, couldn't sleep, made lights bright   Nifedipine Other (See Comments)    Dropped blood pressure and heart rate too much   Amlodipine     Dizzy and feel like almost passing out   Rosuvastatin     MYALGIA   Simvastatin     MYALGIA   Theophylline Other (See Comments)    Patient cannot recall reaction    Current Outpatient Medications on File Prior to Visit  Medication Sig Dispense Refill   albuterol (VENTOLIN HFA) 108 (90 Base) MCG/ACT inhaler INHALE 2 PUFFS EVERY SIX HOURS AS NEEDED FOR WHEEZING OR SHORTNESS OF BREATH 3 each 0   Ascorbic Acid (VITAMIN C) 500 MG tablet daily. Take 2 tablet daily     aspirin 81 MG EC tablet Take 1 tablet (81 mg total) by mouth daily with breakfast. 30 tablet 12   Calcium Carbonate-Vitamin D (CALCIUM-VITAMIN D) 500-200 MG-UNIT per tablet Take 1 tablet by mouth 2 (two) times daily with a meal.     cetirizine (ZYRTEC) 10 MG tablet Take 10 mg by mouth daily.     Cranberry 1000 MG CAPS Take by mouth. Take 2 tablets daily     Evolocumab (REPATHA SURECLICK) 591 MG/ML SOAJ Inject 140 mg into the skin every 14 (fourteen) days. 2 mL 11   fluticasone (FLONASE) 50 MCG/ACT nasal spray USE 2 SPRAYS INTO BOTH NOSTRILS DAILY. 48 g 1   Melatonin 5 MG CAPS Take by mouth at bedtime.     metoprolol succinate (TOPROL-XL) 100 MG 24 hr tablet TAKE ONE TABLET IN THE MORNING. TAKE WITH OR IMMEDIATELY FOLLOWING A MEAL. 90 tablet 3   Multiple Vitamin (MULTIVITAMIN) tablet Take 1 tablet by  mouth daily.     Multiple Vitamins-Minerals (VITAMIN D3 COMPLETE PO) Take by mouth.     pantoprazole (PROTONIX) 40 MG tablet TAKE 1 TABLET EVERY MORNING (NEED MD APPOINTMENT, PLEASE CALL  FOR APPOINTMENT) 90 tablet 0   Peppermint Oil 90 MG CPCR Take 2 capsules by mouth 2 (two) times daily.     quinapril (ACCUPRIL) 40 MG tablet TAKE 1 TABLET TWICE DAILY 180 tablet 2   spironolactone (ALDACTONE) 25 MG tablet TAKE 1 TABLET EVERY DAY 90 tablet 3   SYNTHROID 100 MCG tablet TAKE 1 TABLET EVERY MORNING BEFORE BREAKFAST 90 tablet 3   triamterene-hydrochlorothiazide (MAXZIDE-25) 37.5-25 MG tablet TAKE 1/2 TABLET EVERY DAY 45 tablet 3   vitamin B-12 (CYANOCOBALAMIN) 500 MCG tablet Take 500 mcg by mouth daily.     No current facility-administered medications on file prior to visit.    BP (!) 142/80    Pulse 69    Temp 98.3 F (36.8 C) (Oral)    Ht 5\' 2"  (1.575 m)    Wt 176 lb (79.8 kg)    SpO2 96%    BMI 32.19 kg/m       Objective:   Physical Exam Vitals and nursing note reviewed.  Constitutional:      Appearance: Normal appearance.  Cardiovascular:     Rate and Rhythm: Normal rate and regular rhythm.     Pulses: Normal pulses.     Heart sounds: Normal heart sounds.  Pulmonary:     Effort: Pulmonary effort is normal.     Breath sounds: Normal breath sounds.  Abdominal:     General: There is no distension.     Tenderness: There is no abdominal tenderness. There is no right CVA tenderness or left CVA tenderness.  Skin:    General: Skin is warm and dry.     Capillary Refill: Capillary refill takes less than 2 seconds.  Neurological:     General: No focal deficit present.     Mental Status: She is alert and oriented to person, place, and time.  Psychiatric:        Mood and Affect: Mood normal.        Behavior: Behavior normal.        Thought Content: Thought content normal.        Judgment: Judgment normal.      Assessment & Plan:  1. Dysuria  - POC Urinalysis Dipstick + protein,  blood, and leuks.  - Will treat due to symptoms. Follow up once culture has resulted - nitrofurantoin, macrocrystal-monohydrate, (MACROBID) 100 MG capsule; Take 1 capsule (100 mg total) by mouth 2 (two) times daily.  Dispense: 10 capsule; Refill: 0 - Culture, Urine; Future  Dorothyann Peng, NP

## 2021-04-25 NOTE — Telephone Encounter (Signed)
Pt call and stated she want you to give her a call back she think she have a UTI and want to know if she can stop by and give a urine sample .

## 2021-04-26 DIAGNOSIS — R300 Dysuria: Secondary | ICD-10-CM | POA: Diagnosis not present

## 2021-04-28 LAB — URINE CULTURE
MICRO NUMBER:: 12837636
SPECIMEN QUALITY:: ADEQUATE

## 2021-04-29 ENCOUNTER — Telehealth: Payer: Self-pay | Admitting: Adult Health

## 2021-04-29 NOTE — Telephone Encounter (Signed)
Patient called in to ask if she needed to change medications based on test results from last visit on 01/05.  Patient would like to be contacted at [phone].  Please advise.

## 2021-04-30 ENCOUNTER — Encounter: Payer: Self-pay | Admitting: Adult Health

## 2021-04-30 NOTE — Telephone Encounter (Signed)
Spoke to pt and advised of message below. Pt stated that she will still prob. Take the last abx. And will reach out if her sx continue.

## 2021-04-30 NOTE — Telephone Encounter (Signed)
This has been taking care of.

## 2021-04-30 NOTE — Telephone Encounter (Signed)
Spoke to pt and she stated she stopped taking the abx. Pt stated she had diarrhea and nausea " all weekend' .Pt also articulated that her last pill was the night before yesterday. She has 2 pills left but does not know what she needs to do. Pt wants to know if she should continue the pill or toss the rest

## 2021-04-30 NOTE — Telephone Encounter (Signed)
This has been taking care of see phone note.

## 2021-05-01 ENCOUNTER — Encounter: Payer: Self-pay | Admitting: Cardiovascular Disease

## 2021-05-01 ENCOUNTER — Encounter: Payer: Self-pay | Admitting: Adult Health

## 2021-05-01 ENCOUNTER — Other Ambulatory Visit: Payer: Self-pay | Admitting: Adult Health

## 2021-05-01 MED ORDER — AMOXICILLIN-POT CLAVULANATE 875-125 MG PO TABS: 1.0000 | ORAL_TABLET | Freq: Two times a day (BID) | ORAL | 0 refills | Status: AC

## 2021-05-01 NOTE — Telephone Encounter (Signed)
Please advise 

## 2021-05-13 ENCOUNTER — Ambulatory Visit (AMBULATORY_SURGERY_CENTER): Payer: Medicare HMO | Admitting: Internal Medicine

## 2021-05-13 ENCOUNTER — Encounter: Payer: Self-pay | Admitting: Internal Medicine

## 2021-05-13 ENCOUNTER — Encounter: Payer: Self-pay | Admitting: Cardiovascular Disease

## 2021-05-13 VITALS — BP 101/43 | HR 64 | Temp 97.8°F | Resp 15 | Ht 62.0 in | Wt 173.0 lb

## 2021-05-13 DIAGNOSIS — D120 Benign neoplasm of cecum: Secondary | ICD-10-CM | POA: Diagnosis not present

## 2021-05-13 DIAGNOSIS — N183 Chronic kidney disease, stage 3 unspecified: Secondary | ICD-10-CM | POA: Diagnosis not present

## 2021-05-13 DIAGNOSIS — J45909 Unspecified asthma, uncomplicated: Secondary | ICD-10-CM | POA: Diagnosis not present

## 2021-05-13 DIAGNOSIS — Z86010 Personal history of colonic polyps: Secondary | ICD-10-CM

## 2021-05-13 DIAGNOSIS — I129 Hypertensive chronic kidney disease with stage 1 through stage 4 chronic kidney disease, or unspecified chronic kidney disease: Secondary | ICD-10-CM | POA: Diagnosis not present

## 2021-05-13 MED ORDER — SODIUM CHLORIDE 0.9 % IV SOLN: 500.0000 mL | Freq: Once | INTRAVENOUS | Status: DC

## 2021-05-13 MED ORDER — METOPROLOL SUCCINATE ER 100 MG PO TB24: ORAL_TABLET | ORAL | 3 refills | Status: DC

## 2021-05-13 NOTE — Progress Notes (Signed)
Report to PACU, RN, vss, BBS= Clear.  

## 2021-05-13 NOTE — Telephone Encounter (Signed)
Called the patient concerning her medication. She stated that her Metoprolol Succinate was sent in as 100 mg once daily when she has been on 100 mg bid. A new prescription has been refilled for her.

## 2021-05-13 NOTE — Progress Notes (Signed)
[facility] History and Physical   Primary Care Physician:  Dorothyann Peng, NP   Reason for Procedure:   Hx colon polyps  Plan:    colonoscopy     HPI: Jasmine Wheeler is a 76 y.o. female w/ hx colon polps  Tubulovillous adenoma the appendix, surgically resected 2011, also with history in 2008 01/21/2013 8 mm sigmoid polyp "benign non-dysplastic polyp" - 2021 with 18 mm adenoma and fair prep  Past Medical History:  Diagnosis Date   Abnormality of aortic valve    enlarged    Adenomatous colon polyp    Allergy    Arthritis    back    Asthma    Benign gastric polyp - hyperplastic 09/2017   Cancer ([place])    Melanoma left arm    Cataract    forming   Chronic kidney disease    CKD III   Chronic low back pain    Diverticulosis 01/23/2010   left colon   GERD (gastroesophageal reflux disease)    Heart murmur 2016   Hyperlipidemia    controlled with Repatha   Hypertension    Hypothyroidism    IBS (irritable bowel syndrome)    Osteopenia    Spinal stenosis    SVT (supraventricular tachycardia) ([place])     Past Surgical History:  Procedure Laterality Date   ABDOMINAL HYSTERECTOMY     COLON SURGERY  2011   hemicolectomy for a TA polyp   COLONOSCOPY     COLONOSCOPY W/ BIOPSIES  multiple   COSMETIC SURGERY     from   [place]    HEMICOLECTOMY  02/15/2010   right, tubulovillous adenoma appendix, Dr. Georgette Dover   HEMORRHOID BANDING  2014   [place]  2011   x 2   MELANOMA EXCISION Left 2012   left arm, not sure about lymph nodes   POLYPECTOMY     REPEAT CESAREAN SECTION     3 in all, last 1973   SHOULDER SURGERY Bilateral    x2    Prior to Admission medications   Medication Sig Start Date End Date Taking? Authorizing Provider  albuterol (VENTOLIN HFA) 108 (90 Base) MCG/ACT inhaler INHALE 2 PUFFS EVERY SIX HOURS AS NEEDED FOR WHEEZING OR SHORTNESS OF BREATH 03/26/21  Yes Nafziger, Tommi Rumps, NP  Ascorbic Acid (VITAMIN C) 500 MG tablet daily. Take 2  tablet daily   Yes [provider]  aspirin 81 MG EC tablet Take 1 tablet (81 mg total) by mouth daily with breakfast. 03/14/19  Yes Emokpae, Courage, MD  Calcium Carbonate-Vitamin D (CALCIUM-VITAMIN D) 500-200 MG-UNIT per tablet Take 1 tablet by mouth 2 (two) times daily with a meal.   Yes [provider]  cetirizine (ZYRTEC) 10 MG tablet Take 10 mg by mouth daily.   Yes [provider]  Cranberry 1000 MG CAPS Take by mouth. Take 2 tablets daily   Yes [provider]  Evolocumab (REPATHA SURECLICK) 287 MG/ML SOAJ Inject 140 mg into the skin every 14 (fourteen) days. 07/31/20  Yes Croitoru, Mihai, MD  fluticasone (FLONASE) 50 MCG/ACT nasal spray USE 2 SPRAYS INTO BOTH NOSTRILS DAILY. 11/28/20  Yes Nafziger, Tommi Rumps, NP  Melatonin 5 MG CAPS Take by mouth at bedtime.   Yes [provider]  metoprolol succinate (TOPROL-XL) 100 MG 24 hr tablet Take 1 tablet (100 mg total) by mouth in the morning and at bedtime 05/13/21  Yes Croitoru, Mihai, MD  Multiple Vitamin (MULTIVITAMIN) tablet Take 1 tablet by mouth daily.  Yes [provider]  Multiple Vitamins-Minerals (VITAMIN D3 COMPLETE PO) Take by mouth.   Yes [provider]  pantoprazole (PROTONIX) 40 MG tablet TAKE 1 TABLET EVERY MORNING (NEED MD APPOINTMENT, PLEASE CALL FOR APPOINTMENT) 09/25/20  Yes Gatha Mayer, MD  Peppermint Oil 90 MG CPCR Take 2 capsules by mouth 2 (two) times daily.   Yes [provider]  quinapril (ACCUPRIL) 40 MG tablet TAKE 1 TABLET TWICE DAILY 02/27/21  Yes Croitoru, Mihai, MD  spironolactone (ALDACTONE) 25 MG tablet TAKE 1 TABLET EVERY DAY 01/11/21  Yes Croitoru, Mihai, MD  SYNTHROID 100 MCG tablet TAKE 1 TABLET EVERY MORNING BEFORE BREAKFAST 06/07/20  Yes Nafziger, Tommi Rumps, NP  triamterene-hydrochlorothiazide (MAXZIDE-25) 37.5-25 MG tablet TAKE 1/2 TABLET EVERY DAY 01/11/21  Yes Croitoru, Mihai, MD  vitamin B-12 (CYANOCOBALAMIN) 500 MCG tablet Take 500 mcg by mouth  daily.   Yes [provider]    Current Outpatient Medications  Medication Sig Dispense Refill   albuterol (VENTOLIN HFA) 108 (90 Base) MCG/ACT inhaler INHALE 2 PUFFS EVERY SIX HOURS AS NEEDED FOR WHEEZING OR SHORTNESS OF BREATH 3 each 0   Ascorbic Acid (VITAMIN C) 500 MG tablet daily. Take 2 tablet daily     aspirin 81 MG EC tablet Take 1 tablet (81 mg total) by mouth daily with breakfast. 30 tablet 12   Calcium Carbonate-Vitamin D (CALCIUM-VITAMIN D) 500-200 MG-UNIT per tablet Take 1 tablet by mouth 2 (two) times daily with a meal.     cetirizine (ZYRTEC) 10 MG tablet Take 10 mg by mouth daily.     Cranberry 1000 MG CAPS Take by mouth. Take 2 tablets daily     Evolocumab (REPATHA SURECLICK) 026 MG/ML SOAJ Inject 140 mg into the skin every 14 (fourteen) days. 2 mL 11   fluticasone (FLONASE) 50 MCG/ACT nasal spray USE 2 SPRAYS INTO BOTH NOSTRILS DAILY. 48 g 1   Melatonin 5 MG CAPS Take by mouth at bedtime.     metoprolol succinate (TOPROL-XL) 100 MG 24 hr tablet Take 1 tablet (100 mg total) by mouth in the morning and at bedtime 180 tablet 3   Multiple Vitamin (MULTIVITAMIN) tablet Take 1 tablet by mouth daily.     Multiple Vitamins-Minerals (VITAMIN D3 COMPLETE PO) Take by mouth.     pantoprazole (PROTONIX) 40 MG tablet TAKE 1 TABLET EVERY MORNING (NEED MD APPOINTMENT, PLEASE CALL FOR APPOINTMENT) 90 tablet 0   Peppermint Oil 90 MG CPCR Take 2 capsules by mouth 2 (two) times daily.     quinapril (ACCUPRIL) 40 MG tablet TAKE 1 TABLET TWICE DAILY 180 tablet 2   spironolactone (ALDACTONE) 25 MG tablet TAKE 1 TABLET EVERY DAY 90 tablet 3   SYNTHROID 100 MCG tablet TAKE 1 TABLET EVERY MORNING BEFORE BREAKFAST 90 tablet 3   triamterene-hydrochlorothiazide (MAXZIDE-25) 37.5-25 MG tablet TAKE 1/2 TABLET EVERY DAY 45 tablet 3   vitamin B-12 (CYANOCOBALAMIN) 500 MCG tablet Take 500 mcg by mouth daily.     Current Facility-Administered Medications  Medication Dose Route Frequency Provider  Last Rate Last Admin   0.9 %  sodium chloride infusion  500 mL Intravenous Once Gatha Mayer, MD        Allergies as of 05/13/2021 - Review Complete 05/13/2021  Allergen Reaction Noted   Dicyclomine Nausea Only and Other (See Comments) 10/01/2017   Nifedipine Other (See Comments) 06/18/2006   Amlodipine  01/18/2019   Rosuvastatin  07/24/2020   Simvastatin  07/24/2020   Theophylline Other (See Comments) 06/18/2006  Family History  Problem Relation Age of Onset   Colon cancer Mother 105   Kidney cancer Mother    Lung cancer Father    Heart disease Father    Cervical cancer Sister    Colon cancer Maternal Uncle    Colon cancer Maternal Uncle    Colon polyps Neg Hx    Esophageal cancer Neg Hx    Rectal cancer Neg Hx    Stomach cancer Neg Hx     Social History   Socioeconomic History   Marital status: Married    Spouse name: Not on file   Number of children: 3   Years of education: Not on file   Highest education level: Not on file  Occupational History   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    Employer: [employer]  Tobacco Use   Smoking status: Never   Smokeless tobacco: Never  Vaping Use   Vaping Use: Never used  Substance and Sexual Activity   Alcohol use: No   Drug use: No   Sexual activity: Not on file  Other Topics Concern   Not on file  Social History Narrative   Married retired lives in [place]   No alcohol tobacco or drug use   Caffeine is 2 cups of coffee daily   3 children   Elderly mother lives with her and requires some care    Social Determinants of Health   Financial Resource Strain: Low Risk    Difficulty of Paying Living Expenses: Not hard at all  Food Insecurity: No Food Insecurity   Worried About Charity fundraiser in the Last Year: Never true   Arboriculturist in the Last Year: Never true  Transportation Needs: No Transportation Needs   Lack of Transportation (Medical): No   Lack of Transportation (Non-Medical): No  Physical Activity:  Insufficiently Active   Days of Exercise per Week: 3 days   Minutes of Exercise per Session: 30 min  Stress: No Stress Concern Present   Feeling of Stress : Not at all  Social Connections: Moderately Integrated   Frequency of Communication with Friends and Family: Three times a week   Frequency of Social Gatherings with Friends and Family: Three times a week   Attends Religious Services: More than 4 times per year   Active Member of Clubs or Organizations: No   Attends Archivist Meetings: Never   Marital Status: Married  Human resources officer Violence: Not At Risk   Fear of Current or Ex-Partner: No   Emotionally Abused: No   Physically Abused: No   Sexually Abused: No    Review of Systems:  All other review of systems negative except as mentioned in the HPI.  Physical Exam: Vital signs BP 140/78    Pulse 70    Temp 97.8 F (36.6 C) (Skin)    Ht 5\' 2"  (1.575 m)    Wt 173 lb (78.5 kg)    SpO2 99%    BMI 31.64 kg/m   General:   Alert,  Well-developed, well-nourished, pleasant and cooperative in NAD Lungs:  Clear throughout to auscultation.   Heart:  Regular rate and rhythm; no murmurs, clicks, rubs,  or gallops. Abdomen:  Soft, nontender and nondistended. Normal bowel sounds.   Neuro/Psych:  Alert and cooperative. Normal mood and affect. A and O x 3   @Raffi Milstein  Simonne Maffucci, MD, [facility] [phone] (pager) 05/13/2021 3:56 PM@

## 2021-05-13 NOTE — Progress Notes (Signed)
VS by DT  Pt's states no medical or surgical changes since previsit or office visit.  

## 2021-05-13 NOTE — Patient Instructions (Addendum)
I found and removed 2 small polyps.  I will let you know pathology results by mail and/or My Chart.  I am thinking you will not need another routine colonoscopy but will let you know.  I appreciate the opportunity to care for you. Gatha Mayer, MD, FACG  YOU HAD AN ENDOSCOPIC PROCEDURE TODAY AT [facility]:   Refer to the procedure report that was given to you for any specific questions about what was found during the examination.  If the procedure report does not answer your questions, please call your gastroenterologist to clarify.  If you requested that your care partner not be given the details of your procedure findings, then the procedure report has been included in a sealed envelope for you to review at your convenience later.  YOU SHOULD EXPECT: Some feelings of bloating in the abdomen. Passage of more gas than usual.  Walking can help get rid of the air that was put into your GI tract during the procedure and reduce the bloating. If you had a lower endoscopy (such as a colonoscopy or flexible sigmoidoscopy) you may notice spotting of blood in your stool or on the toilet paper. If you underwent a bowel prep for your procedure, you may not have a normal bowel movement for a few days.  Please Note:  You might notice some irritation and congestion in your nose or some drainage.  This is from the oxygen used during your procedure.  There is no need for concern and it should clear up in a day or so.  SYMPTOMS TO REPORT IMMEDIATELY:  Following lower endoscopy (colonoscopy or flexible sigmoidoscopy):  Excessive amounts of blood in the stool  Significant tenderness or worsening of abdominal pains  Swelling of the abdomen that is new, acute  Fever of 100F or higher  For urgent or emergent issues, a gastroenterologist can be reached at any hour by calling [phone]. Do not use MyChart messaging for urgent concerns.    DIET:  We do recommend a small meal at first,  but then you may proceed to your regular diet.  Drink plenty of fluids but you should avoid alcoholic beverages for 24 hours.  ACTIVITY:  You should plan to take it easy for the rest of today and you should NOT DRIVE or use heavy machinery until tomorrow (because of the sedation medicines used during the test).    FOLLOW UP: Our staff will call the number listed on your records 48-72 hours following your procedure to check on you and address any questions or concerns that you may have regarding the information given to you following your procedure. If we do not reach you, we will leave a message.  We will attempt to reach you two times.  During this call, we will ask if you have developed any symptoms of COVID 19. If you develop any symptoms (ie: fever, flu-like symptoms, shortness of breath, cough etc.) before then, please call [phone].  If you test positive for Covid 19 in the 2 weeks post procedure, please call and report this information to [place].    If any biopsies were taken you will be contacted by phone or by letter within the next 1-3 weeks.  Please call us at [phone] if you have not heard about the biopsies in 3 weeks.    SIGNATURES/CONFIDENTIALITY: You and/or your care partner have signed paperwork which will be entered into your electronic medical record.  These signatures attest to the fact that  that the information above on your After Visit Summary has been reviewed and is understood.  Full responsibility of the confidentiality of this discharge information lies with you and/or your care-partner.

## 2021-05-13 NOTE — Progress Notes (Signed)
Called to room to assist during endoscopic procedure.  Patient ID and intended procedure confirmed with present staff. Received instructions for my participation in the procedure from the performing physician.  

## 2021-05-13 NOTE — Op Note (Signed)
[facility] Patient Name: Jasmine Wheeler Procedure Date: 05/13/2021 3:55 PM MRN: [id number] Endoscopist: Gatha Mayer , MD Age: 76 Referring MD:  Date of Birth: 08-Dec-1945 Gender: Female Account #: [id number] Procedure:                Colonoscopy Indications:              Surveillance: Piecemeal removal of large sessile                            adenoma last colonoscopy (< 3 yrs) Medicines:                Propofol per Anesthesia, Monitored Anesthesia Care Procedure:                Pre-Anesthesia Assessment:                           - Prior to the procedure, a History and Physical                            was performed, and patient medications and                            allergies were reviewed. The patient's tolerance of                            previous anesthesia was also reviewed. The risks                            and benefits of the procedure and the sedation                            options and risks were discussed with the patient.                            All questions were answered, and informed consent                            was obtained. Prior Anticoagulants: The patient has                            taken no previous anticoagulant or antiplatelet                            agents. ASA Grade Assessment: III - A patient with                            severe systemic disease. After reviewing the risks                            and benefits, the patient was deemed in                            satisfactory condition to undergo the procedure.  After obtaining informed consent, the colonoscope                            was passed under direct vision. Throughout the                            procedure, the patient's blood pressure, pulse, and                            oxygen saturations were monitored continuously. The                            Olympus Colonoscope #[id number] was introduced through                             the anus and advanced to the the ileocolonic                            anastomosis. The colonoscopy was performed without                            difficulty. The patient tolerated the procedure                            well. The quality of the bowel preparation was                            good. The rectum and Ileocolonic anastomsis areas                            were photographed. Scope In: 4:03:56 PM Scope Out: 4:19:30 PM Scope Withdrawal Time: 0 hours 10 minutes 34 seconds  Total Procedure Duration: 0 hours 15 minutes 34 seconds  Findings:                 The perianal and digital rectal examinations were                            normal.                           A 10 mm polyp was found in the anastomosis. The                            polyp was sessile. The polyp was removed with a                            piecemeal technique using a cold snare. Resection                            and retrieval were complete. Verification of                            patient identification for the specimen was done.  Estimated blood loss was minimal.                           A 3 mm polyp was found in the descending colon. The                            polyp was sessile. The polyp was removed with a                            cold snare. Resection was complete, but the polyp                            tissue was not retrieved. Verification of patient                            identification for the specimen was done. Estimated                            blood loss was minimal.                           Multiple diverticula were found in the sigmoid                            colon and descending colon.                           There was evidence of a prior end-to-side                            ileo-colonic anastomosis in the ascending colon.                            This was patent and was characterized by healthy                             appearing mucosa.                           The exam was otherwise without abnormality on                            direct and retroflexion views. Complications:            No immediate complications. Estimated Blood Loss:     Estimated blood loss was minimal. Impression:               - One 10 mm polyp at the anastomosis, removed                            piecemeal using a cold snare. Resected and                            retrieved.                           -  One 3 mm polyp in the descending colon, removed                            with a cold snare. Complete resection. Polyp tissue                            not retrieved.                           - Diverticulosis in the sigmoid colon and in the                            descending colon.                           - Patent end-to-side ileo-colonic anastomosis,                            characterized by healthy appearing mucosa.                           - The examination was otherwise normal on direct                            and retroflexion views.                           - Personal history of colonic polyps. Recommendation:           - Patient has a contact number available for                            emergencies. The signs and symptoms of potential                            delayed complications were discussed with the                            patient. Return to normal activities tomorrow.                            Written discharge instructions were provided to the                            patient.                           - Resume previous diet.                           - Continue present medications.                           - No recommendation at this time regarding repeat                            colonoscopy. Probably  no routine repeaqt - await                            pathology review Gatha Mayer, MD 05/13/2021 4:35:52 PM This report has been signed electronically.

## 2021-05-15 ENCOUNTER — Telehealth: Payer: Self-pay

## 2021-05-15 NOTE — Telephone Encounter (Signed)
°  Follow up Call-  Call back number 05/13/2021 02/03/2020  Post procedure Call Back phone  # [phone]  Permission to leave phone message Yes Yes  Some recent data might be hidden     Patient questions:  Do you have a fever, pain , or abdominal swelling? No. Pain Score  0 *  Have you tolerated food without any problems? Yes.    Have you been able to return to your normal activities? Yes.    Do you have any questions about your discharge instructions: Diet   No. Medications  No. Follow up visit  No.  Do you have questions or concerns about your Care? No.  Actions: * If pain score is 4 or above: No action needed, pain <4.

## 2021-05-20 ENCOUNTER — Encounter: Payer: Self-pay | Admitting: Internal Medicine

## 2021-05-27 ENCOUNTER — Other Ambulatory Visit: Payer: Self-pay | Admitting: Internal Medicine

## 2021-05-31 ENCOUNTER — Encounter: Payer: Self-pay | Admitting: Cardiovascular Disease

## 2021-05-31 NOTE — Telephone Encounter (Signed)
Spoke to the patient. She stated that her pharmacy stated that the Quinapril is on back order and they do not have a release date for it. She was advised to call her cardiologist for an replacement.  The patient does have a 3 month supply on hand.

## 2021-06-07 ENCOUNTER — Encounter: Payer: Self-pay | Admitting: Adult Health

## 2021-06-07 ENCOUNTER — Ambulatory Visit (INDEPENDENT_AMBULATORY_CARE_PROVIDER_SITE_OTHER): Payer: Medicare HMO | Admitting: Adult Health

## 2021-06-07 VITALS — BP 120/70 | HR 64 | Temp 98.3°F | Ht 62.0 in | Wt 172.0 lb

## 2021-06-07 DIAGNOSIS — E039 Hypothyroidism, unspecified: Secondary | ICD-10-CM

## 2021-06-07 DIAGNOSIS — I10 Essential (primary) hypertension: Secondary | ICD-10-CM | POA: Diagnosis not present

## 2021-06-07 DIAGNOSIS — E782 Mixed hyperlipidemia: Secondary | ICD-10-CM

## 2021-06-07 DIAGNOSIS — Z0000 Encounter for general adult medical examination without abnormal findings: Secondary | ICD-10-CM

## 2021-06-07 DIAGNOSIS — R7302 Impaired glucose tolerance (oral): Secondary | ICD-10-CM | POA: Diagnosis not present

## 2021-06-07 DIAGNOSIS — N183 Chronic kidney disease, stage 3 unspecified: Secondary | ICD-10-CM | POA: Diagnosis not present

## 2021-06-07 LAB — HEMOGLOBIN A1C: Hgb A1c MFr Bld: 5.7 % (ref 4.6–6.5)

## 2021-06-07 LAB — LIPID PANEL
Cholesterol: 130 mg/dL (ref 0–200)
HDL: 45.1 mg/dL (ref >39.00)
LDL Cholesterol: 58 mg/dL (ref 0–99)
NonHDL: 85.08
Total CHOL/HDL Ratio: 3
Triglycerides: 135 mg/dL (ref 0.0–149.0)
VLDL: 27 mg/dL (ref 0.0–40.0)

## 2021-06-07 LAB — CBC WITH DIFFERENTIAL/PLATELET
Basophils Absolute: 0 10*3/uL (ref 0.0–0.1)
Basophils Relative: 0.7 % (ref 0.0–3.0)
Eosinophils Absolute: 0.2 10*3/uL (ref 0.0–0.7)
Eosinophils Relative: 4.5 % (ref 0.0–5.0)
HCT: 28.6 % — ABNORMAL LOW (ref 36.0–46.0)
Hemoglobin: 9.6 g/dL — ABNORMAL LOW (ref 12.0–15.0)
Lymphocytes Relative: 23.4 % (ref 12.0–46.0)
Lymphs Abs: 0.9 10*3/uL (ref 0.7–4.0)
MCHC: 33.6 g/dL (ref 30.0–36.0)
MCV: 89.1 fl (ref 78.0–100.0)
Monocytes Absolute: 0.3 10*3/uL (ref 0.1–1.0)
Monocytes Relative: 8.1 % (ref 3.0–12.0)
Neutro Abs: 2.5 10*3/uL (ref 1.4–7.7)
Neutrophils Relative %: 63.3 % (ref 43.0–77.0)
Platelets: 209 10*3/uL (ref 150.0–400.0)
RBC: 3.21 Mil/uL — ABNORMAL LOW (ref 3.87–5.11)
RDW: 13.9 % (ref 11.5–15.5)
WBC: 3.9 10*3/uL — ABNORMAL LOW (ref 4.0–10.5)

## 2021-06-07 LAB — COMPREHENSIVE METABOLIC PANEL
ALT: 8 U/L (ref 0–35)
AST: 12 U/L (ref 0–37)
Albumin: 4.2 g/dL (ref 3.5–5.2)
Alkaline Phosphatase: 55 U/L (ref 39–117)
BUN: 35 mg/dL — ABNORMAL HIGH (ref 6–23)
CO2: 31 mEq/L (ref 19–32)
Calcium: 9 mg/dL (ref 8.4–10.5)
Chloride: 102 mEq/L (ref 96–112)
Creatinine, Ser: 1.72 mg/dL — ABNORMAL HIGH (ref 0.40–1.20)
GFR: 28.24 mL/min — ABNORMAL LOW (ref >60.00)
Glucose, Bld: 98 mg/dL (ref 70–99)
Potassium: 4.2 mEq/L (ref 3.5–5.1)
Sodium: 138 mEq/L (ref 135–145)
Total Bilirubin: 0.5 mg/dL (ref 0.2–1.2)
Total Protein: 6.3 g/dL (ref 6.0–8.3)

## 2021-06-07 LAB — TSH: TSH: 1.58 u[IU]/mL (ref 0.35–5.50)

## 2021-06-07 NOTE — Patient Instructions (Signed)
It was great seeing you today   We will follow up with you regarding your lab work   Please let me know if you need anything   

## 2021-06-07 NOTE — Progress Notes (Signed)
Subjective:    Patient ID: Jasmine Wheeler, female    DOB: [birth year], 76 y.o.   MRN: [id number]  HPI Patient presents for yearly preventative medicine examination. She is a pleasant 76 year old female who  has a past medical history of Abnormality of aortic valve, Adenomatous colon polyp, Allergy, Arthritis, Asthma, Benign gastric polyp - hyperplastic (09/2017), Cancer ([place]), Cataract, Chronic kidney disease, Chronic low back pain, Diverticulosis (01/23/2010), GERD (gastroesophageal reflux disease), Heart murmur (2016), Hyperlipidemia, Hypertension, Hypothyroidism, IBS (irritable bowel syndrome), Osteopenia, Spinal stenosis, and SVT (supraventricular tachycardia) ([place]).  She and her husband are celebrating their 71 wedding anniversary this month.   Hypertension/SVT-is managed by cardiology.  Currently prescribed quinapril 40 mg,  spironolactone 25 mg, Maxide 37.5-25mg   and metoprolol 100 mg XR.  She does monitor her blood pressures at home with readings between 110 and 130 over 70s to 80s.  She denies dizziness, lightheadedness, chest pain, shortness of breath. She will have feeling of tachycardia from time time. Notices this when she is being too active  BP Readings from Last 3 Encounters:  06/07/21 120/70  05/13/21 (!) 101/43  04/25/21 (!) 142/80   Hypothyroidism-takes synthroid 100 mcg daily.  Feels well controlled  GERD-is controlled with Protonix 40 mg daily  Hyperlipidemia-currently prescribed Repatha 140 mg every two weeks.   This is managed by cardiology.  In the past she has been on statin due to myalgia. Lab Results  Component Value Date   CHOL 111 09/13/2020   HDL 52 09/13/2020   LDLCALC 41 09/13/2020   TRIG 96 09/13/2020   CHOLHDL 2.1 09/13/2020   Chronic Kidney disease  Stage 3 B- is seen by Nephrology on a routine basis. Has no complaints.   Asthma - well controlled. Will use Albuterol inhaler PRN.   All immunizations and health maintenance protocols were reviewed  with the patient and needed orders were placed.  Appropriate screening laboratory values were ordered for the patient including screening of hyperlipidemia, renal function and hepatic function.  Medication reconciliation,  past medical history, social history, problem list and allergies were reviewed in detail with the patient  Goals were established with regard to weight loss, exercise, and  diet in compliance with medications Wt Readings from Last 3 Encounters:  06/07/21 172 lb (78 kg)  05/13/21 173 lb (78.5 kg)  04/25/21 176 lb (79.8 kg)   She has no acute complaints.   Review of Systems  Constitutional: Negative.   HENT: Negative.    Eyes: Negative.   Respiratory: Negative.    Cardiovascular: Negative.   Gastrointestinal: Negative.   Endocrine: Negative.   Genitourinary: Negative.   Musculoskeletal:  Positive for arthralgias.  Skin: Negative.   Allergic/Immunologic: Negative.   Neurological: Negative.   Hematological: Negative.   Psychiatric/Behavioral: Negative.     Past Medical History:  Diagnosis Date   Abnormality of aortic valve    enlarged    Adenomatous colon polyp    Allergy    Arthritis    back    Asthma    Benign gastric polyp - hyperplastic 09/2017   Cancer ([place])    Melanoma left arm    Cataract    forming   Chronic kidney disease    CKD III   Chronic low back pain    Diverticulosis 01/23/2010   left colon   GERD (gastroesophageal reflux disease)    Heart murmur 2016   Hyperlipidemia    controlled with Repatha   Hypertension    Hypothyroidism  IBS (irritable bowel syndrome)    Osteopenia    Spinal stenosis    SVT (supraventricular tachycardia) ([facility])     Social History   Socioeconomic History   Marital status: Married    Spouse name: Not on file   Number of children: 3   Years of education: Not on file   Highest education level: Not on file  Occupational History   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MGR    Employer: [employer]  Tobacco Use    Smoking status: Never   Smokeless tobacco: Never  Vaping Use   Vaping Use: Never used  Substance and Sexual Activity   Alcohol use: No   Drug use: No   Sexual activity: Not on file  Other Topics Concern   Not on file  Social History Narrative   Married retired lives in [place]   No alcohol tobacco or drug use   Caffeine is 2 cups of coffee daily   3 children   Elderly mother lives with her and requires some care    Social Determinants of Health   Financial Resource Strain: Low Risk    Difficulty of Paying Living Expenses: Not hard at all  Food Insecurity: No Food Insecurity   Worried About Charity fundraiser in the Last Year: Never true   Arboriculturist in the Last Year: Never true  Transportation Needs: No Transportation Needs   Lack of Transportation (Medical): No   Lack of Transportation (Non-Medical): No  Physical Activity: Insufficiently Active   Days of Exercise per Week: 3 days   Minutes of Exercise per Session: 30 min  Stress: No Stress Concern Present   Feeling of Stress : Not at all  Social Connections: Moderately Integrated   Frequency of Communication with Friends and Family: Three times a week   Frequency of Social Gatherings with Friends and Family: Three times a week   Attends Religious Services: More than 4 times per year   Active Member of Clubs or Organizations: No   Attends Archivist Meetings: Never   Marital Status: Married  Human resources officer Violence: Not At Risk   Fear of Current or Ex-Partner: No   Emotionally Abused: No   Physically Abused: No   Sexually Abused: No    Past Surgical History:  Procedure Laterality Date   ABDOMINAL HYSTERECTOMY     COLON SURGERY  2011   hemicolectomy for a TA polyp   COLONOSCOPY     COLONOSCOPY W/ BIOPSIES  multiple   COSMETIC SURGERY     from   [place]  02/15/2010   right, tubulovillous adenoma appendix, Dr. Georgette Dover   HEMORRHOID BANDING  2014   [place]  2011    x 2   MELANOMA EXCISION Left 2012   left arm, not sure about lymph nodes   POLYPECTOMY     REPEAT CESAREAN SECTION     3 in all, last 1973   SHOULDER SURGERY Bilateral    x2    Family History  Problem Relation Age of Onset   Colon cancer Mother 84   Kidney cancer Mother    Lung cancer Father    Heart disease Father    Cervical cancer Sister    Colon cancer Maternal Uncle    Colon cancer Maternal Uncle    Colon polyps Neg Hx    Esophageal cancer Neg Hx    Rectal cancer Neg Hx    Stomach cancer Neg Hx  Allergies  Allergen Reactions   Dicyclomine Nausea Only and Other (See Comments)    Nausea, tingling in her whole body, couldn't sleep, made lights bright   Nifedipine Other (See Comments)    Dropped blood pressure and heart rate too much   Amlodipine     Dizzy and feel like almost passing out   Rosuvastatin     MYALGIA   Simvastatin     MYALGIA   Theophylline Other (See Comments)    Patient cannot recall reaction    Current Outpatient Medications on File Prior to Visit  Medication Sig Dispense Refill   albuterol (VENTOLIN HFA) 108 (90 Base) MCG/ACT inhaler INHALE 2 PUFFS EVERY SIX HOURS AS NEEDED FOR WHEEZING OR SHORTNESS OF BREATH 3 each 0   Ascorbic Acid (VITAMIN C) 500 MG tablet daily. Take 2 tablet daily     aspirin 81 MG EC tablet Take 1 tablet (81 mg total) by mouth daily with breakfast. 30 tablet 12   Calcium Carbonate-Vitamin D (CALCIUM-VITAMIN D) 500-200 MG-UNIT per tablet Take 1 tablet by mouth 2 (two) times daily with a meal.     cetirizine (ZYRTEC) 10 MG tablet Take 10 mg by mouth daily.     Cranberry 1000 MG CAPS Take by mouth. Take 2 tablets daily     Evolocumab (REPATHA SURECLICK) 381 MG/ML SOAJ Inject 140 mg into the skin every 14 (fourteen) days. 2 mL 11   fluticasone (FLONASE) 50 MCG/ACT nasal spray USE 2 SPRAYS INTO BOTH NOSTRILS DAILY. 48 g 1   Melatonin 5 MG CAPS Take by mouth at bedtime.     metoprolol succinate (TOPROL-XL) 100 MG 24 hr tablet  Take 1 tablet (100 mg total) by mouth in the morning and at bedtime 180 tablet 3   Multiple Vitamin (MULTIVITAMIN) tablet Take 1 tablet by mouth daily.     Multiple Vitamins-Minerals (VITAMIN D3 COMPLETE PO) Take by mouth.     pantoprazole (PROTONIX) 40 MG tablet Take 1 tablet (40 mg total) by mouth daily before breakfast. 90 tablet 3   Peppermint Oil 90 MG CPCR Take 2 capsules by mouth 2 (two) times daily.     quinapril (ACCUPRIL) 40 MG tablet TAKE 1 TABLET TWICE DAILY 180 tablet 2   spironolactone (ALDACTONE) 25 MG tablet TAKE 1 TABLET EVERY DAY 90 tablet 3   SYNTHROID 100 MCG tablet TAKE 1 TABLET EVERY MORNING BEFORE BREAKFAST 90 tablet 3   triamterene-hydrochlorothiazide (MAXZIDE-25) 37.5-25 MG tablet TAKE 1/2 TABLET EVERY DAY 45 tablet 3   vitamin B-12 (CYANOCOBALAMIN) 500 MCG tablet Take 500 mcg by mouth daily.     No current facility-administered medications on file prior to visit.    BP 120/70    Pulse 64    Temp 98.3 F (36.8 C) (Oral)    Ht 5\' 2"  (1.575 m)    Wt 172 lb (78 kg)    SpO2 98%    BMI 31.46 kg/m       Objective:   Physical Exam Vitals and nursing note reviewed.  Constitutional:      General: She is not in acute distress.    Appearance: Normal appearance. She is well-developed. She is not ill-appearing.  HENT:     Head: Normocephalic and atraumatic.     Right Ear: Tympanic membrane, ear canal and external ear normal. There is no impacted cerumen.     Left Ear: Tympanic membrane, ear canal and external ear normal. There is no impacted cerumen.     Nose: Nose normal.  No congestion or rhinorrhea.     Mouth/Throat:     Mouth: Mucous membranes are moist.     Pharynx: Oropharynx is clear. No oropharyngeal exudate or posterior oropharyngeal erythema.  Eyes:     General:        Right eye: No discharge.        Left eye: No discharge.     Extraocular Movements: Extraocular movements intact.     Conjunctiva/sclera: Conjunctivae normal.     Pupils: Pupils are equal,  round, and reactive to light.  Neck:     Thyroid: No thyromegaly.     Vascular: No carotid bruit.     Trachea: No tracheal deviation.  Cardiovascular:     Rate and Rhythm: Normal rate and regular rhythm.     Pulses: Normal pulses.     Heart sounds: Normal heart sounds. No murmur heard.   No friction rub. No gallop.  Pulmonary:     Effort: Pulmonary effort is normal. No respiratory distress.     Breath sounds: Normal breath sounds. No stridor. No wheezing, rhonchi or rales.  Chest:     Chest wall: No tenderness.  Abdominal:     General: Abdomen is flat. Bowel sounds are normal. There is no distension.     Palpations: Abdomen is soft. There is no mass.     Tenderness: There is no abdominal tenderness. There is no right CVA tenderness, left CVA tenderness, guarding or rebound.     Hernia: No hernia is present.  Musculoskeletal:        General: No swelling, tenderness, deformity or signs of injury. Normal range of motion.     Cervical back: Normal range of motion and neck supple.     Right lower leg: No edema.     Left lower leg: No edema.  Lymphadenopathy:     Cervical: No cervical adenopathy.  Skin:    General: Skin is warm and dry.     Coloration: Skin is not jaundiced or pale.     Findings: No bruising, erythema, lesion or rash.  Neurological:     General: No focal deficit present.     Mental Status: She is alert and oriented to person, place, and time.     Cranial Nerves: No cranial nerve deficit.     Sensory: No sensory deficit.     Motor: No weakness.     Coordination: Coordination normal.     Gait: Gait normal.     Deep Tendon Reflexes: Reflexes normal.  Psychiatric:        Mood and Affect: Mood normal.        Behavior: Behavior normal.        Thought Content: Thought content normal.        Judgment: Judgment normal.      Assessment & Plan:  1. Routine general medical examination at a health care facility - Follow up in one year or sooner if needed - Continue to  eat healthy and exercise - CBC with Differential/Platelet; Future - Comprehensive metabolic panel; Future - Hemoglobin A1c; Future - Lipid panel; Future - TSH; Future - Hemoglobin A1c; Future  2. Acquired hypothyroidism - Consider increase in synthroid  - CBC with Differential/Platelet; Future - Comprehensive metabolic panel; Future - Hemoglobin A1c; Future - Lipid panel; Future - TSH; Future - Hemoglobin A1c; Future  3. Essential hypertension - Well controlled.  - CBC with Differential/Platelet; Future - Comprehensive metabolic panel; Future - Hemoglobin A1c; Future - Lipid panel; Future - TSH;  Future - Hemoglobin A1c; Future  4. Mixed hyperlipidemia - Continue Repatha  - CBC with Differential/Platelet; Future - Comprehensive metabolic panel; Future - Hemoglobin A1c; Future - Lipid panel; Future - TSH; Future - Hemoglobin A1c; Future  5. Stage 3 chronic kidney disease, unspecified whether stage 3a or 3b CKD ([place]) - Follow up with Nephrology as directed - CBC with Differential/Platelet; Future - Comprehensive metabolic panel; Future - Hemoglobin A1c; Future - Lipid panel; Future - TSH; Future - Hemoglobin A1c; Future  6. Impaired glucose tolerance  - CBC with Differential/Platelet; Future - Comprehensive metabolic panel; Future - Hemoglobin A1c; Future - Lipid panel; Future - TSH; Future - Hemoglobin A1c; Future  Dorothyann Peng, NP

## 2021-06-13 ENCOUNTER — Other Ambulatory Visit: Payer: Self-pay

## 2021-06-13 ENCOUNTER — Encounter: Payer: Self-pay | Admitting: Cardiovascular Disease

## 2021-06-13 MED ORDER — REPATHA SURECLICK 140 MG/ML ~~LOC~~ SOAJ: 140.0000 mg | SUBCUTANEOUS | 3 refills | Status: DC

## 2021-07-01 ENCOUNTER — Other Ambulatory Visit: Payer: Self-pay

## 2021-07-01 ENCOUNTER — Emergency Department (HOSPITAL_COMMUNITY): Payer: Medicare HMO

## 2021-07-01 ENCOUNTER — Encounter (HOSPITAL_COMMUNITY): Payer: Self-pay | Admitting: Emergency Medicine

## 2021-07-01 ENCOUNTER — Emergency Department (HOSPITAL_COMMUNITY)
Admission: EM | Admit: 2021-07-01 | Discharge: 2021-07-02 | Disposition: A | Discharge status: Home / Self Care | Payer: Medicare HMO | Attending: Emergency Medicine | Admitting: Emergency Medicine

## 2021-07-01 DIAGNOSIS — I10 Essential (primary) hypertension: Secondary | ICD-10-CM | POA: Diagnosis not present

## 2021-07-01 DIAGNOSIS — Z79899 Other long term (current) drug therapy: Secondary | ICD-10-CM | POA: Diagnosis not present

## 2021-07-01 DIAGNOSIS — R300 Dysuria: Secondary | ICD-10-CM | POA: Diagnosis not present

## 2021-07-01 DIAGNOSIS — N281 Cyst of kidney, acquired: Secondary | ICD-10-CM | POA: Diagnosis not present

## 2021-07-01 DIAGNOSIS — R1031 Right lower quadrant pain: Secondary | ICD-10-CM | POA: Insufficient documentation

## 2021-07-01 DIAGNOSIS — Z87442 Personal history of urinary calculi: Secondary | ICD-10-CM | POA: Diagnosis not present

## 2021-07-01 DIAGNOSIS — Z7982 Long term (current) use of aspirin: Secondary | ICD-10-CM | POA: Insufficient documentation

## 2021-07-01 DIAGNOSIS — D7389 Other diseases of spleen: Secondary | ICD-10-CM | POA: Diagnosis not present

## 2021-07-01 DIAGNOSIS — K5730 Diverticulosis of large intestine without perforation or abscess without bleeding: Secondary | ICD-10-CM | POA: Diagnosis not present

## 2021-07-01 DIAGNOSIS — N12 Tubulo-interstitial nephritis, not specified as acute or chronic: Secondary | ICD-10-CM | POA: Diagnosis not present

## 2021-07-01 DIAGNOSIS — N10 Acute tubulo-interstitial nephritis: Secondary | ICD-10-CM

## 2021-07-01 LAB — CBC WITH DIFFERENTIAL/PLATELET
Abs Immature Granulocytes: 0.03 10*3/uL (ref 0.00–0.07)
Basophils Absolute: 0 10*3/uL (ref 0.0–0.1)
Basophils Relative: 0 %
Eosinophils Absolute: 0.1 10*3/uL (ref 0.0–0.5)
Eosinophils Relative: 2 %
HCT: 30.2 % — ABNORMAL LOW (ref 36.0–46.0)
Hemoglobin: 9.9 g/dL — ABNORMAL LOW (ref 12.0–15.0)
Immature Granulocytes: 0 %
Lymphocytes Relative: 10 %
Lymphs Abs: 0.8 10*3/uL (ref 0.7–4.0)
MCH: 30.5 pg (ref 26.0–34.0)
MCHC: 32.8 g/dL (ref 30.0–36.0)
MCV: 92.9 fL (ref 80.0–100.0)
Monocytes Absolute: 0.9 10*3/uL (ref 0.1–1.0)
Monocytes Relative: 12 %
Neutro Abs: 5.8 10*3/uL (ref 1.7–7.7)
Neutrophils Relative %: 76 %
Platelets: 260 10*3/uL (ref 150–400)
RBC: 3.25 MIL/uL — ABNORMAL LOW (ref 3.87–5.11)
RDW: 12.7 % (ref 11.5–15.5)
WBC: 7.7 10*3/uL (ref 4.0–10.5)
nRBC: 0 % (ref 0.0–0.2)

## 2021-07-01 LAB — COMPREHENSIVE METABOLIC PANEL
ALT: 15 U/L (ref 0–44)
AST: 16 U/L (ref 15–41)
Albumin: 3.4 g/dL — ABNORMAL LOW (ref 3.5–5.0)
Alkaline Phosphatase: 86 U/L (ref 38–126)
Anion gap: 11 (ref 5–15)
BUN: 30 mg/dL — ABNORMAL HIGH (ref 8–23)
CO2: 23 mmol/L (ref 22–32)
Calcium: 9.2 mg/dL (ref 8.9–10.3)
Chloride: 101 mmol/L (ref 98–111)
Creatinine, Ser: 1.77 mg/dL — ABNORMAL HIGH (ref 0.44–1.00)
GFR, Estimated: 29 mL/min — ABNORMAL LOW (ref >60)
Glucose, Bld: 122 mg/dL — ABNORMAL HIGH (ref 70–99)
Potassium: 4.8 mmol/L (ref 3.5–5.1)
Sodium: 135 mmol/L (ref 135–145)
Total Bilirubin: 0.4 mg/dL (ref 0.3–1.2)
Total Protein: 6.5 g/dL (ref 6.5–8.1)

## 2021-07-01 LAB — URINALYSIS, ROUTINE W REFLEX MICROSCOPIC
Bilirubin Urine: NEGATIVE
Glucose, UA: NEGATIVE mg/dL
Ketones, ur: NEGATIVE mg/dL
Nitrite: NEGATIVE
Protein, ur: 100 mg/dL — AB
Specific Gravity, Urine: 1.01 (ref 1.005–1.030)
WBC, UA: >50 WBC/hpf — ABNORMAL HIGH (ref 0–5)
pH: 5 (ref 5.0–8.0)

## 2021-07-01 LAB — LIPASE, BLOOD: Lipase: 52 U/L — ABNORMAL HIGH (ref 11–51)

## 2021-07-01 NOTE — ED Triage Notes (Signed)
Patient reports right lateral abdominal pain with fatigue today , no emesis or diarrhea , denies fever or chills , she adds urinary frequency with dysuria onset last week .  ?

## 2021-07-01 NOTE — ED Provider Triage Note (Signed)
Emergency Medicine Provider Triage Evaluation Note ? ?Jasmine Wheeler , a 76 y.o. female  was evaluated in triage.  Pt complains of dysuria, frequency for 1 week. Now having flank pain and fatigue. ? ?Review of Systems  ?Positive: Flank pain, dysuria, frequency, fatigue ?Negative: vomiting ? ?Physical Exam  ?BP (!) 157/78 (BP Location: Right Arm)   Pulse 75   Temp 98.3 ?F (36.8 ?C) (Oral)   Resp 15   Ht '5\' 2"'$  (1.575 m)   Wt 85 kg   SpO2 100%   BMI 34.27 kg/m?  ?Gen:   Awake, no distress   ?Resp:  Normal effort  ?MSK:   Moves extremities without difficulty  ?Other:  RUQ and right cva ttp, abd soft ? ?Medical Decision Making  ?Medically screening exam initiated at 9:04 PM.  Appropriate orders placed.  Jasmine Wheeler was informed that the remainder of the evaluation will be completed by another provider, this initial triage assessment does not replace that evaluation, and the importance of remaining in the ED until their evaluation is complete. ? ? ?  ?Rodney Booze, PA-C ?07/01/21 2105 ? ?

## 2021-07-02 MED ORDER — CEFTRIAXONE SODIUM 1 G IJ SOLR
1.0000 g | Freq: Once | INTRAMUSCULAR | Status: AC
Start: 2021-07-02 — End: 2021-07-02
  Administered 2021-07-02: 1 g via INTRAMUSCULAR
  Filled 2021-07-02: qty 10

## 2021-07-02 MED ORDER — STERILE WATER FOR INJECTION IJ SOLN
INTRAMUSCULAR | Status: AC
  Filled 2021-07-02: qty 10

## 2021-07-02 MED ORDER — CEPHALEXIN 500 MG PO CAPS: 500.0000 mg | ORAL_CAPSULE | Freq: Three times a day (TID) | ORAL | 0 refills | Status: DC

## 2021-07-02 NOTE — Discharge Instructions (Signed)
Begin taking Keflex as prescribed. ? ?Plenty of fluids. ? ?Follow-up with primary doctor if not improving in the next week, and return to the ER for high fevers, vomiting, worsening pain, or other new and concerning symptoms. ?

## 2021-07-02 NOTE — ED Provider Notes (Signed)
[facility] EMERGENCY DEPARTMENT Provider Note   CSN: [id number] Arrival date & time: 07/01/21  2044     History  Chief Complaint  Patient presents with   Abdominal Pain    UTI    Jasmine Wheeler is a 76 y.o. female.  Patient is a 76 year old female with past medical history of hypertension, hyperlipidemia, SVT.  Patient presenting today for evaluation of dysuria and flank pain.  This has been worsening over the past 2 days.  Patient treated for UTI in January by primary doctor and this feels similar.  She denies any fevers or chills.  She denies any hematuria.  The history is provided by the patient.  Abdominal Pain Pain location:  R flank and RLQ Pain quality: cramping   Pain radiates to:  Does not radiate Pain severity:  Moderate Onset quality:  Gradual Duration:  1 day Timing:  Intermittent Progression:  Worsening Relieved by:  Nothing Worsened by:  Nothing     Home Medications Prior to Admission medications   Medication Sig Start Date End Date Taking? Authorizing Provider  albuterol (VENTOLIN HFA) 108 (90 Base) MCG/ACT inhaler INHALE 2 PUFFS EVERY SIX HOURS AS NEEDED FOR WHEEZING OR SHORTNESS OF BREATH 03/26/21   Nafziger, Kandee Keen, NP  Ascorbic Acid (VITAMIN C) 500 MG tablet daily. Take 2 tablet daily    [provider]  aspirin 81 MG EC tablet Take 1 tablet (81 mg total) by mouth daily with breakfast. 03/14/19   Mariea Clonts, Courage, MD  Calcium Carbonate-Vitamin D (CALCIUM-VITAMIN D) 500-200 MG-UNIT per tablet Take 1 tablet by mouth 2 (two) times daily with a meal.    [provider]  cetirizine (ZYRTEC) 10 MG tablet Take 10 mg by mouth daily.    [provider]  Cranberry 1000 MG CAPS Take by mouth. Take 2 tablets daily    [provider]  Evolocumab (REPATHA SURECLICK) 140 MG/ML SOAJ Inject 140 mg into the skin every 14 (fourteen) days. 06/13/21   Croitoru, Mihai, MD  fluticasone (FLONASE) 50 MCG/ACT nasal spray USE  2 SPRAYS INTO BOTH NOSTRILS DAILY. 11/28/20   Nafziger, Kandee Keen, NP  Melatonin 5 MG CAPS Take by mouth at bedtime.    [provider]  metoprolol succinate (TOPROL-XL) 100 MG 24 hr tablet Take 1 tablet (100 mg total) by mouth in the morning and at bedtime 05/13/21   Croitoru, Mihai, MD  Multiple Vitamin (MULTIVITAMIN) tablet Take 1 tablet by mouth daily.    [provider]  Multiple Vitamins-Minerals (VITAMIN D3 COMPLETE PO) Take by mouth.    [provider]  pantoprazole (PROTONIX) 40 MG tablet Take 1 tablet (40 mg total) by mouth daily before breakfast. 05/27/21   Iva Boop, MD  Peppermint Oil 90 MG CPCR Take 2 capsules by mouth 2 (two) times daily.    [provider]  quinapril (ACCUPRIL) 40 MG tablet TAKE 1 TABLET TWICE DAILY 02/27/21   Croitoru, Mihai, MD  spironolactone (ALDACTONE) 25 MG tablet TAKE 1 TABLET EVERY DAY 01/11/21   Croitoru, Mihai, MD  SYNTHROID 100 MCG tablet TAKE 1 TABLET EVERY MORNING BEFORE BREAKFAST 06/07/20   Nafziger, Kandee Keen, NP  triamterene-hydrochlorothiazide (MAXZIDE-25) 37.5-25 MG tablet TAKE 1/2 TABLET EVERY DAY 01/11/21   Croitoru, Mihai, MD  vitamin B-12 (CYANOCOBALAMIN) 500 MCG tablet Take 500 mcg by mouth daily.    [provider]      Allergies    Dicyclomine, Nifedipine, Amlodipine, Rosuvastatin, Simvastatin, and Theophylline    Review of Systems  Review of Systems  Gastrointestinal:  Positive for abdominal pain.  All other systems reviewed and are negative.  Physical Exam Updated Vital Signs BP (!) 153/80 (BP Location: Right Arm)   Pulse 78   Temp 98.1 F (36.7 C) (Oral)   Resp 17   Ht 5\' 2"  (1.575 m)   Wt 85 kg   SpO2 96%   BMI 34.27 kg/m  Physical Exam Vitals and nursing note reviewed.  Constitutional:      General: She is not in acute distress.    Appearance: She is well-developed. She is not diaphoretic.  HENT:     Head: Normocephalic and atraumatic.  Cardiovascular:     Rate and Rhythm: Normal  rate and regular rhythm.     Heart sounds: No murmur heard.   No friction rub. No gallop.  Pulmonary:     Effort: Pulmonary effort is normal. No respiratory distress.     Breath sounds: Normal breath sounds. No wheezing.  Abdominal:     General: Bowel sounds are normal. There is no distension.     Palpations: Abdomen is soft.     Tenderness: There is abdominal tenderness in the right lower quadrant. There is right CVA tenderness. There is no left CVA tenderness, guarding or rebound.  Musculoskeletal:        General: Normal range of motion.     Cervical back: Normal range of motion and neck supple.  Skin:    General: Skin is warm and dry.  Neurological:     General: No focal deficit present.     Mental Status: She is alert and oriented to person, place, and time.    ED Results / Procedures / Treatments   Labs (all labs ordered are listed, but only abnormal results are displayed) Labs Reviewed  CBC WITH DIFFERENTIAL/PLATELET - Abnormal; Notable for the following components:      Result Value   RBC 3.25 (*)    Hemoglobin 9.9 (*)    HCT 30.2 (*)    All other components within normal limits  COMPREHENSIVE METABOLIC PANEL - Abnormal; Notable for the following components:   Glucose, Bld 122 (*)    BUN 30 (*)    Creatinine, Ser 1.77 (*)    Albumin 3.4 (*)    GFR, Estimated 29 (*)    All other components within normal limits  LIPASE, BLOOD - Abnormal; Notable for the following components:   Lipase 52 (*)    All other components within normal limits  URINALYSIS, ROUTINE W REFLEX MICROSCOPIC - Abnormal; Notable for the following components:   APPearance CLOUDY (*)    Hgb urine dipstick MODERATE (*)    Protein, ur 100 (*)    Leukocytes,Ua LARGE (*)    WBC, UA >50 (*)    Bacteria, UA MANY (*)    All other components within normal limits  URINE CULTURE    EKG None  Radiology CT Renal Stone Study  Result Date: 07/01/2021 CLINICAL DATA:  Kidney stones. EXAM: CT ABDOMEN AND  PELVIS WITHOUT CONTRAST TECHNIQUE: Multidetector CT imaging of the abdomen and pelvis was performed following the standard protocol without IV contrast. RADIATION DOSE REDUCTION: This exam was performed according to the departmental dose-optimization program which includes automated exposure control, adjustment of the mA and/or kV according to patient size and/or use of iterative reconstruction technique. COMPARISON:  CT abdomen pelvis dated 04/27/2010. FINDINGS: Evaluation of this exam is limited in the absence of intravenous contrast. Lower chest: Small scattered calcified granuloma. A  1 cm subpleural nodule at the right lung base (23/5) may represent additional granuloma. Attention on follow-up imaging recommended. There is coronary vascular calcification. No intra-abdominal free air or free fluid. Hepatobiliary: No focal liver abnormality is seen. No gallstones, gallbladder wall thickening, or biliary dilatation. Pancreas: Unremarkable. No pancreatic ductal dilatation or surrounding inflammatory changes. Spleen: Small scattered calcified splenic granuloma. Adrenals/Urinary Tract: There is a 12 mm left adrenal nodule, decreased in size since the prior CT. This nodule is indeterminate but likely an adenoma. The right adrenal gland is unremarkable. There is no hydronephrosis or obstructing stone on either side. Punctate nonobstructing left renal inferior pole calculus versus vascular calcification. Bilateral renal cysts as well as additional hypodense lesions which are not characterized. There is a 2.4 x 1.9 cm nodular soft tissue lesion in the medial upper pole of the left kidney (25/3) which is not characterized on this noncontrast CT but may be postsurgical changes. Other etiologies are not excluded. Further initial evaluation with ultrasound on a nonemergent/outpatient basis recommended. Additional characterization with MRI may be needed depending on the sonographic findings. There is right perinephric haziness  which may be related to a recently passed stone or pyelonephritis. Correlation with urinalysis recommended. The urinary bladder is predominantly collapsed. Stomach/Bowel: There is sigmoid diverticulosis without active inflammatory changes. There are scattered colonic diverticula. There is no bowel obstruction or active inflammation. Appendectomy. Vascular/Lymphatic: Moderate aortoiliac atherosclerotic disease. The IVC is unremarkable. No portal venous gas. There is no adenopathy. Reproductive: Hysterectomy.  No adnexal masses. Other: None Musculoskeletal: Osteopenia with degenerative changes of the spine. No acute osseous pathology. IMPRESSION: 1. No hydronephrosis or obstructing stone on either side. 2. Right perinephric haziness may be related to a recently passed stone or pyelonephritis. Correlation with urinalysis recommended. 3. Colonic diverticulosis. No bowel obstruction. 4. A 2.4 x 1.9 cm nodular soft tissue lesion in the medial upper pole of the left kidney (25/3). Further evaluation with ultrasound or MRI on a nonemergent/outpatient basis recommended. 5. Aortic Atherosclerosis (ICD10-I70.0). Electronically Signed   By: Elgie Collard M.D.   On: 07/01/2021 23:15    Procedures Procedures    Medications Ordered in ED Medications  cefTRIAXone (ROCEPHIN) injection 1 g (has no administration in time range)    ED Course/ Medical Decision Making/ A&P   This patient presents to the ED for concern of flank pain and dysuria, this involves an extensive number of treatment options, and is a complaint that carries with it a high risk of complications and morbidity.  The differential diagnosis includes urinary tract infection, renal calculus   Co morbidities that complicate the patient evaluation  None   Additional history obtained:  None   Lab Tests:  I Ordered, and personally interpreted labs.  The pertinent results include: CBC, basic metabolic panel, and urinalysis.  Findings consistent  with a UTI.   Imaging Studies ordered:  I ordered imaging studies including CT renal I independently visualized and interpreted imaging which showed mild haziness around the right kidney consistent with either passed kidney stone or pyelonephritis. I agree with the radiologist interpretation   Cardiac Monitoring:  None   Medicines ordered and prescription drug management:  I ordered medication including Rocephin for UTI/infection Reevaluation of the patient after these medicines showed that the patient improved I have reviewed the patients home medicines and have made adjustments as needed   Test Considered:  None   Critical Interventions:  None   Consultations Obtained:  No consultations indicated   Problem List / ED  Course:  Patient presenting with dysuria and right flank pain.  Patient to be treated for urinary tract infection for UA results with IM Rocephin and discharged with Keflex.  CT scan shows possible passed stone, but no obstructive uropathy.    Social Determinants of Health:  None                            Final Clinical Impression(s) / ED Diagnoses Final diagnoses:  None    Rx / DC Orders ED Discharge Orders     None         Geoffery Lyons, MD 07/02/21 0225

## 2021-07-12 ENCOUNTER — Other Ambulatory Visit: Payer: Self-pay | Admitting: Adult Health

## 2021-07-20 ENCOUNTER — Encounter: Payer: Self-pay | Admitting: Emergency Medicine

## 2021-07-20 ENCOUNTER — Ambulatory Visit
Admission: EM | Admit: 2021-07-20 | Discharge: 2021-07-20 | Disposition: A | Discharge status: Home / Self Care | Payer: Medicare HMO | Attending: Urgent Care | Admitting: Urgent Care

## 2021-07-20 DIAGNOSIS — N3001 Acute cystitis with hematuria: Secondary | ICD-10-CM

## 2021-07-20 DIAGNOSIS — Z87448 Personal history of other diseases of urinary system: Secondary | ICD-10-CM

## 2021-07-20 DIAGNOSIS — N183 Chronic kidney disease, stage 3 unspecified: Secondary | ICD-10-CM

## 2021-07-20 LAB — POCT URINALYSIS DIP (MANUAL ENTRY)
Bilirubin, UA: NEGATIVE
Glucose, UA: NEGATIVE mg/dL
Ketones, POC UA: NEGATIVE mg/dL
Nitrite, UA: NEGATIVE
Spec Grav, UA: 1.01 (ref 1.010–1.025)
Urobilinogen, UA: 0.2 E.U./dL
pH, UA: 6 (ref 5.0–8.0)

## 2021-07-20 MED ORDER — CEPHALEXIN 500 MG PO CAPS: 500.0000 mg | ORAL_CAPSULE | Freq: Two times a day (BID) | ORAL | 0 refills | Status: DC

## 2021-07-20 NOTE — ED Provider Notes (Signed)
?[place] ? ? ?MRN: [id number] DOB: [birth year] ? ?Subjective:  ? ?Jasmine Wheeler is a 76 y.o. female presenting for acute onset since this morning dysuria, urinary frequency, right flank pain.  Patient has had recurrent UTIs this year, last episode resulted with pyelonephritis.  Today, she denies fever, vomiting, frank hematuria.  Does not feel like she is at the level she was with her pyelonephritis.  The last antibiotic she took she tolerated well.  She does have a history of CKD stage III. ? ?No current facility-administered medications for this encounter. ? ?Current Outpatient Medications:  ?  albuterol (VENTOLIN HFA) 108 (90 Base) MCG/ACT inhaler, INHALE 2 PUFFS EVERY SIX HOURS AS NEEDED FOR WHEEZING OR SHORTNESS OF BREATH, Disp: 3 each, Rfl: 0 ?  Ascorbic Acid (VITAMIN C) 500 MG tablet, daily. Take 2 tablet daily, Disp: , Rfl:  ?  aspirin 81 MG EC tablet, Take 1 tablet (81 mg total) by mouth daily with breakfast., Disp: 30 tablet, Rfl: 12 ?  Calcium Carbonate-Vitamin D (CALCIUM-VITAMIN D) 500-200 MG-UNIT per tablet, Take 1 tablet by mouth 2 (two) times daily with a meal., Disp: , Rfl:  ?  cephALEXin (KEFLEX) 500 MG capsule, Take 1 capsule (500 mg total) by mouth 3 (three) times daily., Disp: 21 capsule, Rfl: 0 ?  cetirizine (ZYRTEC) 10 MG tablet, Take 10 mg by mouth daily., Disp: , Rfl:  ?  Cranberry 1000 MG CAPS, Take by mouth. Take 2 tablets daily, Disp: , Rfl:  ?  Evolocumab (REPATHA SURECLICK) 229 MG/ML SOAJ, Inject 140 mg into the skin every 14 (fourteen) days., Disp: 6 mL, Rfl: 3 ?  fluticasone (FLONASE) 50 MCG/ACT nasal spray, USE 2 SPRAYS INTO BOTH NOSTRILS DAILY., Disp: 48 g, Rfl: 1 ?  Melatonin 5 MG CAPS, Take by mouth at bedtime., Disp: , Rfl:  ?  metoprolol succinate (TOPROL-XL) 100 MG 24 hr tablet, Take 1 tablet (100 mg total) by mouth in the morning and at bedtime, Disp: 180 tablet, Rfl: 3 ?  Multiple Vitamin (MULTIVITAMIN) tablet, Take 1 tablet by mouth daily., Disp: ,  Rfl:  ?  Multiple Vitamins-Minerals (VITAMIN D3 COMPLETE PO), Take by mouth., Disp: , Rfl:  ?  pantoprazole (PROTONIX) 40 MG tablet, Take 1 tablet (40 mg total) by mouth daily before breakfast., Disp: 90 tablet, Rfl: 3 ?  Peppermint Oil 90 MG CPCR, Take 2 capsules by mouth 2 (two) times daily., Disp: , Rfl:  ?  quinapril (ACCUPRIL) 40 MG tablet, TAKE 1 TABLET TWICE DAILY, Disp: 180 tablet, Rfl: 2 ?  spironolactone (ALDACTONE) 25 MG tablet, TAKE 1 TABLET EVERY DAY, Disp: 90 tablet, Rfl: 3 ?  SYNTHROID 100 MCG tablet, TAKE 1 TABLET EVERY MORNING BEFORE BREAKFAST, Disp: 90 tablet, Rfl: 3 ?  triamterene-hydrochlorothiazide (MAXZIDE-25) 37.5-25 MG tablet, TAKE 1/2 TABLET EVERY DAY, Disp: 45 tablet, Rfl: 3 ?  vitamin B-12 (CYANOCOBALAMIN) 500 MCG tablet, Take 500 mcg by mouth daily., Disp: , Rfl:   ? ?Allergies  ?Allergen Reactions  ? Dicyclomine Nausea Only and Other (See Comments)  ?  Nausea, tingling in her whole body, couldn't sleep, made lights bright  ? Nifedipine Other (See Comments)  ?  Dropped blood pressure and heart rate too much  ? Amlodipine   ?  Dizzy and feel like almost passing out  ? Nitrofurantoin Nausea And Vomiting  ? Rosuvastatin   ?  MYALGIA  ? Simvastatin   ?  MYALGIA  ? Theophylline Other (See Comments)  ?  Patient cannot recall  reaction  ? ? ?Past Medical History:  ?Diagnosis Date  ? Abnormality of aortic valve   ? enlarged   ? Adenomatous colon polyp   ? Allergy   ? Arthritis   ? back   ? Asthma   ? Benign gastric polyp - hyperplastic 09/2017  ? Cancer [facility])   ? Melanoma left arm   ? Cataract   ? forming  ? Chronic kidney disease   ? CKD III  ? Chronic low back pain   ? Diverticulosis 01/23/2010  ? left colon  ? GERD (gastroesophageal reflux disease)   ? Heart murmur 2016  ? Hyperlipidemia   ? controlled with Repatha  ? Hypertension   ? Hypothyroidism   ? IBS (irritable bowel syndrome)   ? Osteopenia   ? Spinal stenosis   ? SVT (supraventricular tachycardia) ([place])   ?  ? ?Past Surgical History:   ?Procedure Laterality Date  ? ABDOMINAL HYSTERECTOMY    ? COLON SURGERY  2011  ? hemicolectomy for a TA polyp  ? COLONOSCOPY    ? COLONOSCOPY W/ BIOPSIES  multiple  ? COSMETIC SURGERY    ? from   [place]   ? HEMICOLECTOMY  02/15/2010  ? right, tubulovillous adenoma appendix, Dr. Georgette Dover  ? HEMORRHOID BANDING  2014  ? LUMBAR EPIDURAL INJECTION  2011  ? x 2  ? MELANOMA EXCISION Left 2012  ? left arm, not sure about lymph nodes  ? POLYPECTOMY    ? REPEAT CESAREAN SECTION    ? 3 in all, last 1973  ? SHOULDER SURGERY Bilateral   ? x2  ? ? ?Family History  ?Problem Relation Age of Onset  ? Colon cancer Mother 73  ? Kidney cancer Mother   ? Lung cancer Father   ? Heart disease Father   ? Cervical cancer Sister   ? Colon cancer Maternal Uncle   ? Colon cancer Maternal Uncle   ? Colon polyps Neg Hx   ? Esophageal cancer Neg Hx   ? Rectal cancer Neg Hx   ? Stomach cancer Neg Hx   ? ? ?Social History  ? ?Tobacco Use  ? Smoking status: Never  ? Smokeless tobacco: Never  ?Vaping Use  ? Vaping Use: Never used  ?Substance Use Topics  ? Alcohol use: No  ? Drug use: No  ? ? ?ROS ? ? ?Objective:  ? ?Vitals: ?BP (!) 180/98 (BP Location: Right Arm)   Pulse 68   Temp 98.2 ?F (36.8 ?C) (Oral)   Resp 18   SpO2 96%  ? ?Physical Exam ?Constitutional:   ?   General: She is not in acute distress. ?   Appearance: Normal appearance. She is well-developed. She is not ill-appearing, toxic-appearing or diaphoretic.  ?HENT:  ?   Head: Normocephalic and atraumatic.  ?   Nose: Nose normal.  ?   Mouth/Throat:  ?   Mouth: Mucous membranes are moist.  ?   Pharynx: Oropharynx is clear.  ?Eyes:  ?   General: No scleral icterus.    ?   Right eye: No discharge.     ?   Left eye: No discharge.  ?   Extraocular Movements: Extraocular movements intact.  ?   Conjunctiva/sclera: Conjunctivae normal.  ?Cardiovascular:  ?   Rate and Rhythm: Normal rate.  ?Pulmonary:  ?   Effort: Pulmonary effort is normal.  ?Abdominal:  ?   General: Bowel sounds are normal. There  is no distension.  ?   Palpations: Abdomen  is soft. There is no mass.  ?   Tenderness: There is no abdominal tenderness. There is no right CVA tenderness, left CVA tenderness, guarding or rebound.  ?Skin: ?   General: Skin is warm and dry.  ?Neurological:  ?   General: No focal deficit present.  ?   Mental Status: She is alert and oriented to person, place, and time.  ?Psychiatric:     ?   Mood and Affect: Mood normal.     ?   Behavior: Behavior normal.     ?   Thought Content: Thought content normal.     ?   Judgment: Judgment normal.  ? ? ?Results for orders placed or performed during the hospital encounter of 07/20/21 (from the past 24 hour(s))  ?POCT urinalysis dipstick     Status: Abnormal  ? Collection Time: 07/20/21 11:40 AM  ?Result Value Ref Range  ? Color, UA yellow yellow  ? Clarity, UA cloudy (A) clear  ? Glucose, UA negative negative mg/dL  ? Bilirubin, UA negative negative  ? Ketones, POC UA negative negative mg/dL  ? Spec Grav, UA 1.010 1.010 - 1.025  ? Blood, UA large (A) negative  ? pH, UA 6.0 5.0 - 8.0  ? Protein Ur, POC trace (A) negative mg/dL  ? Urobilinogen, UA 0.2 0.2 or 1.0 E.U./dL  ? Nitrite, UA Negative Negative  ? Leukocytes, UA Large (3+) (A) Negative  ? ? ?Assessment and Plan :  ? ?PDMP not reviewed this encounter. ? ?1. Acute cystitis with hematuria   ?2. History of pyelonephritis   ?3. Stage 3 chronic kidney disease, unspecified whether stage 3a or 3b CKD ([place])   ? ?Based off of the last urine culture, Keflex is a very reasonable option to address her recurrent cystitis.  This is also agreeable with her CKD.  Recommended that she just hydrate with plain water.  Avoid urinary irritants as much as possible. Counseled patient on potential for adverse effects with medications prescribed/recommended today, ER and return-to-clinic precautions discussed, patient verbalized understanding. ? ?  ?Jaynee Eagles, PA-C ?07/20/21 1208 ? ?

## 2021-07-20 NOTE — ED Triage Notes (Signed)
Hx of UTI on March 13th.  Urinary frequency with burning on urination since this morning. ?

## 2021-07-20 NOTE — Discharge Instructions (Addendum)

## 2021-07-22 LAB — URINE CULTURE: Culture: 20000 — AB

## 2021-07-24 ENCOUNTER — Encounter (HOSPITAL_COMMUNITY): Payer: Self-pay | Admitting: Radiology

## 2021-07-29 DIAGNOSIS — N1832 Chronic kidney disease, stage 3b: Secondary | ICD-10-CM | POA: Diagnosis not present

## 2021-07-31 ENCOUNTER — Ambulatory Visit (INDEPENDENT_AMBULATORY_CARE_PROVIDER_SITE_OTHER): Payer: Medicare HMO | Admitting: Family Medicine

## 2021-07-31 ENCOUNTER — Encounter: Payer: Self-pay | Admitting: Family Medicine

## 2021-07-31 VITALS — BP 181/85 | HR 56 | Temp 98.0°F | Wt 174.0 lb

## 2021-07-31 DIAGNOSIS — I10 Essential (primary) hypertension: Secondary | ICD-10-CM

## 2021-07-31 DIAGNOSIS — R300 Dysuria: Secondary | ICD-10-CM | POA: Diagnosis not present

## 2021-07-31 LAB — POCT URINALYSIS DIPSTICK
Bilirubin, UA: NEGATIVE
Glucose, UA: NEGATIVE
Ketones, UA: NEGATIVE
Nitrite, UA: NEGATIVE
Protein, UA: NEGATIVE
Spec Grav, UA: 1.015 (ref 1.010–1.025)
Urobilinogen, UA: NEGATIVE E.U./dL — AB
pH, UA: 6 (ref 5.0–8.0)

## 2021-07-31 NOTE — Progress Notes (Signed)
Subjective:  ? ? Patient ID: Jasmine Wheeler, female    DOB: [birth year], 76 y.o.   MRN: [id number] ? ?Chief Complaint  ?Patient presents with  ? Urinary Tract Infection  ?  Burning, woke up Monday with same symptoms, took AZO the past 2 days, not much urgency today, as before.  ?UTI in Jan and [facility], and first of April on Keflex.   ? ? ?HPI ?Patient was seen today for ongoing concern.  Patient endorses history of frequent UTIs over the last few months.  Patient given Macrobid in January for E. coli UTI.  Keflex in March for pyelonephritis.  Seen 4/1 at [facility] given Keflex again x7 days.  Patient states she completed Keflex on Friday and developed dysuria and frequency on Monday.  Also had mild achiness in lower abdomen.  Patient tried Azo.  Drinking at least 48 ounces of water daily.  Pt does not drink caffeine as it causes palpitations.  Notes some improvement in symptoms today.  Denies fever, chills, nausea, vomiting, back pain, constipation.  Followed by nephrology for history of CKD 3. ? ?States BP elevated when in clinic.  BP 102/70 at home.  Took medication this morning. ?Past Medical History:  ?Diagnosis Date  ? Abnormality of aortic valve   ? enlarged   ? Adenomatous colon polyp   ? Allergy   ? Arthritis   ? back   ? Asthma   ? Benign gastric polyp - hyperplastic 09/2017  ? Cancer [facility])   ? Melanoma left arm   ? Cataract   ? forming  ? Chronic kidney disease   ? CKD III  ? Chronic low back pain   ? Diverticulosis 01/23/2010  ? left colon  ? GERD (gastroesophageal reflux disease)   ? Heart murmur 2016  ? Hyperlipidemia   ? controlled with Repatha  ? Hypertension   ? Hypothyroidism   ? IBS (irritable bowel syndrome)   ? Osteopenia   ? Spinal stenosis   ? SVT (supraventricular tachycardia) ([place])   ? ? ?Allergies  ?Allergen Reactions  ? Dicyclomine Nausea Only and Other (See Comments)  ?  Nausea, tingling in her whole body, couldn't sleep, made lights bright  ? Nifedipine Other (See Comments)  ?  Dropped  blood pressure and heart rate too much  ? Amlodipine   ?  Dizzy and feel like almost passing out  ? Nitrofurantoin Nausea And Vomiting  ? Rosuvastatin   ?  MYALGIA  ? Simvastatin   ?  MYALGIA  ? Theophylline Other (See Comments)  ?  Patient cannot recall reaction  ? ? ?ROS ?General: Denies fever, chills, night sweats, changes in weight, changes in appetite ?HEENT: Denies headaches, ear pain, changes in vision, rhinorrhea, sore throat ?CV: Denies CP, palpitations, SOB, orthopnea ?Pulm: Denies SOB, cough, wheezing ?GI: Denies abdominal pain, nausea, vomiting, diarrhea, constipation  +achy lower abd, h/o IBS-D ?GU: Denies hematuria, vaginal discharge  +frequency, dysuria ?Msk: Denies muscle cramps, joint pains ?Neuro: Denies weakness, numbness, tingling ?Skin: Denies rashes, bruising ?Psych: Denies depression, anxiety, hallucinations ?   ?Objective:  ?  ?Blood pressure (!) 181/85, pulse (!) 56, temperature 98 ?F (36.7 ?C), temperature source Oral, weight 174 lb (78.9 kg), SpO2 97 %. ? ?Gen. Pleasant, well-nourished, in no distress, normal affect   ?HEENT: [place]/AT, face symmetric, conjunctiva clear, no scleral icterus, PERRLA, EOMI, nares patent without drainage ?Lungs: no accessory muscle use, ?Cardiovascular: RRR, no peripheral edema ?Abdomen: BS present, soft, NT/ND, no hepatosplenomegaly.  No  CVA tenderness ?Musculoskeletal: No deformities, no cyanosis or clubbing, normal tone ?Neuro:  A&Ox3, CN II-XII intact, normal gait ?Skin:  Warm, no lesions/ rash ? ? ?Wt Readings from Last 3 Encounters:  ?07/31/21 174 lb (78.9 kg)  ?07/01/21 187 lb 6.3 oz (85 kg)  ?06/07/21 172 lb (78 kg)  ? ? ?Lab Results  ?Component Value Date  ? WBC 7.7 07/01/2021  ? HGB 9.9 (L) 07/01/2021  ? HCT 30.2 (L) 07/01/2021  ? PLT 260 07/01/2021  ? GLUCOSE 122 (H) 07/01/2021  ? CHOL 130 06/07/2021  ? TRIG 135.0 06/07/2021  ? HDL 45.10 06/07/2021  ? [place] 58 06/07/2021  ? ALT 15 07/01/2021  ? AST 16 07/01/2021  ? NA 135 07/01/2021  ? K 4.8  07/01/2021  ? CL 101 07/01/2021  ? CREATININE 1.77 (H) 07/01/2021  ? BUN 30 (H) 07/01/2021  ? CO2 23 07/01/2021  ? TSH 1.58 06/07/2021  ? INR 1.06 04/26/2010  ? HGBA1C 5.7 06/07/2021  ? ? ?Assessment/Plan: ? ?Dysuria  ?-POC UA ?-Discussed obtaining UCX given history of recent E. coli UTI is resistant to ampicillin, Cipro, and levofloxacin ?-Continue increasing p.o. intake of water and fluids ?-We will start ABX based on culture results ?-Given recurrent UTIs discussed likely need to follow-up with urology.   ?-Given strict precautions ?- Plan: POCT urinalysis dipstick, Urine Culture ? ?Essential hypertension ?-Elevated in clinic likely 2/2 white coat HTN as patient reports BP controlled at home ?-Continue current medications ?-Continue follow-up with nephrology ? ?F/u as needed ? ?Grier Mitts, MD ?

## 2021-08-02 ENCOUNTER — Other Ambulatory Visit: Payer: Self-pay | Admitting: Family Medicine

## 2021-08-02 DIAGNOSIS — N3000 Acute cystitis without hematuria: Secondary | ICD-10-CM

## 2021-08-02 LAB — URINE CULTURE
MICRO NUMBER:: 13254191
SPECIMEN QUALITY:: ADEQUATE

## 2021-08-02 MED ORDER — AMOXICILLIN-POT CLAVULANATE 500-125 MG PO TABS: 1.0000 | ORAL_TABLET | Freq: Two times a day (BID) | ORAL | 0 refills | Status: AC

## 2021-08-05 ENCOUNTER — Encounter: Payer: Self-pay | Admitting: Cardiovascular Disease

## 2021-08-05 DIAGNOSIS — D631 Anemia in chronic kidney disease: Secondary | ICD-10-CM | POA: Diagnosis not present

## 2021-08-05 DIAGNOSIS — N1832 Chronic kidney disease, stage 3b: Secondary | ICD-10-CM | POA: Diagnosis not present

## 2021-08-05 DIAGNOSIS — N289 Disorder of kidney and ureter, unspecified: Secondary | ICD-10-CM | POA: Diagnosis not present

## 2021-08-05 DIAGNOSIS — I129 Hypertensive chronic kidney disease with stage 1 through stage 4 chronic kidney disease, or unspecified chronic kidney disease: Secondary | ICD-10-CM | POA: Diagnosis not present

## 2021-08-05 NOTE — Telephone Encounter (Signed)
Called and spoke with the patient. She stated that her Nephrologist reduced her Quinapril to one daily. She has had 4 UTIs since January. She also stated that she had a CT that shows a nodule on her kidney, a collapsed bladder and is also anemic.  ? ?She has been referred to a Urologist. She has been advised to keep [place] updated and let us know if anything is needed.  ? ? ?

## 2021-08-05 NOTE — Telephone Encounter (Signed)
Thank you  for update

## 2021-08-06 ENCOUNTER — Other Ambulatory Visit: Payer: Self-pay | Admitting: Nephrology

## 2021-08-06 ENCOUNTER — Telehealth: Payer: Self-pay | Admitting: Adult Health

## 2021-08-06 ENCOUNTER — Other Ambulatory Visit (HOSPITAL_COMMUNITY): Payer: Self-pay | Admitting: Nephrology

## 2021-08-06 DIAGNOSIS — N1832 Chronic kidney disease, stage 3b: Secondary | ICD-10-CM

## 2021-08-06 NOTE — Telephone Encounter (Signed)
fyi

## 2021-08-06 NOTE — Telephone Encounter (Signed)
Patient called because she would like to discuss a referral to urology and her results in depth. I set her up with a telephone appointment for tomorrow but patient was hoping for a phone call today if possible. I let her know that he was fully booked so she may not get a call. ? ? ? ? ? ?Please advise  ?

## 2021-08-07 ENCOUNTER — Encounter: Payer: Self-pay | Admitting: Adult Health

## 2021-08-07 ENCOUNTER — Telehealth (INDEPENDENT_AMBULATORY_CARE_PROVIDER_SITE_OTHER): Payer: Medicare HMO | Admitting: Adult Health

## 2021-08-07 VITALS — BP 97/65 | HR 67 | Ht 62.0 in | Wt 169.0 lb

## 2021-08-07 DIAGNOSIS — N390 Urinary tract infection, site not specified: Secondary | ICD-10-CM | POA: Diagnosis not present

## 2021-08-07 NOTE — Progress Notes (Signed)
Virtual Visit via Video Note ? ?I connected with Tacey Heap on 08/07/21 at 11:30 AM EDT by a video enabled telemedicine application and verified that I am speaking with the correct person using two identifiers. ? Location patient: home ?Location provider:work or home office ?Persons participating in the virtual visit: patient, provider ? ?I discussed the limitations of evaluation and management by telemedicine and the availability of in person appointments. The patient expressed understanding and agreed to proceed. ? ? ?HPI: ?76 year old female who  has a past medical history of Abnormality of aortic valve, Adenomatous colon polyp, Allergy, Arthritis, Asthma, Benign gastric polyp - hyperplastic (09/2017), Cancer ([place]), Cataract, Chronic kidney disease, Chronic low back pain, Diverticulosis (01/23/2010), GERD (gastroesophageal reflux disease), Heart murmur (2016), Hyperlipidemia, Hypertension, Hypothyroidism, IBS (irritable bowel syndrome), Osteopenia, Spinal stenosis, and SVT (supraventricular tachycardia) ([place]). ? ?Had frequent UTIs over the last few months.  Most recently was prescribed Macrobid for E. coli UTI in January, Keflex in March for pyelonephritis, was seen April 1 at urgent care and given 7-day course of Keflex and then on April 12 she developed dysuria and culture showed another urinary tract infection.  She was placed on Augmentin and continues this medication today.  She was advised by the urgent care physician to have her PCP refer her to urology for frequent UTIs. ? ?ROS: See pertinent positives and negatives per HPI. ? ?Past Medical History:  ?Diagnosis Date  ? Abnormality of aortic valve   ? enlarged   ? Adenomatous colon polyp   ? Allergy   ? Arthritis   ? back   ? Asthma   ? Benign gastric polyp - hyperplastic 09/2017  ? Cancer [facility])   ? Melanoma left arm   ? Cataract   ? forming  ? Chronic kidney disease   ? CKD III  ? Chronic low back pain   ? Diverticulosis 01/23/2010  ? left colon  ?  GERD (gastroesophageal reflux disease)   ? Heart murmur 2016  ? Hyperlipidemia   ? controlled with Repatha  ? Hypertension   ? Hypothyroidism   ? IBS (irritable bowel syndrome)   ? Osteopenia   ? Spinal stenosis   ? SVT (supraventricular tachycardia) ([place])   ? ? ?Past Surgical History:  ?Procedure Laterality Date  ? ABDOMINAL HYSTERECTOMY    ? COLON SURGERY  2011  ? hemicolectomy for a TA polyp  ? COLONOSCOPY    ? COLONOSCOPY W/ BIOPSIES  multiple  ? COSMETIC SURGERY    ? from   [place]   ? HEMICOLECTOMY  02/15/2010  ? right, tubulovillous adenoma appendix, Dr. Georgette Dover  ? HEMORRHOID BANDING  2014  ? LUMBAR EPIDURAL INJECTION  2011  ? x 2  ? MELANOMA EXCISION Left 2012  ? left arm, not sure about lymph nodes  ? POLYPECTOMY    ? REPEAT CESAREAN SECTION    ? 3 in all, last 1973  ? SHOULDER SURGERY Bilateral   ? x2  ? ? ?Family History  ?Problem Relation Age of Onset  ? Colon cancer Mother 56  ? Kidney cancer Mother   ? Lung cancer Father   ? Heart disease Father   ? Cervical cancer Sister   ? Colon cancer Maternal Uncle   ? Colon cancer Maternal Uncle   ? Colon polyps Neg Hx   ? Esophageal cancer Neg Hx   ? Rectal cancer Neg Hx   ? Stomach cancer Neg Hx   ? ? ? ? ? ?Current Outpatient  Medications:  ?  albuterol (VENTOLIN HFA) 108 (90 Base) MCG/ACT inhaler, INHALE 2 PUFFS EVERY SIX HOURS AS NEEDED FOR WHEEZING OR SHORTNESS OF BREATH, Disp: 3 each, Rfl: 0 ?  amoxicillin-clavulanate (AUGMENTIN) 500-125 MG tablet, Take 1 tablet (500 mg total) by mouth in the morning and at bedtime for 7 days., Disp: 14 tablet, Rfl: 0 ?  Ascorbic Acid (VITAMIN C) 500 MG tablet, daily. Take 2 tablet daily, Disp: , Rfl:  ?  aspirin 81 MG EC tablet, Take 1 tablet (81 mg total) by mouth daily with breakfast., Disp: 30 tablet, Rfl: 12 ?  Calcium Carbonate-Vitamin D (CALCIUM-VITAMIN D) 500-200 MG-UNIT per tablet, Take 1 tablet by mouth 2 (two) times daily with a meal., Disp: , Rfl:  ?  cephALEXin (KEFLEX) 500 MG capsule, Take 1 capsule (500 mg total)  by mouth 2 (two) times daily., Disp: 14 capsule, Rfl: 0 ?  cetirizine (ZYRTEC) 10 MG tablet, Take 10 mg by mouth daily., Disp: , Rfl:  ?  Cranberry 1000 MG CAPS, Take by mouth. Take 2 tablets daily, Disp: , Rfl:  ?  Evolocumab (REPATHA SURECLICK) 053 MG/ML SOAJ, Inject 140 mg into the skin every 14 (fourteen) days., Disp: 6 mL, Rfl: 3 ?  fluticasone (FLONASE) 50 MCG/ACT nasal spray, USE 2 SPRAYS INTO BOTH NOSTRILS DAILY., Disp: 48 g, Rfl: 1 ?  Melatonin 5 MG CAPS, Take by mouth at bedtime., Disp: , Rfl:  ?  metoprolol succinate (TOPROL-XL) 100 MG 24 hr tablet, Take 1 tablet (100 mg total) by mouth in the morning and at bedtime, Disp: 180 tablet, Rfl: 3 ?  Multiple Vitamin (MULTIVITAMIN) tablet, Take 1 tablet by mouth daily., Disp: , Rfl:  ?  Multiple Vitamins-Minerals (VITAMIN D3 COMPLETE PO), Take by mouth., Disp: , Rfl:  ?  pantoprazole (PROTONIX) 40 MG tablet, Take 1 tablet (40 mg total) by mouth daily before breakfast., Disp: 90 tablet, Rfl: 3 ?  Peppermint Oil 90 MG CPCR, Take 2 capsules by mouth 2 (two) times daily., Disp: , Rfl:  ?  quinapril (ACCUPRIL) 40 MG tablet, TAKE 1 TABLET TWICE DAILY, Disp: 180 tablet, Rfl: 2 ?  spironolactone (ALDACTONE) 25 MG tablet, TAKE 1 TABLET EVERY DAY, Disp: 90 tablet, Rfl: 3 ?  SYNTHROID 100 MCG tablet, TAKE 1 TABLET EVERY MORNING BEFORE BREAKFAST, Disp: 90 tablet, Rfl: 3 ?  triamterene-hydrochlorothiazide (MAXZIDE-25) 37.5-25 MG tablet, TAKE 1/2 TABLET EVERY DAY, Disp: 45 tablet, Rfl: 3 ?  vitamin B-12 (CYANOCOBALAMIN) 500 MCG tablet, Take 500 mcg by mouth daily., Disp: , Rfl:  ? ?EXAM: ? ?VITALS per patient if applicable: ? ?GENERAL: alert, oriented, appears well and in no acute distress ? ?HEENT: atraumatic, conjunttiva clear, no obvious abnormalities on inspection of external nose and ears ? ?NECK: normal movements of the head and neck ? ?LUNGS: on inspection no signs of respiratory distress, breathing rate appears normal, no obvious gross SOB, gasping or wheezing ? ?CV:  no obvious cyanosis ? ?MS: moves all visible extremities without noticeable abnormality ? ?PSYCH/NEURO: pleasant and cooperative, no obvious depression or anxiety, speech and thought processing grossly intact ? ?ASSESSMENT AND PLAN: ? ?Discussed the following assessment and plan: ? ?1. Frequent UTI ?- Ambulatory referral to Urology ? ? ? ?  ?I discussed the assessment and treatment plan with the patient. The patient was provided an opportunity to ask questions and all were answered. The patient agreed with the plan and demonstrated an understanding of the instructions. ?  ?The patient was advised to call back or  seek an in-person evaluation if the symptoms worsen or if the condition fails to improve as anticipated. ? ? ?Dorothyann Peng, NP  ? ?

## 2021-08-12 ENCOUNTER — Encounter: Payer: Self-pay | Admitting: *Deleted

## 2021-08-12 DIAGNOSIS — R300 Dysuria: Secondary | ICD-10-CM | POA: Diagnosis not present

## 2021-08-12 DIAGNOSIS — N959 Unspecified menopausal and perimenopausal disorder: Secondary | ICD-10-CM | POA: Diagnosis not present

## 2021-08-12 DIAGNOSIS — Z01419 Encounter for gynecological examination (general) (routine) without abnormal findings: Secondary | ICD-10-CM | POA: Diagnosis not present

## 2021-08-19 ENCOUNTER — Ambulatory Visit (HOSPITAL_COMMUNITY)
Admission: RE | Admit: 2021-08-19 | Discharge: 2021-08-19 | Disposition: A | Discharge status: Home / Self Care | Payer: Medicare HMO | Source: Ambulatory Visit | Attending: Nephrology | Admitting: Nephrology

## 2021-08-19 DIAGNOSIS — N1832 Chronic kidney disease, stage 3b: Secondary | ICD-10-CM | POA: Insufficient documentation

## 2021-08-19 DIAGNOSIS — N189 Chronic kidney disease, unspecified: Secondary | ICD-10-CM | POA: Diagnosis not present

## 2021-08-19 DIAGNOSIS — N281 Cyst of kidney, acquired: Secondary | ICD-10-CM | POA: Diagnosis not present

## 2021-08-20 ENCOUNTER — Encounter: Payer: Self-pay | Admitting: Urology

## 2021-08-20 ENCOUNTER — Ambulatory Visit: Payer: Medicare HMO | Admitting: Urology

## 2021-08-20 VITALS — BP 163/82 | HR 62 | Ht 62.0 in | Wt 169.0 lb

## 2021-08-20 DIAGNOSIS — N390 Urinary tract infection, site not specified: Secondary | ICD-10-CM | POA: Diagnosis not present

## 2021-08-20 DIAGNOSIS — R8290 Unspecified abnormal findings in urine: Secondary | ICD-10-CM

## 2021-08-20 DIAGNOSIS — Z87440 Personal history of urinary (tract) infections: Secondary | ICD-10-CM

## 2021-08-20 DIAGNOSIS — N281 Cyst of kidney, acquired: Secondary | ICD-10-CM | POA: Insufficient documentation

## 2021-08-20 DIAGNOSIS — N2889 Other specified disorders of kidney and ureter: Secondary | ICD-10-CM

## 2021-08-20 LAB — BLADDER SCAN AMB NON-IMAGING: Scan Result: 25

## 2021-08-20 LAB — URINALYSIS, ROUTINE W REFLEX MICROSCOPIC
Bilirubin, UA: NEGATIVE
Glucose, UA: NEGATIVE
Ketones, UA: NEGATIVE
Nitrite, UA: NEGATIVE
Specific Gravity, UA: 1.02 (ref 1.005–1.030)
Urobilinogen, Ur: 0.2 mg/dL (ref 0.2–1.0)
pH, UA: 5.5 (ref 5.0–7.5)

## 2021-08-20 LAB — MICROSCOPIC EXAMINATION
Renal Epithel, UA: NONE SEEN /hpf
WBC, UA: >30 /hpf — AB (ref 0–5)

## 2021-08-20 MED ORDER — CEFDINIR 300 MG PO CAPS: 300.0000 mg | ORAL_CAPSULE | Freq: Two times a day (BID) | ORAL | 0 refills | Status: AC

## 2021-08-20 MED ORDER — PREMARIN 0.625 MG/GM VA CREA: TOPICAL_CREAM | VAGINAL | 12 refills | Status: DC

## 2021-08-20 NOTE — Progress Notes (Signed)
post void residual =25mL 

## 2021-08-20 NOTE — Addendum Note (Signed)
Addended by: Primus Bravo on: 08/20/2021 02:54 PM ? ? Modules accepted: Orders ? ?

## 2021-08-20 NOTE — Progress Notes (Signed)
? ?Assessment: ?1. Frequent UTI   ?2. Left renal mass   ?3. Abnormal urine findings   ? ? ?Plan: ?I reviewed the patient's chart including lab results and imaging studies.  I reviewed the CT study from 07/01/2021 with results as noted below. ?Methods to reduce the risk of UTIs discussed with the patient including increased fluid intake, timed and double voiding, daily cranberry supplement, daily probiotic, and vaginal hormone replacement.Marland Kitchen ?Urine culture sent today. ?Begin cefdinir 300 mg twice daily x7 days. ?Begin vaginal hormone replacement with  ?I discussed further evaluation of the left renal mass with a MRI. She would like to proceed with evaluation. ?MRI of kidneys with and without contrast ?Return to office in 1 month ? ? ?Chief Complaint:  ?Chief Complaint  ?Patient presents with  ? Recurrent UTI  ? ? ?History of Present Illness: ? ?Jasmine Wheeler is a 76 y.o. year old female who is seen in consultation from Dorothyann Peng, NP for evaluation of frequent UTIs.  She reports 5 UTIs since the beginning of 2023.  No recurrent UTIs prior to this.  Typical symptoms include frequency, urgency, and dysuria.  Her symptoms typically resolve with antibiotic therapy.  She was last treated with 5 days of Keflex completing this approximately 3 days ago.  She does report some symptoms of dysuria, frequency, and urgency today.  She had an episode of gross hematuria associated with a UTI in January 2023.  No fevers or chills associated with UTIs.  She did have right flank pain associated with a UTI in March 2023. ? ?Urine culture results: ?1/23 >100 K E. Coli ?3/23 no culture obtained ?4/23 20 K E. coli ?4/23 10-49 K E. Coli ? ?CT abdomen and pelvis without contrast from 07/01/2021 showed no hydronephrosis, punctate nonobstructing left lower pole renal calculus, bilateral renal cysts, and a 2.4 x 1.9 nodular soft tissue lesion in the medial upper pole left kidney. ?Renal ultrasound from 08/19/2021 showed multiple renal  cysts bilaterally, no hydronephrosis, and no definite sonographic evidence for a renal mass in the left kidney. ? ?No problems with constipation.  She does have occasional stress incontinence. ?She is not currently sexually active.  No history of breast or uterine cancer. ? ?Past Medical History:  ?Past Medical History:  ?Diagnosis Date  ? Abnormality of aortic valve   ? enlarged   ? Adenomatous colon polyp   ? Allergy   ? Arthritis   ? back   ? Asthma   ? Benign gastric polyp - hyperplastic 09/2017  ? Cancer [facility])   ? Melanoma left arm   ? Cataract   ? forming  ? Chronic kidney disease   ? CKD III  ? Chronic low back pain   ? Diverticulosis 01/23/2010  ? left colon  ? GERD (gastroesophageal reflux disease)   ? Heart murmur 2016  ? Hyperlipidemia   ? controlled with Repatha  ? Hypertension   ? Hypothyroidism   ? IBS (irritable bowel syndrome)   ? Osteopenia   ? Spinal stenosis   ? SVT (supraventricular tachycardia) ([place])   ? ? ?Past Surgical History:  ?Past Surgical History:  ?Procedure Laterality Date  ? ABDOMINAL HYSTERECTOMY    ? COLON SURGERY  2011  ? hemicolectomy for a TA polyp  ? COLONOSCOPY    ? COLONOSCOPY W/ BIOPSIES  multiple  ? COSMETIC SURGERY    ? from   [place]   ? HEMICOLECTOMY  02/15/2010  ? right, tubulovillous adenoma appendix, Dr. Georgette Dover  ?  HEMORRHOID BANDING  2014  ? LUMBAR EPIDURAL INJECTION  2011  ? x 2  ? MELANOMA EXCISION Left 2012  ? left arm, not sure about lymph nodes  ? POLYPECTOMY    ? REPEAT CESAREAN SECTION    ? 3 in all, last 1973  ? SHOULDER SURGERY Bilateral   ? x2  ? ? ?Allergies:  ?Allergies  ?Allergen Reactions  ? Dicyclomine Nausea Only and Other (See Comments)  ?  Nausea, tingling in her whole body, couldn't sleep, made lights bright  ? Nifedipine Other (See Comments)  ?  Dropped blood pressure and heart rate too much  ? Amlodipine   ?  Dizzy and feel like almost passing out  ? Nitrofurantoin Nausea And Vomiting  ? Rosuvastatin   ?  MYALGIA  ? Simvastatin   ?  MYALGIA  ?  Theophylline Other (See Comments)  ?  Patient cannot recall reaction  ? ? ?Family History:  ?Family History  ?Problem Relation Age of Onset  ? Colon cancer Mother 30  ? Kidney cancer Mother   ? Lung cancer Father   ? Heart disease Father   ? Cervical cancer Sister   ? Colon cancer Maternal Uncle   ? Colon cancer Maternal Uncle   ? Colon polyps Neg Hx   ? Esophageal cancer Neg Hx   ? Rectal cancer Neg Hx   ? Stomach cancer Neg Hx   ? ? ?Social History:  ?Social History  ? ?Tobacco Use  ? Smoking status: Never  ? Smokeless tobacco: Never  ?Vaping Use  ? Vaping Use: Never used  ?Substance Use Topics  ? Alcohol use: No  ? Drug use: No  ? ? ?Review of symptoms:  ?Constitutional:  Negative for unexplained weight loss, night sweats, fever, chills ?ENT:  Negative for nose bleeds, sinus pain, painful swallowing ?CV:  Negative for chest pain, shortness of breath, exercise intolerance, palpitations, loss of consciousness ?Resp:  Negative for cough, wheezing, shortness of breath ?GI:  Negative for nausea, vomiting, diarrhea, bloody stools ?GU:  Positives noted in HPI; otherwise negative for gross hematuria ?Neuro:  Negative for seizures, poor balance, limb weakness, slurred speech ?Psych:  Negative for lack of energy, depression, anxiety ?Endocrine:  Negative for polydipsia, polyuria, symptoms of hypoglycemia (dizziness, hunger, sweating) ?Hematologic:  Negative for anemia, purpura, petechia, prolonged or excessive bleeding, use of anticoagulants  ?Allergic:  Negative for difficulty breathing or choking as a result of exposure to anything; no shellfish allergy; no allergic response (rash/itch) to materials, foods ? ?Physical exam: ?BP (!) 163/82   Pulse 62   Ht '5\' 2"'$  (1.575 m)   Wt 169 lb (76.7 kg)   BMI 30.91 kg/m?  ?GENERAL APPEARANCE:  Well appearing, well developed, well nourished, NAD ?HEENT: Atraumatic, Normocephalic, oropharynx clear. ?NECK: Supple without lymphadenopathy or thyromegaly. ?LUNGS: Clear to auscultation  bilaterally. ?HEART: Regular Rate and Rhythm without murmurs, gallops, or rubs. ?ABDOMEN: Soft, non-tender, No Masses. ?EXTREMITIES: Moves all extremities well.  Without clubbing, cyanosis, or edema. ?NEUROLOGIC:  Alert and oriented x 3, normal gait, CN II-XII grossly intact.  ?MENTAL STATUS:  Appropriate. ?BACK:  Non-tender to palpation.  No CVAT ?SKIN:  Warm, dry and intact.   ? ?Results: ?U/A: >30 WBCs, 3-10 RBCs, few bacteria, nitrite negative ? ?EVR = 25 mL ? ?

## 2021-08-22 ENCOUNTER — Telehealth: Payer: Self-pay

## 2021-08-22 LAB — URINE CULTURE

## 2021-08-22 NOTE — Telephone Encounter (Signed)
-----   Message from Primus Bravo, MD sent at 08/22/2021  9:00 AM EDT ----- ?Please notify the patient that her urine culture did not show evidence of a significant UTI.  No antibiotics indicated at this time.  If she continues to have symptoms, she could come in for a repeat urinalysis and resolve MDX culture. ?

## 2021-08-22 NOTE — Telephone Encounter (Signed)
Patient called and made aware.

## 2021-09-02 ENCOUNTER — Ambulatory Visit: Payer: Medicare HMO | Admitting: Physician Assistant

## 2021-09-02 VITALS — BP 154/84 | HR 61 | Ht 62.0 in | Wt 169.0 lb

## 2021-09-02 DIAGNOSIS — N3001 Acute cystitis with hematuria: Secondary | ICD-10-CM | POA: Diagnosis not present

## 2021-09-02 DIAGNOSIS — R300 Dysuria: Secondary | ICD-10-CM | POA: Diagnosis not present

## 2021-09-02 DIAGNOSIS — R8271 Bacteriuria: Secondary | ICD-10-CM | POA: Diagnosis not present

## 2021-09-02 DIAGNOSIS — N3081 Other cystitis with hematuria: Secondary | ICD-10-CM | POA: Diagnosis not present

## 2021-09-02 LAB — URINALYSIS, ROUTINE W REFLEX MICROSCOPIC
Bilirubin, UA: NEGATIVE
Glucose, UA: NEGATIVE
Ketones, UA: NEGATIVE
Nitrite, UA: NEGATIVE
Protein,UA: NEGATIVE
Specific Gravity, UA: 1.01 (ref 1.005–1.030)
Urobilinogen, Ur: 0.2 mg/dL (ref 0.2–1.0)
pH, UA: 6 (ref 5.0–7.5)

## 2021-09-02 LAB — MICROSCOPIC EXAMINATION
Renal Epithel, UA: NONE SEEN /hpf
WBC, UA: >30 /hpf — AB (ref 0–5)

## 2021-09-02 MED ORDER — SULFAMETHOXAZOLE-TRIMETHOPRIM 800-160 MG PO TABS
1.0000 | ORAL_TABLET | Freq: Two times a day (BID) | ORAL | 0 refills | Status: DC
Start: 2021-09-02 — End: 2021-09-18

## 2021-09-02 NOTE — Progress Notes (Signed)
? ?Assessment: ?1. Burning with urination ?- Urinalysis, Routine w reflex microscopic ? ?2. Acute cystitis with hematuria ? ?3. Bacteriuria ?  ? ?Plan: ?Will start Bactrim DS for 7 days and MDX cx ordered. Will adjust tx if indicated by result.  She will continue Premarin cream twice weekly and is advised to request generic prescription in the future secondary to the high cost of the namebrand dosing.  Pt has appt for MRI as scheduled from last visit. Keep FU with Dr. Felipa Eth as scheduled in 2 weeks.  ? ?Chief Complaint: ?UTI sxs ? ?HPI: ?Jasmine Wheeler is a 76 y.o. female who presents for continued evaluation of recurrent UTIs. Pt was last seen on 08/20/2021 and placed on Omnicef for 7 days.Urine culture showed fewer than 10,000 colonies and no pathogen identified.  Patient states she felt great while on the [facility] but 4 days later, she began having increase in frequency, urgency, urinary burning.  No fever, chills, nausea or vomiting.  No flank pain.  Patient denies gross hematuria.  She started Premarin cream, but is concerned that she may not be able to get refills in the future due to the cost of $180. ?>30WBC, 3-10 RBC, few bacteria, nitrite negative ? ?08/20/21 ?Jasmine Wheeler is a 76 y.o. year old female who is seen in consultation from Dorothyann Peng, NP for evaluation of frequent UTIs.  She reports 5 UTIs since the beginning of 2023.  No recurrent UTIs prior to this.  Typical symptoms include frequency, urgency, and dysuria.  Her symptoms typically resolve with antibiotic therapy.  She was last treated with 5 days of Keflex completing this approximately 3 days ago.  She does report some symptoms of dysuria, frequency, and urgency today.  She had an episode of gross hematuria associated with a UTI in January 2023.  No fevers or chills associated with UTIs.  She did have right flank pain associated with a UTI in March 2023. ?  ?Urine culture results: ?1/23     >100 K E. Coli ?3/23     no culture  obtained ?4/23     20 K E. coli ?4/23     10-49 K E. Coli ?  ?CT abdomen and pelvis without contrast from 07/01/2021 showed no hydronephrosis, punctate nonobstructing left lower pole renal calculus, bilateral renal cysts, and a 2.4 x 1.9 nodular soft tissue lesion in the medial upper pole left kidney. ?Renal ultrasound from 08/19/2021 showed multiple renal cysts bilaterally, no hydronephrosis, and no definite sonographic evidence for a renal mass in the left kidney. ?  ?No problems with constipation.  She does have occasional stress incontinence. ?She is not currently sexually active.  No history of breast or uterine cancer. ?  ?I reviewed the patient's chart including lab results and imaging studies.  I reviewed the CT study from 07/01/2021 with results as noted below. ?Methods to reduce the risk of UTIs discussed with the patient including increased fluid intake, timed and double voiding, daily cranberry supplement, daily probiotic, and vaginal hormone replacement.Marland Kitchen ?Urine culture sent today. ?Begin cefdinir 300 mg twice daily x7 days. ?Begin vaginal hormone replacement with  ?I discussed further evaluation of the left renal mass with a MRI. She would like to proceed with evaluation. ?MRI of kidneys with and without contrast ?Return to office in 1 month ? ?Portions of the above documentation were copied from a prior visit for review purposes only. ? ?Allergies: ?Allergies  ?Allergen Reactions  ? Dicyclomine Nausea Only and Other (See Comments)  ?  Nausea, tingling in her whole body, couldn't sleep, made lights bright  ? Nifedipine Other (See Comments)  ?  Dropped blood pressure and heart rate too much  ? Amlodipine   ?  Dizzy and feel like almost passing out  ? Nitrofurantoin Nausea And Vomiting  ? Rosuvastatin   ?  MYALGIA  ? Simvastatin   ?  MYALGIA  ? Theophylline Other (See Comments)  ?  Patient cannot recall reaction  ? ? ?PMH: ?Past Medical History:  ?Diagnosis Date  ? Abnormality of aortic valve   ? enlarged    ? Adenomatous colon polyp   ? Allergy   ? Arthritis   ? back   ? Asthma   ? Benign gastric polyp - hyperplastic 09/2017  ? Cancer [facility])   ? Melanoma left arm   ? Cataract   ? forming  ? Chronic kidney disease   ? CKD III  ? Chronic low back pain   ? Diverticulosis 01/23/2010  ? left colon  ? GERD (gastroesophageal reflux disease)   ? Heart murmur 2016  ? Hyperlipidemia   ? controlled with Repatha  ? Hypertension   ? Hypothyroidism   ? IBS (irritable bowel syndrome)   ? Osteopenia   ? Spinal stenosis   ? SVT (supraventricular tachycardia) ([place])   ? ? ?PSH: ?Past Surgical History:  ?Procedure Laterality Date  ? ABDOMINAL HYSTERECTOMY    ? COLON SURGERY  2011  ? hemicolectomy for a TA polyp  ? COLONOSCOPY    ? COLONOSCOPY W/ BIOPSIES  multiple  ? COSMETIC SURGERY    ? from   [place]   ? HEMICOLECTOMY  02/15/2010  ? right, tubulovillous adenoma appendix, Dr. Georgette Dover  ? HEMORRHOID BANDING  2014  ? LUMBAR EPIDURAL INJECTION  2011  ? x 2  ? MELANOMA EXCISION Left 2012  ? left arm, not sure about lymph nodes  ? POLYPECTOMY    ? REPEAT CESAREAN SECTION    ? 3 in all, last 1973  ? SHOULDER SURGERY Bilateral   ? x2  ? ? ?SH: ?Social History  ? ?Tobacco Use  ? Smoking status: Never  ? Smokeless tobacco: Never  ?Vaping Use  ? Vaping Use: Never used  ?Substance Use Topics  ? Alcohol use: No  ? Drug use: No  ? ? ?ROS: ?Constitutional:  Negative for fever, chills, weight loss ?CV: Negative for chest pain ?Respiratory:  Negative for shortness of breath, wheezing, sleep apnea, frequent cough ?GI:  Negative for nausea, vomiting, bloody stool, GERD ? ?PE: ?BP (!) 154/84   Pulse 61   Ht '5\' 2"'$  (1.575 m)   Wt 169 lb (76.7 kg)   BMI 30.91 kg/m?  ?GENERAL APPEARANCE:  Well appearing, well developed, well nourished, NAD ?HEENT:  Atraumatic, normocephalic ?NECK:  Supple. Trachea midline ?ABDOMEN:  Soft, mild suprapubic tenderness, no masses ?EXTREMITIES:  Moves all extremities well, without clubbing, cyanosis, or edema ?NEUROLOGIC:  Alert and  oriented x 3, normal gait, CN II-XII grossly intact ?MENTAL STATUS:  appropriate ?BACK:  Non-tender to palpation, No CVAT ?SKIN:  Warm, dry, and intact ? ? ?Results: ?Laboratory Data: ?Lab Results  ?Component Value Date  ? WBC 7.7 07/01/2021  ? HGB 9.9 (L) 07/01/2021  ? HCT 30.2 (L) 07/01/2021  ? MCV 92.9 07/01/2021  ? PLT 260 07/01/2021  ? ? ?Lab Results  ?Component Value Date  ? CREATININE 1.77 (H) 07/01/2021  ? ? ?Lab Results  ?Component Value Date  ? HGBA1C 5.7 06/07/2021  ? ? ?  Urinalysis ?   ?Component Value Date/Time  ? [place] 07/01/2021 2106  ? APPEARANCEUR Cloudy (A) 08/20/2021 1430  ? LABSPEC 1.010 07/01/2021 2106  ? PHURINE 5.0 07/01/2021 2106  ? GLUCOSEU Negative 08/20/2021 1430  ? HGBUR MODERATE (A) 07/01/2021 2106  ? HGBUR negative 08/09/2009 0759  ? BILIRUBINUR Negative 08/20/2021 1430  ? KETONESUR negative 07/20/2021 1140  ? [place] NEGATIVE 07/01/2021 2106  ? PROTEINUR 2+ (A) 08/20/2021 1430  ? PROTEINUR 100 (A) 07/01/2021 2106  ? UROBILINOGEN negative (A) 07/31/2021 1521  ? UROBILINOGEN 1.0 04/26/2010 2340  ? NITRITE Negative 08/20/2021 1430  ? NITRITE NEGATIVE 07/01/2021 2106  ? LEUKOCYTESUR 3+ (A) 08/20/2021 1430  ? LEUKOCYTESUR LARGE (A) 07/01/2021 2106  ? ? ?Lab Results  ?Component Value Date  ? LABMICR See below: 08/20/2021  ? WBCUA >30 (A) 08/20/2021  ? LABEPIT 0-10 08/20/2021  ? BACTERIA Few (A) 08/20/2021  ? ? ?Pertinent Imaging: ? ?No results found for this or any previous visit. ? ?No results found for this or any previous visit. ? ?No results found for this or any previous visit. ? ?No results found for this or any previous visit. ? ?Results for orders placed during the hospital encounter of 08/19/21 ? ?US RENAL ? ?Narrative ?CLINICAL DATA:  Chronic renal disease. Additionally, patient had ?noncontrast CT July 01, 2021 which demonstrated an indeterminate ?soft tissue mass within the upper pole of the left kidney. ? ?EXAM: ?RENAL / URINARY TRACT ULTRASOUND  COMPLETE ? ?COMPARISON:  CT abdomen pelvis July 01, 2021 ? ?FINDINGS: ?Right Kidney: ? ?Renal measurements: 11.1 x 6.2 x 6.1 cm = volume: 216.5 mL. ?Echogenicity within normal limits. No mass or hydronephrosis ?visualized. [place]

## 2021-09-05 ENCOUNTER — Other Ambulatory Visit: Payer: Self-pay | Admitting: Physician Assistant

## 2021-09-06 ENCOUNTER — Other Ambulatory Visit (HOSPITAL_COMMUNITY): Payer: Self-pay | Admitting: *Deleted

## 2021-09-09 ENCOUNTER — Encounter (HOSPITAL_COMMUNITY)
Admission: RE | Admit: 2021-09-09 | Discharge: 2021-09-09 | Disposition: A | Discharge status: Home / Self Care | Payer: Medicare HMO | Source: Ambulatory Visit | Attending: Nephrology | Admitting: Nephrology

## 2021-09-09 DIAGNOSIS — D631 Anemia in chronic kidney disease: Secondary | ICD-10-CM | POA: Diagnosis not present

## 2021-09-09 DIAGNOSIS — N189 Chronic kidney disease, unspecified: Secondary | ICD-10-CM | POA: Diagnosis not present

## 2021-09-09 MED ORDER — SODIUM CHLORIDE 0.9 % IV SOLN
510.0000 mg | INTRAVENOUS | Status: DC
  Administered 2021-09-09: 510 mg via INTRAVENOUS
  Filled 2021-09-09: qty 510

## 2021-09-10 ENCOUNTER — Other Ambulatory Visit: Payer: Self-pay | Admitting: Obstetrics & Gynecology

## 2021-09-10 DIAGNOSIS — Z1231 Encounter for screening mammogram for malignant neoplasm of breast: Secondary | ICD-10-CM

## 2021-09-12 ENCOUNTER — Ambulatory Visit (HOSPITAL_COMMUNITY)
Admission: RE | Admit: 2021-09-12 | Discharge: 2021-09-12 | Disposition: A | Discharge status: Home / Self Care | Payer: Medicare HMO | Source: Ambulatory Visit | Attending: Urology | Admitting: Urology

## 2021-09-12 ENCOUNTER — Other Ambulatory Visit: Payer: Self-pay | Admitting: Urology

## 2021-09-12 DIAGNOSIS — N281 Cyst of kidney, acquired: Secondary | ICD-10-CM | POA: Diagnosis not present

## 2021-09-12 DIAGNOSIS — N2889 Other specified disorders of kidney and ureter: Secondary | ICD-10-CM

## 2021-09-12 DIAGNOSIS — K5730 Diverticulosis of large intestine without perforation or abscess without bleeding: Secondary | ICD-10-CM | POA: Diagnosis not present

## 2021-09-12 DIAGNOSIS — N261 Atrophy of kidney (terminal): Secondary | ICD-10-CM | POA: Diagnosis not present

## 2021-09-17 ENCOUNTER — Encounter (HOSPITAL_COMMUNITY)
Admission: RE | Admit: 2021-09-17 | Discharge: 2021-09-17 | Disposition: A | Discharge status: Home / Self Care | Payer: Medicare HMO | Source: Ambulatory Visit | Attending: Nephrology | Admitting: Nephrology

## 2021-09-17 DIAGNOSIS — D631 Anemia in chronic kidney disease: Secondary | ICD-10-CM | POA: Diagnosis not present

## 2021-09-17 DIAGNOSIS — N189 Chronic kidney disease, unspecified: Secondary | ICD-10-CM | POA: Diagnosis not present

## 2021-09-17 MED ORDER — SODIUM CHLORIDE 0.9 % IV SOLN
510.0000 mg | INTRAVENOUS | Status: DC
  Administered 2021-09-17: 510 mg via INTRAVENOUS
  Filled 2021-09-17: qty 510

## 2021-09-18 ENCOUNTER — Ambulatory Visit: Payer: Medicare HMO | Admitting: Physician Assistant

## 2021-09-18 VITALS — BP 165/83 | HR 62

## 2021-09-18 DIAGNOSIS — N3081 Other cystitis with hematuria: Secondary | ICD-10-CM | POA: Diagnosis not present

## 2021-09-18 DIAGNOSIS — N2889 Other specified disorders of kidney and ureter: Secondary | ICD-10-CM | POA: Diagnosis not present

## 2021-09-18 DIAGNOSIS — R300 Dysuria: Secondary | ICD-10-CM | POA: Diagnosis not present

## 2021-09-18 DIAGNOSIS — N3001 Acute cystitis with hematuria: Secondary | ICD-10-CM | POA: Diagnosis not present

## 2021-09-18 DIAGNOSIS — R3916 Straining to void: Secondary | ICD-10-CM | POA: Diagnosis not present

## 2021-09-18 LAB — URINALYSIS, ROUTINE W REFLEX MICROSCOPIC
Bilirubin, UA: NEGATIVE
Glucose, UA: NEGATIVE
Ketones, UA: NEGATIVE
Nitrite, UA: NEGATIVE
Specific Gravity, UA: <1.005 — ABNORMAL LOW (ref 1.005–1.030)
Urobilinogen, Ur: 0.2 mg/dL (ref 0.2–1.0)
pH, UA: 5.5 (ref 5.0–7.5)

## 2021-09-18 LAB — MICROSCOPIC EXAMINATION
Renal Epithel, UA: NONE SEEN /hpf
WBC, UA: >30 /hpf — AB (ref 0–5)

## 2021-09-18 MED ORDER — SULFAMETHOXAZOLE-TRIMETHOPRIM 800-160 MG PO TABS
1.0000 | ORAL_TABLET | Freq: Two times a day (BID) | ORAL | 0 refills | Status: DC
Start: 2021-09-18 — End: 2021-10-29
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
## 2021-09-19 ENCOUNTER — Encounter: Payer: Self-pay | Admitting: Cardiovascular Disease

## 2021-09-19 ENCOUNTER — Ambulatory Visit: Payer: Medicare HMO | Admitting: Urology

## 2021-09-19 ENCOUNTER — Encounter: Payer: Self-pay | Admitting: Urology

## 2021-09-19 VITALS — BP 119/73 | HR 80 | Ht 62.0 in | Wt 169.0 lb

## 2021-09-19 DIAGNOSIS — N281 Cyst of kidney, acquired: Secondary | ICD-10-CM | POA: Diagnosis not present

## 2021-09-19 DIAGNOSIS — N390 Urinary tract infection, site not specified: Secondary | ICD-10-CM

## 2021-09-19 MED ORDER — QUINAPRIL HCL 20 MG PO TABS: 20.0000 mg | ORAL_TABLET | Freq: Every day | ORAL | 1 refills | Status: DC

## 2021-09-19 MED ORDER — SPIRONOLACTONE 25 MG PO TABS: 12.5000 mg | ORAL_TABLET | Freq: Every day | ORAL | 1 refills | Status: DC

## 2021-09-19 MED ORDER — CEPHALEXIN 500 MG PO CAPS: 500.0000 mg | ORAL_CAPSULE | Freq: Every day | ORAL | 2 refills | Status: DC

## 2021-09-19 NOTE — Addendum Note (Signed)
Addended by: Ricci Barker on: 09/19/2021 03:43 PM   Modules accepted: Orders

## 2021-09-19 NOTE — Progress Notes (Unsigned)
MDX urine culture sent Tracking number [id number]  [id number]
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
## 2021-09-19 NOTE — Telephone Encounter (Signed)
Called the patient per her MyChart request. She stated that her blood pressures have been running lower for the last few weeks. She checks it twice daily, morning and evening. Last night it was 109/77 and this morning, before medications, it was 92/63 with a heart rate of 61. She stated that these numbers have been her norm now. She was lighted headed this morning. She was advised to eat breakfast and rest.   Current medications: Spironolactone 25 mg daily Metoprolol Succinate 100 mg bid Triamterene-HCTZ 37/5-25 half a tablet daily Quinapril 40 once daily  A side note, she has been having blood transfusions due to anemia. She had a second one this week. She is also seeing a urologist and nephrologist due to kidney concerns. She is currently on an antibiotic for a UTI.

## 2021-09-19 NOTE — Telephone Encounter (Signed)
Please reduce the spironolactone to 12.5 mg daily and reduce the quinapril to 20 mg daily. If after 10 days BP is still running low, would stop spironolactone altogether.

## 2021-09-20 ENCOUNTER — Encounter: Payer: Self-pay | Admitting: Cardiovascular Disease

## 2021-09-20 NOTE — Telephone Encounter (Signed)
Patient aware and verbalized understanding.  She will continue to monitor her blood pressures and call back Monday with readings/update.

## 2021-09-20 NOTE — Telephone Encounter (Signed)
Called patient per mychart message:  Reports speaking to RN yesterday in regards to low BP and medication changes were made.    She reports BP being low last night, did not take her metoprolol.  Reports BP still low this AM prior to medication.    BP this AM 98/68 and 94/65 HR 65, 67.  AM Meds: Metoprolol 100 Spironolactone 12.5 Enalapril 20 Triamterene-hctz 37.5-25 (1/2 tablet)  She is unsure what to do with her medications.   Advised to hold currently and will review with MD and pharmD.

## 2021-09-20 NOTE — Telephone Encounter (Signed)
It is important not to stop the metoprolol abruptly.  Please have her skip the spironolactone, enalapril and triamterene hydrochlorothiazide today, but take half of her usual dose of metoprolol.

## 2021-09-23 ENCOUNTER — Encounter: Payer: Self-pay | Admitting: Cardiovascular Disease

## 2021-09-23 ENCOUNTER — Other Ambulatory Visit: Payer: Self-pay | Admitting: Physician Assistant

## 2021-09-23 ENCOUNTER — Encounter: Payer: Self-pay | Admitting: *Deleted

## 2021-09-23 ENCOUNTER — Telehealth: Payer: Self-pay

## 2021-09-23 NOTE — Telephone Encounter (Signed)
Please continue taking the full dose of metoprolol daily. Continue taking the reduced dose of quinapril 20 mg daily, the half dose of spironolactone (12.5 mg daily) and the triamterene HCTZ. If BP drops too low again (SBP<100), plan to permanently stop the spironolactone.

## 2021-09-23 NOTE — Telephone Encounter (Signed)
Left message for the patient to call back but MyChart message has been sent as well.

## 2021-09-23 NOTE — Telephone Encounter (Signed)
Called patient, LVM to call back to discuss.  Thanks!

## 2021-09-23 NOTE — Telephone Encounter (Signed)
BP update:   114/74 HR 59  116/65 HR 60   135/80 HR 69 today   Patient states she had an iron infusion last week and the week before.    She did take the whole dose of Metoprolol today- advised patient to recheck her BP again in about 1-2 hours after medication.   Patient would like to know what she should do going forward.   Thanks!

## 2021-09-24 ENCOUNTER — Telehealth: Payer: Self-pay

## 2021-09-24 NOTE — Telephone Encounter (Signed)
-----   Message from Primus Bravo, MD sent at 09/24/2021  1:10 PM EDT ----- Please notify the patient that her urine culture did show evidence of UTI.  She should complete the Bactrim as prescribed for treatment.  I would then like for her to begin daily cephalexin for UTI prevention.  A prescription was already sent. Keep follow-up as scheduled.

## 2021-09-24 NOTE — Telephone Encounter (Signed)
Sent patient a Pharmacist, community message notifying her.

## 2021-10-09 ENCOUNTER — Encounter: Payer: Self-pay | Admitting: Cardiovascular Disease

## 2021-10-09 NOTE — Telephone Encounter (Signed)
BP looks excellent - no changes.  Dr. Debara Pickett (covering for Dr. Loletha Grayer)

## 2021-10-23 ENCOUNTER — Other Ambulatory Visit: Payer: Self-pay

## 2021-10-23 ENCOUNTER — Encounter: Payer: Self-pay | Admitting: Cardiovascular Disease

## 2021-10-23 NOTE — Telephone Encounter (Signed)
Please increase quinapril to a full tablet first, wait 3-4  days to see what happens before we adjust spironolactone.

## 2021-10-23 NOTE — Telephone Encounter (Signed)
Spoke to patient Dr.Croitoru's advice given.She will increase Quinapril to 20 mg daily.She will monitor B/P and call back in 3 to 4 days if still elevated.

## 2021-10-23 NOTE — Telephone Encounter (Signed)
Spoke to patient she stated since she stopped iron infusions B/P has been going back up.Stated today after meds B/P 168/95,158/90 pulse 54.Stated she wanted to ask Dr.Croitoru if she needs to increase Spironolactone and Quinapril back to 1 tablet instead of a 1/2 tablet.Advised I will send message to Dr.Croitoru for advice.

## 2021-10-28 ENCOUNTER — Encounter: Payer: Self-pay | Admitting: Cardiovascular Disease

## 2021-10-28 ENCOUNTER — Ambulatory Visit
Admission: RE | Admit: 2021-10-28 | Discharge: 2021-10-28 | Disposition: A | Discharge status: Home / Self Care | Payer: Medicare HMO | Source: Ambulatory Visit | Attending: Obstetrics & Gynecology | Admitting: Obstetrics & Gynecology

## 2021-10-28 DIAGNOSIS — Z1231 Encounter for screening mammogram for malignant neoplasm of breast: Secondary | ICD-10-CM

## 2021-10-28 NOTE — Telephone Encounter (Signed)
Spoke with pt regarding her blood pressure that has been slow increasing over the last few months. Pt states that blood pressure medication was lowered while she was receiving infusions. Pt dropped off a blood pressure log (primary RN has this) today. She does mention that she is aware of salt restriction in her diet. Pt states that her blood pressure tends to be good in the morning then later in the afternoon and evening it seems to be going up. Will send to provider to advise. Pt verbalizes understanding.

## 2021-10-29 ENCOUNTER — Encounter: Payer: Self-pay | Admitting: Urology

## 2021-10-29 ENCOUNTER — Ambulatory Visit: Payer: Medicare HMO | Admitting: Urology

## 2021-10-29 VITALS — BP 162/74 | HR 59

## 2021-10-29 DIAGNOSIS — N281 Cyst of kidney, acquired: Secondary | ICD-10-CM | POA: Diagnosis not present

## 2021-10-29 DIAGNOSIS — Z87440 Personal history of urinary (tract) infections: Secondary | ICD-10-CM | POA: Diagnosis not present

## 2021-10-29 DIAGNOSIS — N390 Urinary tract infection, site not specified: Secondary | ICD-10-CM | POA: Diagnosis not present

## 2021-10-29 LAB — URINALYSIS, ROUTINE W REFLEX MICROSCOPIC
Bilirubin, UA: NEGATIVE
Glucose, UA: NEGATIVE
Ketones, UA: NEGATIVE
Leukocytes,UA: NEGATIVE
Nitrite, UA: NEGATIVE
Protein,UA: NEGATIVE
RBC, UA: NEGATIVE
Specific Gravity, UA: 1.01 (ref 1.005–1.030)
Urobilinogen, Ur: 0.2 mg/dL (ref 0.2–1.0)
pH, UA: 6.5 (ref 5.0–7.5)

## 2021-10-29 NOTE — Addendum Note (Signed)
Addended by: Darcella Gasman R on: 10/29/2021 11:45 AM   Modules accepted: Orders

## 2021-10-29 NOTE — Addendum Note (Signed)
Addended by: Darcella Gasman R on: 10/29/2021 11:22 AM   Modules accepted: Orders
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
## 2021-10-29 NOTE — Addendum Note (Signed)
Addended by: Darcella Gasman R on: 10/29/2021 11:14 AM   Modules accepted: Orders

## 2021-11-04 DIAGNOSIS — N1832 Chronic kidney disease, stage 3b: Secondary | ICD-10-CM | POA: Diagnosis not present

## 2021-11-04 DIAGNOSIS — N289 Disorder of kidney and ureter, unspecified: Secondary | ICD-10-CM | POA: Diagnosis not present

## 2021-11-04 DIAGNOSIS — D631 Anemia in chronic kidney disease: Secondary | ICD-10-CM | POA: Diagnosis not present

## 2021-11-04 DIAGNOSIS — I129 Hypertensive chronic kidney disease with stage 1 through stage 4 chronic kidney disease, or unspecified chronic kidney disease: Secondary | ICD-10-CM | POA: Diagnosis not present

## 2021-11-04 DIAGNOSIS — N189 Chronic kidney disease, unspecified: Secondary | ICD-10-CM | POA: Diagnosis not present

## 2021-11-06 DIAGNOSIS — D1801 Hemangioma of skin and subcutaneous tissue: Secondary | ICD-10-CM | POA: Diagnosis not present

## 2021-11-06 DIAGNOSIS — D2261 Melanocytic nevi of right upper limb, including shoulder: Secondary | ICD-10-CM | POA: Diagnosis not present

## 2021-11-06 DIAGNOSIS — L821 Other seborrheic keratosis: Secondary | ICD-10-CM | POA: Diagnosis not present

## 2021-11-06 DIAGNOSIS — D2272 Melanocytic nevi of left lower limb, including hip: Secondary | ICD-10-CM | POA: Diagnosis not present

## 2021-11-06 DIAGNOSIS — Z85820 Personal history of malignant melanoma of skin: Secondary | ICD-10-CM | POA: Diagnosis not present

## 2021-11-06 DIAGNOSIS — D225 Melanocytic nevi of trunk: Secondary | ICD-10-CM | POA: Diagnosis not present

## 2021-11-06 DIAGNOSIS — L814 Other melanin hyperpigmentation: Secondary | ICD-10-CM | POA: Diagnosis not present

## 2021-11-06 DIAGNOSIS — D2262 Melanocytic nevi of left upper limb, including shoulder: Secondary | ICD-10-CM | POA: Diagnosis not present

## 2021-12-05 ENCOUNTER — Encounter: Payer: Self-pay | Admitting: Cardiovascular Disease

## 2021-12-05 DIAGNOSIS — I471 Supraventricular tachycardia: Secondary | ICD-10-CM

## 2021-12-05 DIAGNOSIS — R002 Palpitations: Secondary | ICD-10-CM

## 2021-12-05 NOTE — Telephone Encounter (Signed)
Spoke to patient .  RN informed patient of Dr Sallyanne Kuster recommendation to wear an event  monitor for 14 days. Patient verbalized understanding and prefer to come to the office to have it placed.    Patient is aware  she will get an update once monitor is completed and reviewed what to expect next.

## 2021-12-05 NOTE — Telephone Encounter (Signed)
In the past she had a few episodes of very brief nonsustained atrial tachycardia on the monitor that she wore in 2020, but never something sustained.  Please order a 14-day event monitor.  I would not recommend increasing her metoprolol since she tends to have heart rates in the 50s on the current dose.  If we do not catch anything on the event monitor, which is very possible, recommend getting a commercially available electronic rhythm monitor such as a smart watch (expensive) or a Kardia device (cheaper, needs to pair to smart phone or tablet).

## 2021-12-05 NOTE — Telephone Encounter (Signed)
Spoke to patient . She states she had an episode yesterday evening -  heart rate went up  to  152  lasting  about 30 - 40 minutes -  she states  she did  sit down  and tried to relax for about 10 min  before  alerting her husband  and daughter.  They did call EMS to evaluate.   By the time EMS arrived her heart  rate was coming down and she did not go to the ER .   EMS recommend her to contact office today to see what she may need to do .   Patient did nit given any vital signs other than her heart rate of 152.  She states she did have a little  difficulty with breathing duringthis period and somewhat anxious but no other symptoms.    Cardiac Medication are Metoprolol succinate 100 mg twice a day  Quniapril 40 mg at bedtime Spironolactone 12.5 mg daily Triamterene-hctz 37.5/25  ( 1/2 tablet) daily    Patient is not experiencing anything at present    Aware will defer to Dr Olevia Perches for further instructions

## 2021-12-07 ENCOUNTER — Other Ambulatory Visit: Payer: Self-pay | Admitting: Adult Health

## 2021-12-11 ENCOUNTER — Other Ambulatory Visit (INDEPENDENT_AMBULATORY_CARE_PROVIDER_SITE_OTHER): Payer: Medicare HMO

## 2021-12-11 DIAGNOSIS — R002 Palpitations: Secondary | ICD-10-CM | POA: Diagnosis not present

## 2021-12-11 DIAGNOSIS — I471 Supraventricular tachycardia: Secondary | ICD-10-CM

## 2021-12-11 NOTE — Progress Notes (Unsigned)
ZIO XT [id number] from office inventory applied to patient using tincture of benzoin as skin barrier to adhesive.  Dr. Sallyanne Kuster to read.

## 2021-12-13 ENCOUNTER — Ambulatory Visit (INDEPENDENT_AMBULATORY_CARE_PROVIDER_SITE_OTHER): Payer: Medicare HMO

## 2021-12-13 VITALS — Ht 62.0 in | Wt 169.0 lb

## 2021-12-13 DIAGNOSIS — Z0000 Encounter for general adult medical examination without abnormal findings: Secondary | ICD-10-CM
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
## 2021-12-13 NOTE — Patient Instructions (Addendum)
Jasmine Wheeler , Thank you for taking time to come for your Medicare Wellness Visit. I appreciate your ongoing commitment to your health goals. Please review the following plan we discussed and let me know if I can assist you in the future.   Screening recommendations/referrals: Colonoscopy: No longer required Mammogram: No longer required Bone Density: Up to date Recommended yearly ophthalmology/optometry visit for glaucoma screening and checkup Recommended yearly dental visit for hygiene and checkup  Vaccinations: Influenza vaccine: Done Up to date Pneumococcal vaccine: Done up to date Tdap vaccine: Up to date Shingles vaccine: Done up todate   Covid-19:Done Up to date  Advanced directives: Advance directive discussed with you today. Even though you declined this today, please call our office should you change your mind, and we can give you the proper paperwork for you to fill out.   Conditions/risks identified: None  Next appointment: Follow up in one year for your annual wellness visit     Preventive Care 65 Years and Older, Female Preventive care refers to lifestyle choices and visits with your health care provider that can promote health and wellness. What does preventive care include? A yearly physical exam. This is also called an annual well check. Dental exams once or twice a year. Routine eye exams. Ask your health care provider how often you should have your eyes checked. Personal lifestyle choices, including: Daily care of your teeth and gums. Regular physical activity. Eating a healthy diet. Avoiding tobacco and drug use. Limiting alcohol use. Practicing safe sex. Taking low-dose aspirin every day. Taking vitamin and mineral supplements as recommended by your health care provider. What happens during an annual well check? The services and screenings done by your health care provider during your annual well check will depend on your age, overall health, lifestyle  risk factors, and family history of disease. Counseling  Your health care provider may ask you questions about your: Alcohol use. Tobacco use. Drug use. Emotional well-being. Home and relationship well-being. Sexual activity. Eating habits. History of falls. Memory and ability to understand (cognition). Work and work Statistician. Reproductive health. Screening  You may have the following tests or measurements: Height, weight, and BMI. Blood pressure. Lipid and cholesterol levels. These may be checked every 5 years, or more frequently if you are over 96 years old. Skin check. Lung cancer screening. You may have this screening every year starting at age 52 if you have a 30-pack-year history of smoking and currently smoke or have quit within the past 15 years. Fecal occult blood test (FOBT) of the stool. You may have this test every year starting at age 61. Flexible sigmoidoscopy or colonoscopy. You may have a sigmoidoscopy every 5 years or a colonoscopy every 10 years starting at age 23. Hepatitis C blood test. Hepatitis B blood test. Sexually transmitted disease (STD) testing. Diabetes screening. This is done by checking your blood sugar (glucose) after you have not eaten for a while (fasting). You may have this done every 1-3 years. Bone density scan. This is done to screen for osteoporosis. You may have this done starting at age 60. Mammogram. This may be done every 1-2 years. Talk to your health care provider about how often you should have regular mammograms. Talk with your health care provider about your test results, treatment options, and if necessary, the need for more tests. Vaccines  Your health care provider may recommend certain vaccines, such as: Influenza vaccine. This is recommended every year. Tetanus, diphtheria, and acellular pertussis (Tdap, Td)  vaccine. You may need a Td booster every 10 years. Zoster vaccine. You may need this after age 72. Pneumococcal  13-valent conjugate (PCV13) vaccine. One dose is recommended after age 37. Pneumococcal polysaccharide (PPSV23) vaccine. One dose is recommended after age 10. Talk to your health care provider about which screenings and vaccines you need and how often you need them. This information is not intended to replace advice given to you by your health care provider. Make sure you discuss any questions you have with your health care provider. Document Released: 05/04/2015 Document Revised: 12/26/2015 Document Reviewed: 02/06/2015 Elsevier Interactive Patient Education  2017 [place] Prevention in the Home Falls can cause injuries. They can happen to people of all ages. There are many things you can do to make your home safe and to help prevent falls. What can I do on the outside of my home? Regularly fix the edges of walkways and driveways and fix any cracks. Remove anything that might make you trip as you walk through a door, such as a raised step or threshold. Trim any bushes or trees on the path to your home. Use bright outdoor lighting. Clear any walking paths of anything that might make someone trip, such as rocks or tools. Regularly check to see if handrails are loose or broken. Make sure that both sides of any steps have handrails. Any raised decks and porches should have guardrails on the edges. Have any leaves, snow, or ice cleared regularly. Use sand or salt on walking paths during winter. Clean up any spills in your garage right away. This includes oil or grease spills. What can I do in the bathroom? Use night lights. Install grab bars by the toilet and in the tub and shower. Do not use towel bars as grab bars. Use non-skid mats or decals in the tub or shower. If you need to sit down in the shower, use a plastic, non-slip stool. Keep the floor dry. Clean up any water that spills on the floor as soon as it happens. Remove soap buildup in the tub or shower regularly. Attach  bath mats securely with double-sided non-slip rug tape. Do not have throw rugs and other things on the floor that can make you trip. What can I do in the bedroom? Use night lights. Make sure that you have a light by your bed that is easy to reach. Do not use any sheets or blankets that are too big for your bed. They should not hang down onto the floor. Have a firm chair that has side arms. You can use this for support while you get dressed. Do not have throw rugs and other things on the floor that can make you trip. What can I do in the kitchen? Clean up any spills right away. Avoid walking on wet floors. Keep items that you use a lot in easy-to-reach places. If you need to reach something above you, use a strong step stool that has a grab bar. Keep electrical cords out of the way. Do not use floor polish or wax that makes floors slippery. If you must use wax, use non-skid floor wax. Do not have throw rugs and other things on the floor that can make you trip. What can I do with my stairs? Do not leave any items on the stairs. Make sure that there are handrails on both sides of the stairs and use them. Fix handrails that are broken or loose. Make sure that handrails are as long  as the stairways. Check any carpeting to make sure that it is firmly attached to the stairs. Fix any carpet that is loose or worn. Avoid having throw rugs at the top or bottom of the stairs. If you do have throw rugs, attach them to the floor with carpet tape. Make sure that you have a light switch at the top of the stairs and the bottom of the stairs. If you do not have them, ask someone to add them for you. What else can I do to help prevent falls? Wear shoes that: Do not have high heels. Have rubber bottoms. Are comfortable and fit you well. Are closed at the toe. Do not wear sandals. If you use a stepladder: Make sure that it is fully opened. Do not climb a closed stepladder. Make sure that both sides of the  stepladder are locked into place. Ask someone to hold it for you, if possible. Clearly mark and make sure that you can see: Any grab bars or handrails. First and last steps. Where the edge of each step is. Use tools that help you move around (mobility aids) if they are needed. These include: Canes. Walkers. Scooters. Crutches. Turn on the lights when you go into a dark area. Replace any light bulbs as soon as they burn out. Set up your furniture so you have a clear path. Avoid moving your furniture around. If any of your floors are uneven, fix them. If there are any pets around you, be aware of where they are. Review your medicines with your doctor. Some medicines can make you feel dizzy. This can increase your chance of falling. Ask your doctor what other things that you can do to help prevent falls. This information is not intended to replace advice given to you by your health care provider. Make sure you discuss any questions you have with your health care provider. Document Released: 02/01/2009 Document Revised: 09/13/2015 Document Reviewed: 05/12/2014 Elsevier Interactive Patient Education  2017 [employer].

## 2021-12-17 ENCOUNTER — Telehealth: Payer: Self-pay

## 2021-12-17 DIAGNOSIS — N390 Urinary tract infection, site not specified: Secondary | ICD-10-CM

## 2021-12-17 MED ORDER — CEPHALEXIN 500 MG PO CAPS: 500.0000 mg | ORAL_CAPSULE | Freq: Every day | ORAL | 0 refills | Status: DC

## 2021-12-17 NOTE — Telephone Encounter (Signed)
Patient called advising that at her last visit she was advised that if she needed we could send in another refill for medication. Patient advised she will finish out this refill this weekend.    Medication: cephALEXin (KEFLEX) 500 MG capsule   Pharmacy: [facility] [place] - [phone] [place]

## 2021-12-17 NOTE — Telephone Encounter (Signed)
Message left for patient - rx sent to pharmacy for 1 refill

## 2021-12-27 ENCOUNTER — Ambulatory Visit (INDEPENDENT_AMBULATORY_CARE_PROVIDER_SITE_OTHER): Payer: Medicare HMO

## 2021-12-27 ENCOUNTER — Ambulatory Visit: Payer: Medicare HMO

## 2021-12-27 DIAGNOSIS — Z23 Encounter for immunization: Secondary | ICD-10-CM

## 2022-01-01 DIAGNOSIS — I4729 Other ventricular tachycardia: Secondary | ICD-10-CM | POA: Diagnosis not present

## 2022-01-02 ENCOUNTER — Ambulatory Visit (INDEPENDENT_AMBULATORY_CARE_PROVIDER_SITE_OTHER): Payer: Medicare HMO | Admitting: Urology

## 2022-01-02 ENCOUNTER — Encounter: Payer: Self-pay | Admitting: Urology

## 2022-01-02 VITALS — BP 174/93 | HR 69 | Ht 62.5 in | Wt 169.0 lb

## 2022-01-02 DIAGNOSIS — N390 Urinary tract infection, site not specified: Secondary | ICD-10-CM | POA: Diagnosis not present

## 2022-01-02 DIAGNOSIS — N281 Cyst of kidney, acquired: Secondary | ICD-10-CM

## 2022-01-02 DIAGNOSIS — Z87440 Personal history of urinary (tract) infections: Secondary | ICD-10-CM | POA: Diagnosis not present

## 2022-01-02 LAB — URINALYSIS, ROUTINE W REFLEX MICROSCOPIC
Bilirubin, UA: NEGATIVE
Glucose, UA: NEGATIVE
Ketones, UA: NEGATIVE
Leukocytes,UA: NEGATIVE
Nitrite, UA: NEGATIVE
Protein,UA: NEGATIVE
RBC, UA: NEGATIVE
Specific Gravity, UA: <1.005 — ABNORMAL LOW (ref 1.005–1.030)
Urobilinogen, Ur: 0.2 mg/dL (ref 0.2–1.0)
pH, UA: 6 (ref 5.0–7.5)

## 2022-01-02 MED ORDER — CEPHALEXIN 500 MG PO CAPS: 500.0000 mg | ORAL_CAPSULE | Freq: Every day | ORAL | 2 refills | Status: DC
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
## 2022-01-09 ENCOUNTER — Encounter: Payer: Self-pay | Admitting: Cardiovascular Disease

## 2022-01-09 MED ORDER — LISINOPRIL 40 MG PO TABS: 40.0000 mg | ORAL_TABLET | Freq: Every day | ORAL | 3 refills | Status: DC

## 2022-01-09 NOTE — Telephone Encounter (Signed)
Please switch from quinapril to lisinopril 40 mg once daily

## 2022-01-15 DIAGNOSIS — H2511 Age-related nuclear cataract, right eye: Secondary | ICD-10-CM | POA: Diagnosis not present

## 2022-01-19 HISTORY — PX: CATARACT EXTRACTION, BILATERAL: SHX1313

## 2022-01-24 DIAGNOSIS — H2511 Age-related nuclear cataract, right eye: Secondary | ICD-10-CM | POA: Diagnosis not present

## 2022-01-24 DIAGNOSIS — H269 Unspecified cataract: Secondary | ICD-10-CM | POA: Diagnosis not present

## 2022-02-05 ENCOUNTER — Other Ambulatory Visit: Payer: Self-pay | Admitting: Cardiovascular Disease

## 2022-02-07 DIAGNOSIS — H2512 Age-related nuclear cataract, left eye: Secondary | ICD-10-CM | POA: Diagnosis not present

## 2022-02-07 DIAGNOSIS — H269 Unspecified cataract: Secondary | ICD-10-CM | POA: Diagnosis not present

## 2022-02-17 ENCOUNTER — Other Ambulatory Visit: Payer: Self-pay | Admitting: Cardiovascular Disease

## 2022-02-19 ENCOUNTER — Other Ambulatory Visit: Payer: Self-pay | Admitting: Cardiovascular Disease

## 2022-03-03 DIAGNOSIS — D631 Anemia in chronic kidney disease: Secondary | ICD-10-CM | POA: Diagnosis not present

## 2022-03-03 DIAGNOSIS — N189 Chronic kidney disease, unspecified: Secondary | ICD-10-CM | POA: Diagnosis not present

## 2022-03-03 DIAGNOSIS — I129 Hypertensive chronic kidney disease with stage 1 through stage 4 chronic kidney disease, or unspecified chronic kidney disease: Secondary | ICD-10-CM | POA: Diagnosis not present

## 2022-03-03 DIAGNOSIS — N289 Disorder of kidney and ureter, unspecified: Secondary | ICD-10-CM | POA: Diagnosis not present

## 2022-03-03 DIAGNOSIS — N1832 Chronic kidney disease, stage 3b: Secondary | ICD-10-CM | POA: Diagnosis not present

## 2022-03-21 ENCOUNTER — Encounter: Payer: Self-pay | Admitting: Cardiovascular Disease

## 2022-03-21 ENCOUNTER — Ambulatory Visit: Payer: Medicare HMO | Attending: Cardiovascular Disease | Admitting: Cardiovascular Disease

## 2022-03-21 ENCOUNTER — Other Ambulatory Visit: Payer: Self-pay

## 2022-03-21 VITALS — BP 128/70 | HR 59 | Ht 62.5 in | Wt 171.6 lb

## 2022-03-21 DIAGNOSIS — E039 Hypothyroidism, unspecified: Secondary | ICD-10-CM | POA: Diagnosis not present

## 2022-03-21 DIAGNOSIS — I7121 Aneurysm of the ascending aorta, without rupture: Secondary | ICD-10-CM

## 2022-03-21 DIAGNOSIS — I10 Essential (primary) hypertension: Secondary | ICD-10-CM | POA: Diagnosis not present

## 2022-03-21 DIAGNOSIS — I471 Supraventricular tachycardia, unspecified: Secondary | ICD-10-CM

## 2022-03-21 DIAGNOSIS — E7800 Pure hypercholesterolemia, unspecified: Secondary | ICD-10-CM | POA: Diagnosis not present

## 2022-03-21 DIAGNOSIS — I700 Atherosclerosis of aorta: Secondary | ICD-10-CM

## 2022-03-21 DIAGNOSIS — I453 Trifascicular block: Secondary | ICD-10-CM | POA: Diagnosis not present

## 2022-03-21 DIAGNOSIS — I351 Nonrheumatic aortic (valve) insufficiency: Secondary | ICD-10-CM

## 2022-03-21 DIAGNOSIS — N1831 Chronic kidney disease, stage 3a: Secondary | ICD-10-CM | POA: Diagnosis not present

## 2022-03-21 MED ORDER — METOPROLOL TARTRATE 25 MG PO TABS: ORAL_TABLET | ORAL | 1 refills | Status: DC

## 2022-03-21 MED ORDER — SPIRONOLACTONE-HCTZ 25-25 MG PO TABS: 0.5000 | ORAL_TABLET | Freq: Every day | ORAL | 0 refills | Status: DC

## 2022-03-21 MED ORDER — SPIRONOLACTONE-HCTZ 25-25 MG PO TABS: 0.5000 | ORAL_TABLET | Freq: Every day | ORAL | 3 refills | Status: DC

## 2022-03-21 NOTE — Patient Instructions (Addendum)
Medication Instructions:  STOP Spironolactone  STOP Maxzide  START  Spironolactone-HCTZ 25 mg-25 mg half tablet daily  TAKE Metoprolol Tartrate 25 mg as needed for palpitations  *If you need a refill on your cardiac medications before your next appointment, please call your pharmacy*  Lab Work: NONE ordered at this time of appointment   If you have labs (blood work) drawn today and your tests are completely normal, you will receive your results only by: [place] (if you have MyChart) OR A paper copy in the mail If you have any lab test that is abnormal or we need to change your treatment, we will call you to review the results.  Testing/Procedures: Your physician has requested that you have an echocardiogram. Echocardiography is a painless test that uses sound waves to create images of your heart. It provides your doctor with information about the size and shape of your heart and how well your heart's chambers and valves are working. This procedure takes approximately one hour. There are no restrictions for this procedure. Please do NOT wear cologne, perfume, aftershave, or lotions (deodorant is allowed). Please arrive 15 minutes prior to your appointment time.  Please schedule prior to December 2024 follow up appointment  Follow-Up: At [facility], you and your health needs are our priority.  As part of our continuing mission to provide you with exceptional heart care, we have created designated Provider Care Teams.  These Care Teams include your primary Cardiologist (physician) and Advanced Practice Providers (APPs -  Physician Assistants and Nurse Practitioners) who all work together to provide you with the care you need, when you need it.  Your next appointment:   1 year(s)  The format for your next appointment:   In Person  Provider:   Sanda Klein, MD     Other Instructions You have been referred to Cardiac Electrophysiology  Important Information About  [place]
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
## 2022-03-22 ENCOUNTER — Encounter: Payer: Self-pay | Admitting: Cardiovascular Disease

## 2022-03-29 ENCOUNTER — Ambulatory Visit
Admission: EM | Admit: 2022-03-29 | Discharge: 2022-03-29 | Disposition: A | Discharge status: Home / Self Care | Payer: Medicare HMO | Attending: Nurse Practitioner | Admitting: Nurse Practitioner

## 2022-03-29 DIAGNOSIS — Z1152 Encounter for screening for COVID-19: Secondary | ICD-10-CM | POA: Diagnosis not present

## 2022-03-29 DIAGNOSIS — J069 Acute upper respiratory infection, unspecified: Secondary | ICD-10-CM

## 2022-03-29 DIAGNOSIS — J101 Influenza due to other identified influenza virus with other respiratory manifestations: Secondary | ICD-10-CM | POA: Insufficient documentation

## 2022-03-29 LAB — RESP PANEL BY RT-PCR (FLU A&B, COVID) ARPGX2
Influenza A by PCR: POSITIVE — AB
Influenza B by PCR: NEGATIVE
SARS Coronavirus 2 by RT PCR: NEGATIVE

## 2022-03-29 MED ORDER — BENZONATATE 100 MG PO CAPS: 100.0000 mg | ORAL_CAPSULE | Freq: Three times a day (TID) | ORAL | 0 refills | Status: DC | PRN

## 2022-03-29 NOTE — ED Triage Notes (Signed)
Pt reports cough and nasal congestion x 2 days. Reports cough is so intense makes her nauseous. Tylenol and Robitussin give some relief.   Pt taking Keflex (low dose) for UTI started since May 2023.

## 2022-03-29 NOTE — Discharge Instructions (Signed)
COVID/flu test is pending. As discussed, you will be contacted if the results are positive. You are a candidate to receive molnupiravir if your COVID test is positive.  Take medication as prescribed. Increase fluids and allow for plenty of rest. Continue Tylenol as needed for pain, fever, or general discomfort. Warm salt water gargles 3-4 times daily to help with throat pain or discomfort. As discussed, if your symptoms do not improve by 09/27/2021, pick up your prescription for the antibiotic at your preferred pharmacy. Recommend using a humidifier at bedtime during sleep to help with cough and nasal congestion. Sleep elevated on 2 pillows while cough persists. Continue for monitoring of symptoms. If symptoms suddenly worsen or do not improve, please follow-up with your primary care physician or in this clinic.
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
## 2022-04-02 ENCOUNTER — Ambulatory Visit (HOSPITAL_COMMUNITY)
Admission: RE | Admit: 2022-04-02 | Discharge: 2022-04-02 | Disposition: A | Discharge status: Home / Self Care | Payer: Medicare HMO | Source: Ambulatory Visit | Attending: Cardiovascular Disease | Admitting: Cardiovascular Disease

## 2022-04-02 DIAGNOSIS — I351 Nonrheumatic aortic (valve) insufficiency: Secondary | ICD-10-CM | POA: Diagnosis not present

## 2022-04-02 DIAGNOSIS — N281 Cyst of kidney, acquired: Secondary | ICD-10-CM | POA: Diagnosis not present

## 2022-04-02 LAB — ECHOCARDIOGRAM COMPLETE
AR max vel: 2.07 cm2
AV Area VTI: 2.46 cm2
AV Area mean vel: 2.35 cm2
AV Mean grad: 5 mmHg
AV Peak grad: 12.4 mmHg
Ao pk vel: 1.76 m/s
Area-P 1/2: 1.81 cm2
P 1/2 time: 637 msec
S' Lateral: 3.4 cm

## 2022-04-02 NOTE — Progress Notes (Signed)
*  PRELIMINARY RESULTS* Echocardiogram 2D Echocardiogram has been performed.  Jasmine Wheeler 04/02/2022, 2:22 PM

## 2022-04-03 ENCOUNTER — Ambulatory Visit (HOSPITAL_COMMUNITY)
Admission: RE | Admit: 2022-04-03 | Discharge: 2022-04-03 | Disposition: A | Discharge status: Home / Self Care | Payer: Medicare HMO | Source: Ambulatory Visit | Attending: Urology | Admitting: Urology

## 2022-04-03 ENCOUNTER — Ambulatory Visit: Payer: Medicare HMO | Admitting: Urology

## 2022-04-03 DIAGNOSIS — K5730 Diverticulosis of large intestine without perforation or abscess without bleeding: Secondary | ICD-10-CM | POA: Diagnosis not present

## 2022-04-03 DIAGNOSIS — I351 Nonrheumatic aortic (valve) insufficiency: Secondary | ICD-10-CM | POA: Diagnosis not present

## 2022-04-03 DIAGNOSIS — I700 Atherosclerosis of aorta: Secondary | ICD-10-CM | POA: Diagnosis not present

## 2022-04-03 DIAGNOSIS — N281 Cyst of kidney, acquired: Secondary | ICD-10-CM

## 2022-04-03 MED ORDER — GADOBUTROL 1 MMOL/ML IV SOLN
7.5000 mL | Freq: Once | INTRAVENOUS | Status: AC | PRN
  Administered 2022-04-03: 7.5 mL via INTRAVENOUS

## 2022-04-08 ENCOUNTER — Encounter: Payer: Self-pay | Admitting: Urology

## 2022-04-08 ENCOUNTER — Ambulatory Visit: Payer: Medicare HMO | Admitting: Urology

## 2022-04-08 VITALS — BP 162/88 | HR 61 | Ht 62.5 in | Wt 166.0 lb

## 2022-04-08 DIAGNOSIS — N390 Urinary tract infection, site not specified: Secondary | ICD-10-CM | POA: Diagnosis not present

## 2022-04-08 DIAGNOSIS — N281 Cyst of kidney, acquired: Secondary | ICD-10-CM | POA: Diagnosis not present

## 2022-04-08 MED ORDER — CEPHALEXIN 250 MG PO CAPS: 250.0000 mg | ORAL_CAPSULE | Freq: Every day | ORAL | 11 refills | Status: DC

## 2022-04-08 MED ORDER — ESTRADIOL 0.1 MG/GM VA CREA: 1.0000 | TOPICAL_CREAM | VAGINAL | 12 refills | Status: DC
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
## 2022-04-08 NOTE — Patient Instructions (Signed)
Urinary Tract Infection, Adult  A urinary tract infection (UTI) is an infection of any part of the urinary tract. The urinary tract includes the kidneys, ureters, bladder, and urethra. These organs make, store, and get rid of urine in the body. An upper UTI affects the ureters and kidneys. A lower UTI affects the bladder and urethra. What are the causes? Most urinary tract infections are caused by bacteria in your genital area around your urethra, where urine leaves your body. These bacteria grow and cause inflammation of your urinary tract. What increases the risk? You are more likely to develop this condition if: You have a urinary catheter that stays in place. You are not able to control when you urinate or have a bowel movement (incontinence). You are female and you: Use a spermicide or diaphragm for birth control. Have low estrogen levels. Are pregnant. You have certain genes that increase your risk. You are sexually active. You take antibiotic medicines. You have a condition that causes your flow of urine to slow down, such as: An enlarged prostate, if you are female. Blockage in your urethra. A kidney stone. A nerve condition that affects your bladder control (neurogenic bladder). Not getting enough to drink, or not urinating often. You have certain medical conditions, such as: Diabetes. A weak disease-fighting system (immunesystem). Sickle cell disease. Gout. Spinal cord injury. What are the signs or symptoms? Symptoms of this condition include: Needing to urinate right away (urgency). Frequent urination. This may include small amounts of urine each time you urinate. Pain or burning with urination. Blood in the urine. Urine that smells bad or unusual. Trouble urinating. Cloudy urine. Vaginal discharge, if you are female. Pain in the abdomen or the lower back. You may also have: Vomiting or a decreased appetite. Confusion. Irritability or tiredness. A fever or  chills. Diarrhea. The first symptom in older adults may be confusion. In some cases, they may not have any symptoms until the infection has worsened. How is this diagnosed? This condition is diagnosed based on your medical history and a physical exam. You may also have other tests, including: Urine tests. Blood tests. Tests for STIs (sexually transmitted infections). If you have had more than one UTI, a cystoscopy or imaging studies may be done to determine the cause of the infections. How is this treated? Treatment for this condition includes: Antibiotic medicine. Over-the-counter medicines to treat discomfort. Drinking enough water to stay hydrated. If you have frequent infections or have other conditions such as a kidney stone, you may need to see a health care provider who specializes in the urinary tract (urologist). In rare cases, urinary tract infections can cause sepsis. Sepsis is a life-threatening condition that occurs when the body responds to an infection. Sepsis is treated in the hospital with IV antibiotics, fluids, and other medicines. Follow these instructions at home:  Medicines Take over-the-counter and prescription medicines only as told by your health care provider. If you were prescribed an antibiotic medicine, take it as told by your health care provider. Do not stop using the antibiotic even if you start to feel better. General instructions Make sure you: Empty your bladder often and completely. Do not hold urine for long periods of time. Empty your bladder after sex. Wipe from front to back after urinating or having a bowel movement if you are female. Use each tissue only one time when you wipe. Drink enough fluid to keep your urine pale yellow. Keep all follow-up visits. This is important. Contact a health   care provider if: Your symptoms do not get better after 1-2 days. Your symptoms go away and then return. Get help right away if: You have severe pain in  your back or your lower abdomen. You have a fever or chills. You have nausea or vomiting. Summary A urinary tract infection (UTI) is an infection of any part of the urinary tract, which includes the kidneys, ureters, bladder, and urethra. Most urinary tract infections are caused by bacteria in your genital area. Treatment for this condition often includes antibiotic medicines. If you were prescribed an antibiotic medicine, take it as told by your health care provider. Do not stop using the antibiotic even if you start to feel better. Keep all follow-up visits. This is important. This information is not intended to replace advice given to you by your health care provider. Make sure you discuss any questions you have with your health care provider. Document Revised: 11/18/2019 Document Reviewed: 11/18/2019 Elsevier Patient Education  2023 [employer]  

## 2022-04-09 LAB — URINALYSIS, ROUTINE W REFLEX MICROSCOPIC
Bilirubin, UA: NEGATIVE
Glucose, UA: NEGATIVE
Ketones, UA: NEGATIVE
Leukocytes,UA: NEGATIVE
Nitrite, UA: NEGATIVE
Protein,UA: NEGATIVE
RBC, UA: NEGATIVE
Specific Gravity, UA: 1.015 (ref 1.005–1.030)
Urobilinogen, Ur: 0.2 mg/dL (ref 0.2–1.0)
pH, UA: 5 (ref 5.0–7.5)

## 2022-04-17 ENCOUNTER — Other Ambulatory Visit: Payer: Self-pay | Admitting: Adult Health

## 2022-04-17 ENCOUNTER — Other Ambulatory Visit: Payer: Self-pay | Admitting: Cardiovascular Disease

## 2022-04-22 ENCOUNTER — Ambulatory Visit: Payer: Medicare HMO | Attending: Internal Medicine | Admitting: Internal Medicine

## 2022-04-22 ENCOUNTER — Encounter: Payer: Self-pay | Admitting: Internal Medicine

## 2022-04-22 VITALS — BP 146/84 | HR 66 | Ht 62.5 in | Wt 170.0 lb

## 2022-04-22 DIAGNOSIS — I471 Supraventricular tachycardia, unspecified: Secondary | ICD-10-CM | POA: Diagnosis not present
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
## 2022-04-22 NOTE — Patient Instructions (Signed)
Medication Instructions:  Your physician recommends that you continue on your current medications as directed. Please refer to the Current Medication list given to you today.  *If you need a refill on your cardiac medications before your next appointment, please call your pharmacy*  Follow-Up: At [facility], you and your health needs are our priority.  As part of our continuing mission to provide you with exceptional heart care, we have created designated Provider Care Teams.  These Care Teams include your primary Cardiologist (physician) and Advanced Practice Providers (APPs -  Physician Assistants and Nurse Practitioners) who all work together to provide you with the care you need, when you need it.  Your next appointment:   As needed  Important Information About Sugar

## 2022-05-08 ENCOUNTER — Other Ambulatory Visit (HOSPITAL_COMMUNITY): Payer: Self-pay

## 2022-05-12 ENCOUNTER — Encounter: Payer: Self-pay | Admitting: Cardiovascular Disease

## 2022-05-13 ENCOUNTER — Encounter: Payer: Self-pay | Admitting: Cardiovascular Disease

## 2022-05-13 ENCOUNTER — Other Ambulatory Visit: Payer: Self-pay | Admitting: Pharmacist

## 2022-05-13 MED ORDER — REPATHA SURECLICK 140 MG/ML ~~LOC~~ SOAJ: 140.0000 mg | SUBCUTANEOUS | 3 refills | Status: DC

## 2022-05-21 ENCOUNTER — Encounter: Payer: Self-pay | Admitting: Adult Health

## 2022-05-22 ENCOUNTER — Encounter: Payer: Self-pay | Admitting: Adult Health

## 2022-05-28 NOTE — Telephone Encounter (Signed)
Spoke to pt and she stated that she wanted to get the RSV vaccine. Pt was told by insurance company that it will have to be approved by provider. I advised pt that she can go to the pharmacy to get it. Pt verbalized understanding.

## 2022-05-30 ENCOUNTER — Encounter: Payer: Self-pay | Admitting: Cardiovascular Disease

## 2022-05-30 NOTE — Telephone Encounter (Signed)
Pt stated her BP has been elevated lately.  Yesterday 176/96, P 55; 164/97, P 56 This AM when first OOB, 185/94, P 60 The only symptom she reported is anxiety. Lat month, her dog dies in her arms. "I feel anxiety inside." She has not changed eating habits. She is taking meds as prescribed.

## 2022-05-30 NOTE — Telephone Encounter (Signed)
Suspect that the emotions and anxiety have a lot to do with it. If needed, OK to take the extra metoprolol tartrate that she has been prescribed for palpitations"as needed". That will help with the emotion related BP elevation as well, since it works by blocking the effects of adrenaline on  the cardiovascular system. Please take all other chronic BP meds as prescribed as well.

## 2022-05-30 NOTE — Telephone Encounter (Signed)
Spoke with pt and gave her reply from Dr. Sallyanne Kuster: "Suspect that the emotions and anxiety have a lot to do with it. If needed, OK to take the extra metoprolol tartrate that she has been prescribed for palpitations "as needed". That will help with the emotion related BP elevation as well, since it works by blocking the effects of adrenaline on the cardiovascular system. Please take all other chronic BP meds as prescribed as well."  Pt verbalized understanding. She stated she doesn't really feel palpitations, but gets anxiety in her chest. Recommended she take met tart the next time to see if it helps; and not more than once in a day.

## 2022-05-31 ENCOUNTER — Telehealth: Payer: Self-pay | Admitting: Cardiology

## 2022-05-31 NOTE — Telephone Encounter (Signed)
Patient called the answering service this afternoon concerned that her BP has been high for the past few days. Per chart review, patient called the office yesterday with similar to concerns, and Dr. Sallyanne Kuster suspected elevated BP was in part due to anxiety. She was instructed to take an extra tablet of metoprolol tartrate if needed.   In discussion with the patient today, she reports that her BP has been elevated for the past 3 days. Today, BP was 178/98. Patient reports feeling overall well, but she did have a headache earlier this AM. Headache resolved with tylenol. Patient has taken her regular BP medications today, but has not taken an extra tablet of metoprolol tartrate. She does report having some anxiety recently, and feels like that could be contributing to her BP elevation. I instructed her to take an extra tablet of metoprolol tartrate today and to try to relax over the weekend. If her BP remains elevated, she should call the office on Monday for an appointment. Patient voiced understanding and agreement. Also instructed patient that if she develops vision changes, weakness, significant dizziness, she should go to the ED for evaluation. Patient reports that she is feeling well right now and is not having these symptoms.   Margie Billet, PA-C 05/31/2022 12:14 PM

## 2022-06-02 ENCOUNTER — Other Ambulatory Visit: Payer: Self-pay | Admitting: Internal Medicine

## 2022-06-02 ENCOUNTER — Other Ambulatory Visit: Payer: Self-pay | Admitting: Cardiovascular Disease

## 2022-06-11 ENCOUNTER — Other Ambulatory Visit: Payer: Self-pay | Admitting: Cardiovascular Disease

## 2022-06-18 ENCOUNTER — Encounter: Payer: Self-pay | Admitting: Cardiovascular Disease

## 2022-07-03 ENCOUNTER — Ambulatory Visit (INDEPENDENT_AMBULATORY_CARE_PROVIDER_SITE_OTHER): Payer: Medicare HMO | Admitting: Adult Health

## 2022-07-03 ENCOUNTER — Encounter: Payer: Self-pay | Admitting: Adult Health

## 2022-07-03 VITALS — BP 136/88 | HR 85 | Temp 98.0°F | Ht 62.5 in | Wt 173.0 lb

## 2022-07-03 DIAGNOSIS — R7302 Impaired glucose tolerance (oral): Secondary | ICD-10-CM

## 2022-07-03 DIAGNOSIS — E039 Hypothyroidism, unspecified: Secondary | ICD-10-CM

## 2022-07-03 DIAGNOSIS — Z0000 Encounter for general adult medical examination without abnormal findings: Secondary | ICD-10-CM

## 2022-07-03 DIAGNOSIS — J4520 Mild intermittent asthma, uncomplicated: Secondary | ICD-10-CM

## 2022-07-03 DIAGNOSIS — E782 Mixed hyperlipidemia: Secondary | ICD-10-CM | POA: Diagnosis not present

## 2022-07-03 DIAGNOSIS — N183 Chronic kidney disease, stage 3 unspecified: Secondary | ICD-10-CM

## 2022-07-03 DIAGNOSIS — I10 Essential (primary) hypertension: Secondary | ICD-10-CM

## 2022-07-03 LAB — TSH: TSH: 3.48 u[IU]/mL (ref 0.35–5.50)

## 2022-07-03 LAB — COMPREHENSIVE METABOLIC PANEL
ALT: 11 U/L (ref 0–35)
AST: 15 U/L (ref 0–37)
Albumin: 4 g/dL (ref 3.5–5.2)
Alkaline Phosphatase: 72 U/L (ref 39–117)
BUN: 28 mg/dL — ABNORMAL HIGH (ref 6–23)
CO2: 33 mEq/L — ABNORMAL HIGH (ref 19–32)
Calcium: 9.3 mg/dL (ref 8.4–10.5)
Chloride: 99 mEq/L (ref 96–112)
Creatinine, Ser: 1.38 mg/dL — ABNORMAL HIGH (ref 0.40–1.20)
GFR: 36.5 mL/min — ABNORMAL LOW (ref >60.00)
Glucose, Bld: 98 mg/dL (ref 70–99)
Potassium: 3.6 mEq/L (ref 3.5–5.1)
Sodium: 140 mEq/L (ref 135–145)
Total Bilirubin: 0.4 mg/dL (ref 0.2–1.2)
Total Protein: 6.7 g/dL (ref 6.0–8.3)

## 2022-07-03 LAB — CBC WITH DIFFERENTIAL/PLATELET
Basophils Absolute: 0 10*3/uL (ref 0.0–0.1)
Basophils Relative: 0.1 % (ref 0.0–3.0)
Eosinophils Absolute: 0.4 10*3/uL (ref 0.0–0.7)
Eosinophils Relative: 8.8 % — ABNORMAL HIGH (ref 0.0–5.0)
HCT: 33.3 % — ABNORMAL LOW (ref 36.0–46.0)
Hemoglobin: 11.2 g/dL — ABNORMAL LOW (ref 12.0–15.0)
Lymphocytes Relative: 20.7 % (ref 12.0–46.0)
Lymphs Abs: 1 10*3/uL (ref 0.7–4.0)
MCHC: 33.7 g/dL (ref 30.0–36.0)
MCV: 90.2 fl (ref 78.0–100.0)
Monocytes Absolute: 0.4 10*3/uL (ref 0.1–1.0)
Monocytes Relative: 8 % (ref 3.0–12.0)
Neutro Abs: 3 10*3/uL (ref 1.4–7.7)
Neutrophils Relative %: 62.4 % (ref 43.0–77.0)
Platelets: 225 10*3/uL (ref 150.0–400.0)
RBC: 3.69 Mil/uL — ABNORMAL LOW (ref 3.87–5.11)
RDW: 12.9 % (ref 11.5–15.5)
WBC: 4.8 10*3/uL (ref 4.0–10.5)

## 2022-07-03 LAB — LIPID PANEL
Cholesterol: 104 mg/dL (ref 0–200)
HDL: 46 mg/dL (ref >39.00)
LDL Cholesterol: 32 mg/dL (ref 0–99)
NonHDL: 57.72
Total CHOL/HDL Ratio: 2
Triglycerides: 128 mg/dL (ref 0.0–149.0)
VLDL: 25.6 mg/dL (ref 0.0–40.0)

## 2022-07-03 LAB — HEMOGLOBIN A1C: Hgb A1c MFr Bld: 5.7 % (ref 4.6–6.5)

## 2022-07-03 NOTE — Patient Instructions (Signed)
It was great seeing you today   We will follow up with you regarding your lab work   Please let me know if you need anything   

## 2022-07-03 NOTE — Progress Notes (Signed)
Subjective:    Patient ID: Jasmine Wheeler, female    DOB: 02-04-1946, 77 y.o.   MRN: [id number]  HPI Patient presents for yearly preventative medicine examination. She is a pleasant 77 year old female who  has a past medical history of Abnormality of aortic valve, Adenomatous colon polyp, Allergy, Arthritis, Asthma, Benign gastric polyp - hyperplastic (09/2017), Cancer ([place]), Cataract, Chronic kidney disease, Chronic low back pain, Diverticulosis (01/23/2010), GERD (gastroesophageal reflux disease), Heart murmur (2016), Hyperlipidemia, Hypertension, Hypothyroidism, IBS (irritable bowel syndrome), Osteopenia, Spinal stenosis, and SVT (supraventricular tachycardia).  Hypertension/SVT-is managed by cardiology.  Currently prescribed lisinopril 40 mg,  spironolactone 25 mg, Maxide 37.5-'25mg'$   and metoprolol 100 mg XR.  She does monitor her blood pressures at home with readings mostly in the 130-140/80's.  She denies dizziness, lightheadedness, chest pain, shortness of breath. She will have feeling of tachycardia from time time. Notices this when she is being too active  BP Readings from Last 3 Encounters:  07/03/22 (!) 160/100  04/22/22 (!) 146/84  04/08/22 (!) 162/88   Hypothyroidism-takes synthroid 100 mcg daily.  Feels well controlled  GERD-is controlled with Protonix 40 mg daily  Hyperlipidemia-currently prescribed Repatha 140 mg every two weeks.   This is managed by cardiology.  In the past she has been on statin due to myalgia. Lab Results  Component Value Date   CHOL 130 06/07/2021   HDL 45.10 06/07/2021   LDLCALC 58 06/07/2021   TRIG 135.0 06/07/2021   CHOLHDL 3 06/07/2021   Chronic Kidney disease  Stage 3 B- is seen by Nephrology on a routine basis. Has no complaints.   Asthma - well controlled. Will use Albuterol inhaler PRN.   Glucose Intolerance - not currently on medication.  Lab Results  Component Value Date   HGBA1C 5.7 06/07/2021    All immunizations and health  maintenance protocols were reviewed with the patient and needed orders were placed. UTD on vaccinations   Appropriate screening laboratory values were ordered for the patient including screening of hyperlipidemia, renal function and hepatic function.  Medication reconciliation,  past medical history, social history, problem list and allergies were reviewed in detail with the patient  Goals were established with regard to weight loss, exercise, and  diet in compliance with medications Wt Readings from Last 3 Encounters:  07/03/22 173 lb (78.5 kg)  04/22/22 170 lb (77.1 kg)  04/08/22 166 lb (75.3 kg)    Review of Systems  Constitutional: Negative.   HENT: Negative.    Eyes: Negative.   Respiratory: Negative.    Cardiovascular: Negative.   Gastrointestinal: Negative.   Endocrine: Negative.   Genitourinary: Negative.   Musculoskeletal: Negative.   Skin: Negative.   Allergic/Immunologic: Negative.   Neurological: Negative.   Hematological: Negative.   Psychiatric/Behavioral: Negative.     Past Medical History:  Diagnosis Date   Abnormality of aortic valve    enlarged    Adenomatous colon polyp    Allergy    Arthritis    back    Asthma    Benign gastric polyp - hyperplastic 09/2017   Cancer ([place])    Melanoma left arm    Cataract    forming   Chronic kidney disease    CKD III   Chronic low back pain    Diverticulosis 01/23/2010   left colon   GERD (gastroesophageal reflux disease)    Heart murmur 2016   Hyperlipidemia    controlled with Repatha   Hypertension    Hypothyroidism  IBS (irritable bowel syndrome)    Osteopenia    Spinal stenosis    SVT (supraventricular tachycardia)     Social History   Socioeconomic History   Marital status: Married    Spouse name: Not on file   Number of children: 3   Years of education: Not on file   Highest education level: 12th grade  Occupational History   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MGR    Employer: [employer]  Tobacco Use    Smoking status: Never   Smokeless tobacco: Never  Vaping Use   Vaping Use: Never used  Substance and Sexual Activity   Alcohol use: No   Drug use: No   Sexual activity: Not on file  Other Topics Concern   Not on file  Social History Narrative   Married retired lives in [place]   No alcohol tobacco or drug use   Caffeine is 2 cups of coffee daily   3 children   Elderly mother lives with her and requires some care    Social Determinants of Health   Financial Resource Strain: Low Risk  (12/13/2021)   Overall Financial Resource Strain (CARDIA)    Difficulty of Paying Living Expenses: Not hard at all  Food Insecurity: No Food Insecurity (12/13/2021)   Hunger Vital Sign    Worried About Running Out of Food in the Last Year: Never true    [place] in the Last Year: Never true  Transportation Needs: No Transportation Needs (12/13/2021)   PRAPARE - Hydrologist (Medical): No    Lack of Transportation (Non-Medical): No  Physical Activity: Inactive (12/13/2021)   Exercise Vital Sign    Days of Exercise per Week: 0 days    Minutes of Exercise per Session: 0 min  Stress: No Stress Concern Present (12/13/2021)   [place]    Feeling of Stress : Not at all  Social Connections: Moderately Integrated (12/13/2021)   Social Connection and Isolation Panel [NHANES]    Frequency of Communication with Friends and Family: More than three times a week    Frequency of Social Gatherings with Friends and Family: Once a week    Attends Religious Services: More than 4 times per year    Active Member of [employer] or Organizations: No    Attends Archivist Meetings: Never    Marital Status: Married  Human resources officer Violence: Not At Risk (12/12/2020)   Humiliation, Afraid, Rape, and Kick questionnaire    Fear of Current or Ex-Partner: No    Emotionally Abused: No    Physically Abused: No     Sexually Abused: No    Past Surgical History:  Procedure Laterality Date   ABDOMINAL HYSTERECTOMY     COLON SURGERY  2011   hemicolectomy for a TA polyp   COLONOSCOPY     COLONOSCOPY W/ BIOPSIES  multiple   COSMETIC SURGERY     from   [place]  02/15/2010   right, tubulovillous adenoma appendix, Dr. Georgette Dover   HEMORRHOID BANDING  2014   [place]  2011   x 2   MELANOMA EXCISION Left 2012   left arm, not sure about lymph nodes   POLYPECTOMY     REPEAT CESAREAN SECTION     3 in all, last 1973   SHOULDER SURGERY Bilateral    x2    Family History  Problem Relation Age of Onset  Colon cancer Mother 16   Kidney cancer Mother    Lung cancer Father    Heart disease Father    Cervical cancer Sister    Colon cancer Maternal Uncle    Colon cancer Maternal Uncle    Colon polyps Neg Hx    Esophageal cancer Neg Hx    Rectal cancer Neg Hx    Stomach cancer Neg Hx     Allergies  Allergen Reactions   Dicyclomine Nausea Only and Other (See Comments)    Nausea, tingling in her whole body, couldn't sleep, made lights bright   Nifedipine Other (See Comments)    Dropped blood pressure and heart rate too much   Amlodipine     Dizzy and feel like almost passing out   Nitrofurantoin Nausea And Vomiting   Rosuvastatin     MYALGIA   Simvastatin     MYALGIA   Theophylline Other (See Comments)    Patient cannot recall reaction    Current Outpatient Medications on File Prior to Visit  Medication Sig Dispense Refill   albuterol (VENTOLIN HFA) 108 (90 Base) MCG/ACT inhaler INHALE 2 PUFFS EVERY SIX HOURS AS NEEDED FOR WHEEZING OR SHORTNESS OF BREATH 3 each 3   Ascorbic Acid (VITAMIN C) 500 MG tablet daily. Take 2 tablet daily     aspirin 81 MG EC tablet Take 1 tablet (81 mg total) by mouth daily with breakfast. 30 tablet 12   Calcium Carbonate-Vitamin D (CALCIUM-VITAMIN D) 500-200 MG-UNIT per tablet Take 1 tablet by mouth 2 (two) times daily with a meal.      cephALEXin (KEFLEX) 250 MG capsule Take 1 capsule (250 mg total) by mouth at bedtime. 30 capsule 11   cetirizine (ZYRTEC) 10 MG tablet Take 10 mg by mouth daily.     estradiol (ESTRACE) 0.1 MG/GM vaginal cream Place 1 Applicatorful vaginally 3 (three) times a week. 42.5 g 12   Evolocumab (REPATHA SURECLICK) [id number] MG/ML SOAJ Inject 140 mg into the skin every 14 (fourteen) days. 6 mL 3   fluticasone (FLONASE) 50 MCG/ACT nasal spray USE 2 SPRAYS INTO BOTH NOSTRILS DAILY. 48 g 1   lisinopril (ZESTRIL) 40 MG tablet Take 1 tablet (40 mg total) by mouth daily. 90 tablet 3   Melatonin 5 MG CAPS Take by mouth at bedtime.     metoprolol succinate (TOPROL-XL) 100 MG 24 hr tablet TAKE 1 TABLET IN THE MORNING AND AT BEDTIME. MUST KEEP SCHEDULED APPOINTMENT 180 tablet 1   metoprolol tartrate (LOPRESSOR) 25 MG tablet TAKE 1 TABLET AS DIRECTED AS NEEDED FOR PALPITATIONS 15 tablet 1   Multiple Vitamin (MULTIVITAMIN) tablet Take 1 tablet by mouth daily.     Multiple Vitamins-Minerals (VITAMIN D3 COMPLETE PO) Take by mouth.     pantoprazole (PROTONIX) 40 MG tablet TAKE 1 TABLET EVERY DAY BEFORE BREAKFAST 90 tablet 3   Peppermint Oil 90 MG CPCR Take 2 capsules by mouth 2 (two) times daily.     spironolactone-hydrochlorothiazide (ALDACTAZIDE) 25-25 MG tablet Take 0.5 tablets by mouth daily. 30 tablet 0   SYNTHROID 100 MCG tablet TAKE 1 TABLET EVERY MORNING BEFORE BREAKFAST 90 tablet 3   vitamin B-12 (CYANOCOBALAMIN) 500 MCG tablet Take 500 mcg by mouth daily.     No current facility-administered medications on file prior to visit.    BP (!) 160/100   Pulse 85   Temp 98 F (36.7 C) (Oral)   Ht 5' 2.5" (1.588 m)   Wt 173 lb (78.5 kg)   SpO2  94%   BMI 31.14 kg/m       Objective:   Physical Exam Vitals and nursing note reviewed.  Constitutional:      General: She is not in acute distress.    Appearance: Normal appearance. She is well-developed. She is not ill-appearing.  HENT:     Head: Normocephalic and  atraumatic.     Right Ear: Tympanic membrane, ear canal and external ear normal. There is no impacted cerumen.     Left Ear: Tympanic membrane, ear canal and external ear normal. There is no impacted cerumen.     Nose: Nose normal. No congestion or rhinorrhea.     Mouth/Throat:     Mouth: Mucous membranes are moist.     Pharynx: Oropharynx is clear. No oropharyngeal exudate or posterior oropharyngeal erythema.  Eyes:     General:        Right eye: No discharge.        Left eye: No discharge.     Extraocular Movements: Extraocular movements intact.     Conjunctiva/sclera: Conjunctivae normal.     Pupils: Pupils are equal, round, and reactive to light.  Neck:     Thyroid: No thyromegaly.     Vascular: No carotid bruit.     Trachea: No tracheal deviation.  Cardiovascular:     Rate and Rhythm: Normal rate and regular rhythm.     Pulses: Normal pulses.     Heart sounds: Normal heart sounds. No murmur heard.    No friction rub. No gallop.  Pulmonary:     Effort: Pulmonary effort is normal. No respiratory distress.     Breath sounds: Normal breath sounds. No stridor. No wheezing, rhonchi or rales.  Chest:     Chest wall: No tenderness.  Abdominal:     General: Abdomen is flat. Bowel sounds are normal. There is no distension.     Palpations: Abdomen is soft. There is no mass.     Tenderness: There is no abdominal tenderness. There is no right CVA tenderness, left CVA tenderness, guarding or rebound.     Hernia: No hernia is present.  Musculoskeletal:        General: No swelling, tenderness, deformity or signs of injury. Normal range of motion.     Cervical back: Normal range of motion and neck supple.     Right lower leg: No edema.     Left lower leg: No edema.  Lymphadenopathy:     Cervical: No cervical adenopathy.  Skin:    General: Skin is warm and dry.     Coloration: Skin is not jaundiced or pale.     Findings: No bruising, erythema, lesion or rash.  Neurological:      General: No focal deficit present.     Mental Status: She is alert and oriented to person, place, and time.     Cranial Nerves: No cranial nerve deficit.     Sensory: No sensory deficit.     Motor: No weakness.     Coordination: Coordination normal.     Gait: Gait normal.     Deep Tendon Reflexes: Reflexes normal.  Psychiatric:        Mood and Affect: Mood normal.        Behavior: Behavior normal.        Thought Content: Thought content normal.        Judgment: Judgment normal.       Assessment & Plan:  1. Routine general medical examination at a health care  facility Today patient counseled on age appropriate routine health concerns for screening and prevention, each reviewed and up to date or declined. Immunizations reviewed and up to date or declined. Labs ordered and reviewed. Risk factors for depression reviewed and negative. Hearing function and visual acuity are intact. ADLs screened and addressed as needed. Functional ability and level of safety reviewed and appropriate. Education, counseling and referrals performed based on assessed risks today. Patient provided with a copy of personalized plan for preventive services. - Follow up in one year or sooner if needed  2. Essential hypertension - At goal. No change in medication  - CBC with Differential/Platelet; Future - Comprehensive metabolic panel; Future - Lipid panel; Future - TSH; Future  3. Acquired hypothyroidism - Consider dose change in synthroid  - CBC with Differential/Platelet; Future - Comprehensive metabolic panel; Future - Lipid panel; Future - TSH; Future  4. Mixed hyperlipidemia - Continue with Repatha  - CBC with Differential/Platelet; Future - Comprehensive metabolic panel; Future - Lipid panel; Future - TSH; Future  5. Stage 3 chronic kidney disease, unspecified whether stage 3a or 3b CKD ([place]) - Per nephrology  - CBC with Differential/Platelet; Future - Comprehensive metabolic panel; Future -  Lipid panel; Future - TSH; Future  6. Mild intermittent asthma without complication - Use albuterol inhaler PRN  - CBC with Differential/Platelet; Future - Comprehensive metabolic panel; Future - Lipid panel; Future - TSH; Future  7. Impaired glucose tolerance - Consider metformin  - CBC with Differential/Platelet; Future - Comprehensive metabolic panel; Future - Lipid panel; Future - TSH; Future - Hemoglobin A1c; Future  Dorothyann Peng, NP

## 2022-07-07 DIAGNOSIS — D631 Anemia in chronic kidney disease: Secondary | ICD-10-CM | POA: Diagnosis not present

## 2022-07-07 DIAGNOSIS — N1832 Chronic kidney disease, stage 3b: Secondary | ICD-10-CM | POA: Diagnosis not present

## 2022-07-07 DIAGNOSIS — I129 Hypertensive chronic kidney disease with stage 1 through stage 4 chronic kidney disease, or unspecified chronic kidney disease: Secondary | ICD-10-CM | POA: Diagnosis not present

## 2022-07-07 DIAGNOSIS — N289 Disorder of kidney and ureter, unspecified: Secondary | ICD-10-CM | POA: Diagnosis not present

## 2022-07-21 ENCOUNTER — Other Ambulatory Visit: Payer: Self-pay | Admitting: Adult Health

## 2022-08-17 ENCOUNTER — Other Ambulatory Visit: Payer: Self-pay | Admitting: Cardiovascular Disease

## 2022-09-08 ENCOUNTER — Other Ambulatory Visit: Payer: Self-pay | Admitting: Cardiovascular Disease

## 2022-09-16 ENCOUNTER — Other Ambulatory Visit: Payer: Self-pay | Admitting: Obstetrics & Gynecology

## 2022-09-16 DIAGNOSIS — Z0000 Encounter for general adult medical examination without abnormal findings: Secondary | ICD-10-CM

## 2022-09-22 ENCOUNTER — Other Ambulatory Visit: Payer: Self-pay | Admitting: Adult Health

## 2022-10-08 ENCOUNTER — Ambulatory Visit: Payer: Medicare HMO | Admitting: Urology

## 2022-10-08 VITALS — BP 192/83 | HR 60 | Ht 62.5 in | Wt 173.0 lb

## 2022-10-08 DIAGNOSIS — Z09 Encounter for follow-up examination after completed treatment for conditions other than malignant neoplasm: Secondary | ICD-10-CM

## 2022-10-08 DIAGNOSIS — N390 Urinary tract infection, site not specified: Secondary | ICD-10-CM | POA: Diagnosis not present

## 2022-10-08 DIAGNOSIS — Z87440 Personal history of urinary (tract) infections: Secondary | ICD-10-CM | POA: Diagnosis not present

## 2022-10-08 LAB — URINALYSIS, ROUTINE W REFLEX MICROSCOPIC
Bilirubin, UA: NEGATIVE
Glucose, UA: NEGATIVE
Ketones, UA: NEGATIVE
Leukocytes,UA: NEGATIVE
Nitrite, UA: NEGATIVE
Protein,UA: NEGATIVE
RBC, UA: NEGATIVE
Specific Gravity, UA: <1.005 — ABNORMAL LOW (ref 1.005–1.030)
Urobilinogen, Ur: 0.2 mg/dL (ref 0.2–1.0)
pH, UA: 6 (ref 5.0–7.5)

## 2022-10-08 MED ORDER — ESTRADIOL 0.1 MG/GM VA CREA: 1.0000 | TOPICAL_CREAM | VAGINAL | 12 refills | Status: DC

## 2022-10-08 MED ORDER — CEPHALEXIN 250 MG PO CAPS: 250.0000 mg | ORAL_CAPSULE | Freq: Every day | ORAL | 11 refills | Status: DC
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
## 2022-10-09 ENCOUNTER — Encounter: Payer: Self-pay | Admitting: Cardiovascular Disease

## 2022-10-09 NOTE — Telephone Encounter (Signed)
Urologist appt yesterday and BP 192/83 both arms HR 60.  Checked yesterday afternoon once at home.  States 174/85 lowest it was ;ast night.  BP 179/86  HR 54-58 this morning, then at 11 after meds 184/88.  She states iron infusion last year that caused Htn but Dr Royann Shivers gave medication and it leveled out.  Aldactazide 12.5 (1/2 of 25mg  tablet) Daily in the morning Lisinopril 40 mg daily in the evening Metoprolol Tartrate 100 mg One tablet in the morning and one tablet in the evening Metoprolol 25 mg PRN for palpitations.  She has taken one yesterday and again this morning for palpitations. Dizzy this morning when got up with slight headache but both resolved. The only time experienced was this morning. Watches sodium intake well.  Yesterday only had water and decaf coffee when went to appt with Urology.  Please advise

## 2022-10-09 NOTE — Telephone Encounter (Signed)
Did not miss metoprolol? (Missing doses can cause rebound high BP). If persistently elevated BP, take the whole aldactazide tablet daily. May take a few days to fully kick in.

## 2022-10-11 ENCOUNTER — Observation Stay (HOSPITAL_COMMUNITY)
Admission: EM | Admit: 2022-10-11 | Discharge: 2022-10-12 | Disposition: A | Discharge status: Home / Self Care | Payer: Medicare HMO | Attending: Family Medicine | Admitting: Family Medicine

## 2022-10-11 ENCOUNTER — Emergency Department (HOSPITAL_COMMUNITY): Payer: Medicare HMO

## 2022-10-11 ENCOUNTER — Other Ambulatory Visit: Payer: Self-pay

## 2022-10-11 ENCOUNTER — Encounter (HOSPITAL_COMMUNITY): Payer: Self-pay

## 2022-10-11 DIAGNOSIS — Z85820 Personal history of malignant melanoma of skin: Secondary | ICD-10-CM | POA: Insufficient documentation

## 2022-10-11 DIAGNOSIS — I129 Hypertensive chronic kidney disease with stage 1 through stage 4 chronic kidney disease, or unspecified chronic kidney disease: Secondary | ICD-10-CM | POA: Insufficient documentation

## 2022-10-11 DIAGNOSIS — J45909 Unspecified asthma, uncomplicated: Secondary | ICD-10-CM | POA: Insufficient documentation

## 2022-10-11 DIAGNOSIS — R072 Precordial pain: Secondary | ICD-10-CM

## 2022-10-11 DIAGNOSIS — N1832 Chronic kidney disease, stage 3b: Secondary | ICD-10-CM | POA: Diagnosis not present

## 2022-10-11 DIAGNOSIS — R778 Other specified abnormalities of plasma proteins: Secondary | ICD-10-CM | POA: Diagnosis not present

## 2022-10-11 DIAGNOSIS — Z79899 Other long term (current) drug therapy: Secondary | ICD-10-CM | POA: Diagnosis not present

## 2022-10-11 DIAGNOSIS — R7989 Other specified abnormal findings of blood chemistry: Secondary | ICD-10-CM | POA: Diagnosis not present

## 2022-10-11 DIAGNOSIS — E039 Hypothyroidism, unspecified: Secondary | ICD-10-CM | POA: Diagnosis present

## 2022-10-11 DIAGNOSIS — I10 Essential (primary) hypertension: Secondary | ICD-10-CM | POA: Diagnosis present

## 2022-10-11 DIAGNOSIS — R002 Palpitations: Secondary | ICD-10-CM

## 2022-10-11 DIAGNOSIS — I517 Cardiomegaly: Secondary | ICD-10-CM | POA: Diagnosis not present

## 2022-10-11 DIAGNOSIS — E876 Hypokalemia: Secondary | ICD-10-CM | POA: Diagnosis not present

## 2022-10-11 DIAGNOSIS — R000 Tachycardia, unspecified: Secondary | ICD-10-CM | POA: Diagnosis not present

## 2022-10-11 DIAGNOSIS — R079 Chest pain, unspecified: Secondary | ICD-10-CM | POA: Diagnosis present

## 2022-10-11 DIAGNOSIS — R0789 Other chest pain: Secondary | ICD-10-CM | POA: Diagnosis not present

## 2022-10-11 DIAGNOSIS — I700 Atherosclerosis of aorta: Secondary | ICD-10-CM | POA: Diagnosis not present

## 2022-10-11 LAB — COMPREHENSIVE METABOLIC PANEL
ALT: 19 U/L (ref 0–44)
AST: 23 U/L (ref 15–41)
Albumin: 3.8 g/dL (ref 3.5–5.0)
Alkaline Phosphatase: 57 U/L (ref 38–126)
Anion gap: 14 (ref 5–15)
BUN: 26 mg/dL — ABNORMAL HIGH (ref 8–23)
CO2: 30 mmol/L (ref 22–32)
Calcium: 9.6 mg/dL (ref 8.9–10.3)
Chloride: 95 mmol/L — ABNORMAL LOW (ref 98–111)
Creatinine, Ser: 1.57 mg/dL — ABNORMAL HIGH (ref 0.44–1.00)
GFR, Estimated: 33 mL/min — ABNORMAL LOW (ref >60)
Glucose, Bld: 179 mg/dL — ABNORMAL HIGH (ref 70–99)
Potassium: 3.3 mmol/L — ABNORMAL LOW (ref 3.5–5.1)
Sodium: 139 mmol/L (ref 135–145)
Total Bilirubin: 0.5 mg/dL (ref 0.3–1.2)
Total Protein: 6.1 g/dL — ABNORMAL LOW (ref 6.5–8.1)

## 2022-10-11 LAB — CBC WITH DIFFERENTIAL/PLATELET
Abs Immature Granulocytes: 0.02 10*3/uL (ref 0.00–0.07)
Basophils Absolute: 0 10*3/uL (ref 0.0–0.1)
Basophils Relative: 0 %
Eosinophils Absolute: 0.1 10*3/uL (ref 0.0–0.5)
Eosinophils Relative: 1 %
HCT: 34.8 % — ABNORMAL LOW (ref 36.0–46.0)
Hemoglobin: 11.4 g/dL — ABNORMAL LOW (ref 12.0–15.0)
Immature Granulocytes: 0 %
Lymphocytes Relative: 13 %
Lymphs Abs: 0.7 10*3/uL (ref 0.7–4.0)
MCH: 29.8 pg (ref 26.0–34.0)
MCHC: 32.8 g/dL (ref 30.0–36.0)
MCV: 91.1 fL (ref 80.0–100.0)
Monocytes Absolute: 0.4 10*3/uL (ref 0.1–1.0)
Monocytes Relative: 7 %
Neutro Abs: 4.4 10*3/uL (ref 1.7–7.7)
Neutrophils Relative %: 79 %
Platelets: 203 10*3/uL (ref 150–400)
RBC: 3.82 MIL/uL — ABNORMAL LOW (ref 3.87–5.11)
RDW: 13.2 % (ref 11.5–15.5)
WBC: 5.6 10*3/uL (ref 4.0–10.5)
nRBC: 0 % (ref 0.0–0.2)

## 2022-10-11 LAB — CBC
HCT: 34.7 % — ABNORMAL LOW (ref 36.0–46.0)
Hemoglobin: 11.3 g/dL — ABNORMAL LOW (ref 12.0–15.0)
MCH: 29.9 pg (ref 26.0–34.0)
MCHC: 32.6 g/dL (ref 30.0–36.0)
MCV: 91.8 fL (ref 80.0–100.0)
Platelets: 200 10*3/uL (ref 150–400)
RBC: 3.78 MIL/uL — ABNORMAL LOW (ref 3.87–5.11)
RDW: 13.2 % (ref 11.5–15.5)
WBC: 5.1 10*3/uL (ref 4.0–10.5)
nRBC: 0 % (ref 0.0–0.2)

## 2022-10-11 LAB — TROPONIN I (HIGH SENSITIVITY)
Troponin I (High Sensitivity): 62 ng/L — ABNORMAL HIGH (ref <18)
Troponin I (High Sensitivity): 101 ng/L (ref <18)
Troponin I (High Sensitivity): 109 ng/L (ref <18)
Troponin I (High Sensitivity): 107 ng/L (ref <18)

## 2022-10-11 LAB — MAGNESIUM: Magnesium: 2.2 mg/dL (ref 1.7–2.4)

## 2022-10-11 LAB — CREATININE, SERUM
Creatinine, Ser: 1.56 mg/dL — ABNORMAL HIGH (ref 0.44–1.00)
GFR, Estimated: 34 mL/min — ABNORMAL LOW (ref >60)

## 2022-10-11 MED ORDER — LEVOTHYROXINE SODIUM 100 MCG PO TABS
100.0000 ug | ORAL_TABLET | Freq: Every day | ORAL | Status: DC
  Administered 2022-10-12: 100 ug via ORAL
  Filled 2022-10-11: qty 1

## 2022-10-11 MED ORDER — METOPROLOL SUCCINATE ER 100 MG PO TB24
100.0000 mg | ORAL_TABLET | Freq: Two times a day (BID) | ORAL | Status: DC
  Administered 2022-10-11 – 2022-10-12 (×2): 100 mg via ORAL
  Filled 2022-10-11 (×2): qty 1

## 2022-10-11 MED ORDER — LISINOPRIL 20 MG PO TABS: 40.0000 mg | ORAL_TABLET | Freq: Every day | ORAL | Status: DC

## 2022-10-11 MED ORDER — ASPIRIN 81 MG PO TBEC
81.0000 mg | DELAYED_RELEASE_TABLET | Freq: Every day | ORAL | Status: DC
  Administered 2022-10-12: 81 mg via ORAL
  Filled 2022-10-11: qty 1

## 2022-10-11 MED ORDER — SPIRONOLACTONE 12.5 MG HALF TABLET: 12.5000 mg | ORAL_TABLET | Freq: Every day | ORAL | Status: DC

## 2022-10-11 MED ORDER — SPIRONOLACTONE 12.5 MG HALF TABLET
12.5000 mg | ORAL_TABLET | Freq: Every day | ORAL | Status: DC
  Administered 2022-10-12: 12.5 mg via ORAL
  Filled 2022-10-11: qty 1

## 2022-10-11 MED ORDER — CEPHALEXIN 250 MG PO CAPS
250.0000 mg | ORAL_CAPSULE | Freq: Every day | ORAL | Status: DC
  Filled 2022-10-11: qty 1

## 2022-10-11 MED ORDER — PANTOPRAZOLE SODIUM 40 MG PO TBEC
40.0000 mg | DELAYED_RELEASE_TABLET | Freq: Every day | ORAL | Status: DC
  Administered 2022-10-12: 40 mg via ORAL
  Filled 2022-10-11 (×2): qty 1

## 2022-10-11 MED ORDER — POTASSIUM CHLORIDE CRYS ER 20 MEQ PO TBCR
40.0000 meq | EXTENDED_RELEASE_TABLET | Freq: Once | ORAL | Status: AC
  Administered 2022-10-11: 40 meq via ORAL
  Filled 2022-10-11: qty 2

## 2022-10-11 MED ORDER — HYDROCHLOROTHIAZIDE 12.5 MG PO TABS: 12.5000 mg | ORAL_TABLET | Freq: Every day | ORAL | Status: DC

## 2022-10-11 MED ORDER — CEPHALEXIN 250 MG PO CAPS
250.0000 mg | ORAL_CAPSULE | Freq: Every day | ORAL | Status: DC
  Administered 2022-10-12: 250 mg via ORAL
  Filled 2022-10-11: qty 1

## 2022-10-11 MED ORDER — HYDROCHLOROTHIAZIDE 12.5 MG PO TABS
12.5000 mg | ORAL_TABLET | Freq: Every day | ORAL | Status: DC
  Administered 2022-10-12: 12.5 mg via ORAL
  Filled 2022-10-11: qty 1

## 2022-10-11 MED ORDER — ENOXAPARIN SODIUM 30 MG/0.3ML IJ SOSY
30.0000 mg | PREFILLED_SYRINGE | INTRAMUSCULAR | Status: DC
  Administered 2022-10-11: 30 mg via SUBCUTANEOUS
  Filled 2022-10-11: qty 0.3

## 2022-10-11 MED ORDER — METOPROLOL TARTRATE 25 MG PO TABS: 25.0000 mg | ORAL_TABLET | Freq: Two times a day (BID) | ORAL | Status: DC | PRN

## 2022-10-11 MED ORDER — SPIRONOLACTONE-HCTZ 25-25 MG PO TABS: 0.5000 | ORAL_TABLET | Freq: Every day | ORAL | Status: DC

## 2022-10-11 NOTE — ED Provider Notes (Addendum)
[place] EMERGENCY DEPARTMENT AT [facility] Provider Note   CSN: [id number] Arrival date & time: 10/11/22  1107     History  Chief Complaint  Patient presents with   Chest Pain    Jasmine Wheeler is a 77 y.o. female.  Patient with a history of SVT had onset of rapid heart rate up to around 162 by her pulse ox at 10 this morning associated with chest pressure substernally and then radiating to both shoulders.  Some radiating to the back.  Symptoms resolved at 11:00 this morning.  Patient currently asymptomatic.  Was associated with diaphoresis and some nausea.  Patient is supposed to take Lopressor as a rescue med when she gets her SVT 25 mg but she did not think to do that.  But she is feeling fine currently.  EMS noted that she vagal down and converted to normal sinus rhythm as they were arriving.  Patient also received 4 mg of Zofran.  Past medical history doing for hypertension hypothyroidism abnormality of the aortic valve chronic kidney disease gastroesophageal reflux disease hyperlipidemia supraventricular tachycardia and spinal stenosis.  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  Done by Dr. Corliss Skains.  Abdominal hysterectomy patient is never used tobacco products.       Home Medications Prior to Admission medications   Medication Sig Start Date End Date Taking? Authorizing Provider  albuterol (VENTOLIN HFA) 108 (90 Base) MCG/ACT inhaler INHALE 2 PUFFS EVERY SIX HOURS AS NEEDED FOR WHEEZING OR SHORTNESS OF BREATH 04/17/22   Nafziger, Kandee Keen, NP  Ascorbic Acid (VITAMIN C) 500 MG tablet daily. Take 2 tablet daily    [provider]  aspirin 81 MG EC tablet Take 1 tablet (81 mg total) by mouth daily with breakfast. 03/14/19   Mariea Clonts, Courage, MD  Calcium Carbonate-Vitamin D (CALCIUM-VITAMIN D) 500-200 MG-UNIT per tablet Take 1 tablet by mouth 2 (two) times daily with a meal.    [provider]   cephALEXin (KEFLEX) 250 MG capsule Take 1 capsule (250 mg total) by mouth at bedtime. 10/08/22   McKenzie, Mardene Celeste, MD  cetirizine (ZYRTEC) 10 MG tablet Take 10 mg by mouth daily.    [provider]  estradiol (ESTRACE) 0.1 MG/GM vaginal cream Place 1 Applicatorful vaginally 3 (three) times a week. 10/08/22   McKenzie, Mardene Celeste, MD  Evolocumab (REPATHA SURECLICK) 140 MG/ML SOAJ Inject 140 mg into the skin every 14 (fourteen) days. 05/13/22   Croitoru, Mihai, MD  fluticasone (FLONASE) 50 MCG/ACT nasal spray USE 2 SPRAYS INTO BOTH NOSTRILS DAILY. 09/24/22   Nafziger, Kandee Keen, NP  lisinopril (ZESTRIL) 40 MG tablet TAKE 1 TABLET EVERY DAY 09/09/22   Croitoru, Mihai, MD  Melatonin 5 MG CAPS Take by mouth at bedtime.    [provider]  metoprolol succinate (TOPROL-XL) 100 MG 24 hr tablet TAKE 1 TABLET IN THE MORNING AND AT BEDTIME. MUST KEEP SCHEDULED APPOINTMENT 06/03/22   Croitoru, Mihai, MD  metoprolol tartrate (LOPRESSOR) 25 MG tablet TAKE 1 TABLET AS DIRECTED AS NEEDED FOR PALPITATIONS 08/18/22   Croitoru, Mihai, MD  Multiple Vitamin (MULTIVITAMIN) tablet Take 1 tablet by mouth daily.    [provider]  Multiple Vitamins-Minerals (VITAMIN D3 COMPLETE PO) Take by mouth.    [provider]  pantoprazole (PROTONIX) 40 MG tablet TAKE 1 TABLET EVERY DAY BEFORE BREAKFAST 06/03/22   Iva Boop, MD  Peppermint Oil 90 MG CPCR Take 2 capsules by mouth 2 (two) times  daily.    [provider]  spironolactone-hydrochlorothiazide (ALDACTAZIDE) 25-25 MG tablet Take 0.5 tablets by mouth daily. 03/21/22   Croitoru, Mihai, MD  SYNTHROID 100 MCG tablet TAKE 1 TABLET EVERY MORNING BEFORE BREAKFAST 07/21/22   Nafziger, Kandee Keen, NP  vitamin B-12 (CYANOCOBALAMIN) 500 MCG tablet Take 500 mcg by mouth daily.    [provider]      Allergies    Dicyclomine, Nifedipine, Amlodipine, Nitrofurantoin, Rosuvastatin, Simvastatin, and Theophylline    Review of Systems   Review of  Systems  Constitutional:  Negative for chills and fever.  HENT:  Negative for ear pain and sore throat.   Eyes:  Negative for pain and visual disturbance.  Respiratory:  Negative for cough and shortness of breath.   Cardiovascular:  Positive for chest pain and palpitations.  Gastrointestinal:  Negative for abdominal pain and vomiting.  Genitourinary:  Negative for dysuria and hematuria.  Musculoskeletal:  Negative for arthralgias and back pain.  Skin:  Negative for color change and rash.  Neurological:  Negative for seizures and syncope.  All other systems reviewed and are negative.   Physical Exam Updated Vital Signs BP (!) 137/93   Pulse 64   Temp 98.8 F (37.1 C) (Oral)   Resp 20   SpO2 98%  Physical Exam Vitals and nursing note reviewed.  Constitutional:      General: She is not in acute distress.    Appearance: Normal appearance. She is well-developed.  HENT:     Head: Normocephalic and atraumatic.  Eyes:     Extraocular Movements: Extraocular movements intact.     Conjunctiva/sclera: Conjunctivae normal.     Pupils: Pupils are equal, round, and reactive to light.  Cardiovascular:     Rate and Rhythm: Normal rate and regular rhythm.     Heart sounds: No murmur heard. Pulmonary:     Effort: Pulmonary effort is normal. No respiratory distress.     Breath sounds: Normal breath sounds. No wheezing or rales.  Abdominal:     Palpations: Abdomen is soft.     Tenderness: There is no abdominal tenderness.  Musculoskeletal:        General: No swelling.     Cervical back: Normal range of motion and neck supple.     Right lower leg: No edema.     Left lower leg: No edema.  Skin:    General: Skin is warm and dry.     Capillary Refill: Capillary refill takes less than 2 seconds.  Neurological:     General: No focal deficit present.     Mental Status: She is alert and oriented to person, place, and time.  Psychiatric:        Mood and Affect: Mood normal.     ED Results  / Procedures / Treatments   Labs (all labs ordered are listed, but only abnormal results are displayed) Labs Reviewed  CBC WITH DIFFERENTIAL/PLATELET - Abnormal; Notable for the following components:      Result Value   RBC 3.82 (*)    Hemoglobin 11.4 (*)    HCT 34.8 (*)    All other components within normal limits  COMPREHENSIVE METABOLIC PANEL - Abnormal; Notable for the following components:   Potassium 3.3 (*)    Chloride 95 (*)    Glucose, Bld 179 (*)    BUN 26 (*)    Creatinine, Ser 1.57 (*)    Total Protein 6.1 (*)    GFR, Estimated 33 (*)    All  other components within normal limits  TROPONIN I (HIGH SENSITIVITY) - Abnormal; Notable for the following components:   Troponin I (High Sensitivity) 62 (*)    All other components within normal limits  TROPONIN I (HIGH SENSITIVITY) - Abnormal; Notable for the following components:   Troponin I (High Sensitivity) 101 (*)    All other components within normal limits    EKG EKG Interpretation  Date/Time:  Saturday October 11 2022 11:58:18 EDT Ventricular Rate:  66 PR Interval:  219 QRS Duration: 100 QT Interval:  429 QTC Calculation: 450 R Axis:   -65 Text Interpretation: Sinus rhythm Borderline prolonged PR interval Left anterior fascicular block Left ventricular hypertrophy Probable anterior infarct, age indeterminate No significant change since last tracing Confirmed by Vanetta Mulders [phone]) on 10/11/2022 12:05:10 PM  Radiology DG Chest Port 1 View  Result Date: 10/11/2022 CLINICAL DATA:  Chest pain EXAM: PORTABLE CHEST 1 VIEW COMPARISON:  04/09/2014 FINDINGS: Cardiomegaly. Aortic atherosclerosis. No focal airspace consolidation, pleural effusion, or pneumothorax. IMPRESSION: Cardiomegaly without acute cardiopulmonary disease. Electronically Signed   By: Duanne Guess D.O.   On: 10/11/2022 12:42    Procedures Procedures    Medications Ordered in ED Medications  potassium chloride SA (KLOR-CON M) CR tablet 40 mEq  (40 mEq Oral Given 10/11/22 1554)    ED Course/ Medical Decision Making/ A&P                             Medical Decision Making Amount and/or Complexity of Data Reviewed Labs: ordered. Radiology: ordered.  Risk Prescription drug management. Decision regarding hospitalization.   Patient in sinus rhythm here.  Patient currently asymptomatic.  CBC white count 5.6 hemoglobin 11.4 platelets 204.  Complete metabolic panel significant for potassium 3.3 GFR 33 LFTs normal.  Blood sugar is 179.  Will give some oral potassium.  Initial troponin was 62.  Delta troponin will be important.  But the troponin is 62 not unexpected with the rapid heart rate that she had at home.  Patient does state that she is got a history of an aortic aneurysm.  But the patient without any significant back pain.  And is completely asymptomatic currently.  Which is very reassuring.  Chest x-ray shows some cardiomegaly without acute cardiopulmonary disease.  Patient without any arrhythmias here.  If delta troponin is not significantly changed patient can be discharged home with follow-up with her cardiologist.  Followed by [facility] cardiology.  Patient's delta troponin did jump up to 101.  Fairly significant increase patient has remained asymptomatic since her heart rate went back to normal so this could have just been stress rate related.  However will need to contact hospitalist for admission to trend the troponins.  Patient is asymptomatic.  Discussed with on-call cardiology they will see the patient in consultation they are recommending an echocardiogram in the morning.  And obviously trending of the troponins.  They feel that the elevated troponins are probably secondary to the rapid heart rate.   Final Clinical Impression(s) / ED Diagnoses Final diagnoses:  Palpitations  Precordial pain  Elevated troponin    Rx / DC Orders ED Discharge Orders     None         Vanetta Mulders, MD 10/11/22 1422     Vanetta Mulders, MD 10/11/22 1709    Vanetta Mulders, MD 10/11/22 1735    Vanetta Mulders, MD 10/11/22 1737
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
## 2022-10-11 NOTE — Progress Notes (Signed)
Patient ID: Jasmine Wheeler, female   DOB: [birth year], 77 y.o.   MRN: [id number]   ED TO INPATIENT HANDOFF REPORT by April S RN reviewed.  Lidia Collum, RN

## 2022-10-11 NOTE — ED Triage Notes (Signed)
Patient brought in by EMS from home this morning with complaints of chest pressure that radiated to right shoulder. Patient was having complaints on sweating and nausea. Had some changes on her EKG in route and she vagaled down and concerted to NSR. 4mg  of zofran was given in route. No further complaints of chest pressures

## 2022-10-12 ENCOUNTER — Observation Stay (HOSPITAL_BASED_OUTPATIENT_CLINIC_OR_DEPARTMENT_OTHER): Payer: Medicare HMO

## 2022-10-12 DIAGNOSIS — I471 Supraventricular tachycardia, unspecified: Secondary | ICD-10-CM

## 2022-10-12 DIAGNOSIS — N1832 Chronic kidney disease, stage 3b: Secondary | ICD-10-CM | POA: Diagnosis not present

## 2022-10-12 DIAGNOSIS — R7989 Other specified abnormal findings of blood chemistry: Secondary | ICD-10-CM | POA: Diagnosis not present

## 2022-10-12 DIAGNOSIS — I10 Essential (primary) hypertension: Secondary | ICD-10-CM

## 2022-10-12 DIAGNOSIS — I351 Nonrheumatic aortic (valve) insufficiency: Secondary | ICD-10-CM

## 2022-10-12 DIAGNOSIS — E876 Hypokalemia: Secondary | ICD-10-CM

## 2022-10-12 DIAGNOSIS — R079 Chest pain, unspecified: Secondary | ICD-10-CM | POA: Diagnosis not present

## 2022-10-12 LAB — COMPREHENSIVE METABOLIC PANEL
ALT: 15 U/L (ref 0–44)
AST: 21 U/L (ref 15–41)
Albumin: 3.4 g/dL — ABNORMAL LOW (ref 3.5–5.0)
Alkaline Phosphatase: 53 U/L (ref 38–126)
Anion gap: 7 (ref 5–15)
BUN: 27 mg/dL — ABNORMAL HIGH (ref 8–23)
CO2: 31 mmol/L (ref 22–32)
Calcium: 9.3 mg/dL (ref 8.9–10.3)
Chloride: 99 mmol/L (ref 98–111)
Creatinine, Ser: 1.56 mg/dL — ABNORMAL HIGH (ref 0.44–1.00)
GFR, Estimated: 34 mL/min — ABNORMAL LOW (ref >60)
Glucose, Bld: 112 mg/dL — ABNORMAL HIGH (ref 70–99)
Potassium: 3.8 mmol/L (ref 3.5–5.1)
Sodium: 137 mmol/L (ref 135–145)
Total Bilirubin: 0.6 mg/dL (ref 0.3–1.2)
Total Protein: 5.5 g/dL — ABNORMAL LOW (ref 6.5–8.1)

## 2022-10-12 LAB — ECHOCARDIOGRAM COMPLETE
AR max vel: 2.52 cm2
AV Area VTI: 2.41 cm2
AV Area mean vel: 2.43 cm2
AV Mean grad: 3 mmHg
AV Peak grad: 5.2 mmHg
Ao pk vel: 1.14 m/s
Area-P 1/2: 5.84 cm2
Height: 62.5 in
P 1/2 time: 540 msec
S' Lateral: 3.1 cm
Weight: 2719.59 oz

## 2022-10-12 LAB — CBC
HCT: 33.6 % — ABNORMAL LOW (ref 36.0–46.0)
Hemoglobin: 10.9 g/dL — ABNORMAL LOW (ref 12.0–15.0)
MCH: 29.6 pg (ref 26.0–34.0)
MCHC: 32.4 g/dL (ref 30.0–36.0)
MCV: 91.3 fL (ref 80.0–100.0)
Platelets: 189 10*3/uL (ref 150–400)
RBC: 3.68 MIL/uL — ABNORMAL LOW (ref 3.87–5.11)
RDW: 13.3 % (ref 11.5–15.5)
WBC: 5.5 10*3/uL (ref 4.0–10.5)
nRBC: 0 % (ref 0.0–0.2)

## 2022-10-12 MED ORDER — HYDRALAZINE HCL 20 MG/ML IJ SOLN: 10.0000 mg | INTRAMUSCULAR | Status: DC | PRN

## 2022-10-12 MED ORDER — LISINOPRIL 20 MG PO TABS
40.0000 mg | ORAL_TABLET | Freq: Every day | ORAL | Status: DC
  Administered 2022-10-12: 40 mg via ORAL
  Filled 2022-10-12: qty 2

## 2022-10-12 MED ORDER — SPIRONOLACTONE-HCTZ 25-25 MG PO TABS: 1.0000 | ORAL_TABLET | Freq: Every day | ORAL | 0 refills | Status: DC

## 2022-10-12 MED ORDER — ACETAMINOPHEN 325 MG PO TABS
650.0000 mg | ORAL_TABLET | Freq: Four times a day (QID) | ORAL | Status: DC | PRN
  Administered 2022-10-12: 650 mg via ORAL
  Filled 2022-10-12: qty 2

## 2022-10-12 NOTE — Plan of Care (Signed)
Patient ID: Jasmine Wheeler, female   DOB: 03/20/1946, 77 y.o.   MRN: [id number]  Problem: Education: Goal: Knowledge of General Education information will improve Description: Including pain rating scale, medication(s)/side effects and non-pharmacologic comfort measures Outcome: Adequate for Discharge   Problem: Health Behavior/Discharge Planning: Goal: Ability to manage health-related needs will improve Outcome: Adequate for Discharge   Problem: Clinical Measurements: Goal: Ability to maintain clinical measurements within normal limits will improve Outcome: Adequate for Discharge Goal: Will remain free from infection Outcome: Adequate for Discharge Goal: Diagnostic test results will improve Outcome: Adequate for Discharge Goal: Respiratory complications will improve Outcome: Adequate for Discharge Goal: Cardiovascular complication will be avoided Outcome: Adequate for Discharge   Problem: Activity: Goal: Risk for activity intolerance will decrease Outcome: Adequate for Discharge   Problem: Nutrition: Goal: Adequate nutrition will be maintained Outcome: Adequate for Discharge   Problem: Coping: Goal: Level of anxiety will decrease Outcome: Adequate for Discharge   Problem: Elimination: Goal: Will not experience complications related to bowel motility Outcome: Adequate for Discharge Goal: Will not experience complications related to urinary retention Outcome: Adequate for Discharge   Problem: Pain Managment: Goal: General experience of comfort will improve Outcome: Adequate for Discharge   Problem: Safety: Goal: Ability to remain free from injury will improve Outcome: Adequate for Discharge   Problem: Skin Integrity: Goal: Risk for impaired skin integrity will decrease Outcome: Adequate for Discharge    Lidia Collum, RN

## 2022-10-12 NOTE — Progress Notes (Signed)
PROGRESS NOTE   Jasmine Wheeler  GEX:[id number] DOB: Apr 17, 1946 DOA: 10/11/2022 PCP: Shirline Frees, NP   Chief Complaint  Patient presents with   Chest Pain   Level of care: Telemetry Cardiac  Brief Admission History:  77 y.o. female with medical history significant of SVT her heart rate is usually between 1 60-1 75.  She does vagal maneuvers and is able to stop the tachycardia.  However this admission she was not able to do none of those.  Her heart rate went up to 162 and eventually came down on its own without taking any Lopressor.  It started while at rest and she felt chest pressure that was radiating to both shoulders with shortness of breath and nausea.  She had been taking metoprolol as needed. Her past medical history is significant for CKD stage IIIa follows up with nephrology, GERD, hyperlipidemia on Repatha, hypertension, hypothyroidism. Last TSH 3.48 in March 2024, A1c 5.7 Denies drinking alcohol tobacco abuse or excess caffeine intake.  She denies fever chills cough abdominal pain diarrhea or urinary complaints.  She was otherwise in her usual state of health until this morning.   Patient saw Dr. Ladona Ridgel in January 2024 for evaluation of syncope.   ED Course: Potassium 3.3 Mag pending Creatinine 1.57 up from prior Troponin 101 from 62 3 years ago it troponin peaked at 310 EKG T inversions flattening T waves anterolateral leads unchanged   Assessment and Plan:  SVT  - present on admission - symptomatic with palpitations on arrival  - continue telemetry monitoring - monitoring electrolytes, replacing as needed  - 2D echocardiogram pending  - toprol XL 100 mg daily ordered  - cardiology consulted in ED  Essential Hypertension  - suboptimally controlled - resumed home toprol XL 100 mg daily  - resumed home spironolactone/hydrochlorothiazide - adding IV prn medication as needed   Hypokalemia - this has been repleted, recheck in AM with Mg  Hypothyroidism  -  resumed home dose levothyroxine  Stage 3b CKD - stable creatinine - working on better BP control   Elevated troponin  - demand ischemia from SVT - no s/s of ACS   DVT prophylaxis: enoxaparin  Code Status: Full  Family Communication:     Consultants:  cardio Procedures:  TTE pending  Antimicrobials:    Subjective: Pt denies further palpitations, no CP, no SOB.   Objective: Vitals:   10/12/22 0010 10/12/22 0500 10/12/22 0725 10/12/22 0730  BP: (!) 170/70 (!) 169/79  (!) 179/83  Pulse: (!) 56 (!) 57 (!) 58   Resp: 19 18 (!) 21 (!) 22  Temp:  98 F (36.7 C) 97.8 F (36.6 C) 97.8 F (36.6 C)  TempSrc:      SpO2: 97% 94% 95%   Weight:  77.1 kg    Height:        Intake/Output Summary (Last 24 hours) at 10/12/2022 1006 Last data filed at 10/12/2022 0759 Gross per 24 hour  Intake 360 ml  Output --  Net 360 ml   Filed Weights   10/11/22 1850 10/12/22 0500  Weight: 77.7 kg 77.1 kg   Examination:  General exam: frail, elderly female, awake, alert, cooperative, Appears calm and comfortable  Respiratory system: Clear to auscultation. Respiratory effort normal. Cardiovascular system: normal S1 & S2 heard. No JVD, murmurs, rubs, gallops or clicks. No pedal edema. Gastrointestinal system: Abdomen is nondistended, soft and nontender. No organomegaly or masses felt. Normal bowel sounds heard. Central nervous system: Alert and oriented. No focal  neurological deficits. Extremities: Symmetric 5 x 5 power. Skin: No rashes, lesions or ulcers. Psychiatry: Judgement and insight appear normal. Mood & affect appropriate.   Data Reviewed: I have personally reviewed following labs and imaging studies  CBC: Recent Labs  Lab 10/11/22 1237 10/11/22 1758 10/12/22 0035  WBC 5.6 5.1 5.5  NEUTROABS 4.4  --   --   HGB 11.4* 11.3* 10.9*  HCT 34.8* 34.7* 33.6*  MCV 91.1 91.8 91.3  PLT 203 200 189    Basic Metabolic Panel: Recent Labs  Lab 10/11/22 1237 10/11/22 1758  10/12/22 0035  NA 139  --  137  K 3.3*  --  3.8  CL 95*  --  99  CO2 30  --  31  GLUCOSE 179*  --  112*  BUN 26*  --  27*  CREATININE 1.57* 1.56* 1.56*  CALCIUM 9.6  --  9.3  MG  --  2.2  --     CBG: No results for input(s): "GLUCAP" in the last 168 hours.  No results found for this or any previous visit (from the past 240 hour(s)).   Radiology Studies: DG Chest Port 1 View  Result Date: 10/11/2022 CLINICAL DATA:  Chest pain EXAM: PORTABLE CHEST 1 VIEW COMPARISON:  04/09/2014 FINDINGS: Cardiomegaly. Aortic atherosclerosis. No focal airspace consolidation, pleural effusion, or pneumothorax. IMPRESSION: Cardiomegaly without acute cardiopulmonary disease. Electronically Signed   By: Duanne Guess D.O.   On: 10/11/2022 12:42    Scheduled Meds:  aspirin EC  81 mg Oral Q breakfast   cephALEXin  250 mg Oral Daily   enoxaparin (LOVENOX) injection  30 mg Subcutaneous Q24H   spironolactone  12.5 mg Oral Daily   And   hydrochlorothiazide  12.5 mg Oral Daily   levothyroxine  100 mcg Oral Q0600   metoprolol succinate  100 mg Oral BID   pantoprazole  40 mg Oral Daily   Continuous Infusions:   LOS: 0 days   Time spent: 35 mins  Khushboo Chuck Laural Benes, MD How to contact the [facility] Attending or Consulting provider 7A - 7P or covering provider during after hours 7P -7A, for this patient?  Check the care team in [facility] and look for a) attending/consulting [facility] provider listed and b) the [facility] team listed Log into [email or web address] and use [place]'s universal password to access. If you do not have the password, please contact the hospital operator. Locate the [facility] provider you are looking for under [facility] and page to a number that you can be directly reached. If you still have difficulty reaching the provider, please page the [facility] (Director on Call) for the Hospitalists listed on amion for assistance.  10/12/2022, 10:06 AM

## 2022-10-12 NOTE — Hospital Course (Signed)
77 y.o. female with medical history significant of SVT her heart rate is usually between 1 60-1 75.  She does vagal maneuvers and is able to stop the tachycardia.  However this admission she was not able to do none of those.  Her heart rate went up to 162 and eventually came down on its own without taking any Lopressor.  It started while at rest and she felt chest pressure that was radiating to both shoulders with shortness of breath and nausea.  She had been taking metoprolol as needed. Her past medical history is significant for CKD stage IIIa follows up with nephrology, GERD, hyperlipidemia on Repatha, hypertension, hypothyroidism. Last TSH 3.48 in March 2024, A1c 5.7 Denies drinking alcohol tobacco abuse or excess caffeine intake.  She denies fever chills cough abdominal pain diarrhea or urinary complaints.  She was otherwise in her usual state of health until this morning.   Patient saw Dr. Ladona Ridgel in January 2024 for evaluation of syncope.   ED Course: Potassium 3.3 Mag pending Creatinine 1.57 up from prior Troponin 101 from 62 3 years ago it troponin peaked at 310 EKG T inversions flattening T waves anterolateral leads unchanged

## 2022-10-12 NOTE — Progress Notes (Signed)
Patient ID: Jasmine Wheeler, female   DOB: 04/24/1945, 77 y.o.   MRN: [id number]   An After Visit Summary was printed and given to the patient.  Patient education given on medication changes and follow up appointments and the patient expresses understanding and acceptance of instructions.     Lidia Collum 10/12/2022 2:27 PM

## 2022-10-12 NOTE — Discharge Instructions (Signed)
IMPORTANT INFORMATION: PAY CLOSE ATTENTION  ? ?PHYSICIAN DISCHARGE INSTRUCTIONS ? ?Follow with Primary care provider  Nafziger, Cory, NP  and other consultants as instructed by your Hospitalist Physician ? ?SEEK MEDICAL CARE OR RETURN TO EMERGENCY ROOM IF SYMPTOMS COME BACK, WORSEN OR NEW PROBLEM DEVELOPS  ? ?Please note: ?You were cared for by a hospitalist during your hospital stay. Every effort will be made to forward records to your primary care provider.  You can request that your primary care provider send for your hospital records if they have not received them.  Once you are discharged, your primary care physician will handle any further medical issues. Please note that NO REFILLS for any discharge medications will be authorized once you are discharged, as it is imperative that you return to your primary care physician (or establish a relationship with a primary care physician if you do not have one) for your post hospital discharge needs so that they can reassess your need for medications and monitor your lab values. ? ?Please get a complete blood count and chemistry panel checked by your Primary MD at your next visit, and again as instructed by your Primary MD. ? ?Get Medicines reviewed and adjusted: ?Please take all your medications with you for your next visit with your Primary MD ? ?Laboratory/radiological data: ?Please request your Primary MD to go over all hospital tests and procedure/radiological results at the follow up, please ask your primary care provider to get all Hospital records sent to his/her office. ? ?In some cases, they will be blood work, cultures and biopsy results pending at the time of your discharge. Please request that your primary care provider follow up on these results. ? ?If you are diabetic, please bring your blood sugar readings with you to your follow up appointment with primary care.   ? ?Please call and make your follow up appointments as soon as possible.   ? ?Also Note  the following: ?If you experience worsening of your admission symptoms, develop shortness of breath, life threatening emergency, suicidal or homicidal thoughts you must seek medical attention immediately by calling 911 or calling your MD immediately  if symptoms less severe. ? ?You must read complete instructions/literature along with all the possible adverse reactions/side effects for all the Medicines you take and that have been prescribed to you. Take any new Medicines after you have completely understood and accpet all the possible adverse reactions/side effects.  ? ?Do not drive when taking Pain medications or sleeping medications (Benzodiazepines) ? ?Do not take more than prescribed Pain, Sleep and Anxiety Medications. It is not advisable to combine anxiety,sleep and pain medications without talking with your primary care practitioner ? ?Special Instructions: If you have smoked or chewed Tobacco  in the last 2 yrs please stop smoking, stop any regular Alcohol  and or any Recreational drug use. ? ?Wear Seat belts while driving.  Do not drive if taking any narcotic, mind altering or controlled substances or recreational drugs or alcohol.  ? ? ? ? ? ?
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
## 2022-10-12 NOTE — Discharge Summary (Signed)
Physician Discharge Summary  Jasmine Wheeler ZOX:[id number] DOB: [birth year] DOA: 10/11/2022  PCP: Shirline Frees, NP Cardiology: [place]  Admit date: 10/11/2022 Discharge date: 10/12/2022  Admitted From:  Home  Disposition: Home   Recommendations for Outpatient Follow-up:  Follow up with PCP in 1 weeks Follow up with Dr. Ladona Ridgel as scheduled   Discharge Condition: STABLE   CODE STATUS: FULL DIET:  resume heart healthy   Brief Hospitalization Summary: Please see all hospital notes, images, labs for full details of the hospitalization. ADMISSION PROVIDER HPI:  77 y.o. female with medical history significant of SVT her heart rate is usually between 1 60-1 75.  She does vagal maneuvers and is able to stop the tachycardia.  However this admission she was not able to do none of those.  Her heart rate went up to 162 and eventually came down on its own without taking any Lopressor.  It started while at rest and she felt chest pressure that was radiating to both shoulders with shortness of breath and nausea.  She had been taking metoprolol as needed. Her past medical history is significant for CKD stage IIIa follows up with nephrology, GERD, hyperlipidemia on Repatha, hypertension, hypothyroidism. Last TSH 3.48 in March 2024, A1c 5.7 Denies drinking alcohol tobacco abuse or excess caffeine intake.  She denies fever chills cough abdominal pain diarrhea or urinary complaints.  She was otherwise in her usual state of health until this morning.   Patient saw Dr. Ladona Ridgel in January 2024 for evaluation of syncope.   ED Course: Potassium 3.3 Mag pending Creatinine 1.57 up from prior Troponin 101 from 62 3 years ago it troponin peaked at 310 EKG T inversions flattening T waves anterolateral leads unchanged  HOSPITAL COURSE BY PROBLEM   SVT - RESOLVED NOW - present on admission - symptomatic with palpitations on arrival  - continue telemetry monitoring - monitoring electrolytes, replacing as  needed  - 2D echocardiogram stable per Dr. Royann Shivers - toprol XL 100 mg daily ordered  - cardiology consulted and recommended outpatient follow up with EP, discussed with Dr. Royann Shivers, ok to discharge home with close outpatient f/u, no changes to cardiac meds.    Essential Hypertension  - suboptimally controlled - resumed home toprol XL 100 mg daily  - resumed home spironolactone/hydrochlorothiazide - resumed home lisinopril 40 mg  - further management on outpatient follow up    Hypokalemia - this has been repleted - recheck BMP in 2 weeks outpatient    Hypothyroidism  - resumed home dose levothyroxine   Stage 3b CKD - stable creatinine - working on better BP control    Elevated troponin  - demand ischemia from SVT - no s/s of ACS   Discharge Diagnoses:  Principal Problem:   Chest pain Active Problems:   Hypothyroidism   Essential hypertension   Elevated troponin   Hypokalemia   CKD stage 3b, GFR 30-44 ml/min [facility])   Discharge Instructions:  Allergies as of 10/12/2022       Reactions   Dicyclomine Nausea Only, Other (See Comments)   Nausea, tingling in her whole body, couldn't sleep, made lights bright   Nifedipine Other (See Comments)   Dropped blood pressure and heart rate too much   Amlodipine    Dizzy and feel like almost passing out   Nitrofurantoin Nausea And Vomiting   Rosuvastatin    MYALGIA   Simvastatin    MYALGIA   Theophylline Other (See Comments)   Patient cannot recall reaction  Medication List     TAKE these medications    albuterol 108 (90 Base) MCG/ACT inhaler Commonly known as: VENTOLIN HFA INHALE 2 PUFFS EVERY SIX HOURS AS NEEDED FOR WHEEZING OR SHORTNESS OF BREATH   ascorbic acid 500 MG tablet Commonly known as: VITAMIN C daily. Take 2 tablet daily   aspirin EC 81 MG tablet Take 1 tablet (81 mg total) by mouth daily with breakfast.   calcium-vitamin D 500-200 MG-UNIT tablet Take 1 tablet by mouth 2 (two) times daily  with a meal.   cephALEXin 250 MG capsule Commonly known as: Keflex Take 1 capsule (250 mg total) by mouth at bedtime.   cetirizine 10 MG tablet Commonly known as: ZYRTEC Take 10 mg by mouth daily.   estradiol 0.1 MG/GM vaginal cream Commonly known as: ESTRACE Place 1 Applicatorful vaginally 3 (three) times a week.   fluticasone 50 MCG/ACT nasal spray Commonly known as: FLONASE USE 2 SPRAYS INTO BOTH NOSTRILS DAILY.   lisinopril 40 MG tablet Commonly known as: ZESTRIL TAKE 1 TABLET EVERY DAY   Melatonin 5 MG Caps Take by mouth at bedtime.   metoprolol succinate 100 MG 24 hr tablet Commonly known as: TOPROL-XL TAKE 1 TABLET IN THE MORNING AND AT BEDTIME. MUST KEEP SCHEDULED APPOINTMENT   metoprolol tartrate 25 MG tablet Commonly known as: LOPRESSOR TAKE 1 TABLET AS DIRECTED AS NEEDED FOR PALPITATIONS   multivitamin tablet Take 1 tablet by mouth daily.   pantoprazole 40 MG tablet Commonly known as: PROTONIX TAKE 1 TABLET EVERY DAY BEFORE BREAKFAST   Peppermint Oil 90 MG Cpcr Take 2 capsules by mouth 2 (two) times daily.   Repatha SureClick 140 MG/ML Soaj Generic drug: Evolocumab Inject 140 mg into the skin every 14 (fourteen) days.   spironolactone-hydrochlorothiazide 25-25 MG tablet Commonly known as: ALDACTAZIDE Take 1 tablet by mouth daily. What changed: how much to take   Synthroid 100 MCG tablet Generic drug: levothyroxine TAKE 1 TABLET EVERY MORNING BEFORE BREAKFAST   vitamin B-12 500 MCG tablet Commonly known as: CYANOCOBALAMIN Take 500 mcg by mouth daily.   VITAMIN D3 COMPLETE PO Take by mouth.        Follow-up Information     Marinus Maw, MD Follow up on 10/22/2022.   Specialty: Cardiology Why: Cardiology Hospital Follow-up on 10/22/2022 at 3:15 PM. Will be at the [place] OFFICE (attached to [facility]). Contact information: [address] [place] [address] [phone]         Shirline Frees, NP. Schedule an  appointment as soon as possible for a visit in 1 week(s).   Specialty: Family Medicine Why: Hospital Follow Up Contact information: [address] [place] [address] [phone]                Allergies  Allergen Reactions   Dicyclomine Nausea Only and Other (See Comments)    Nausea, tingling in her whole body, couldn't sleep, made lights bright   Nifedipine Other (See Comments)    Dropped blood pressure and heart rate too much   Amlodipine     Dizzy and feel like almost passing out   Nitrofurantoin Nausea And Vomiting   Rosuvastatin     MYALGIA   Simvastatin     MYALGIA   Theophylline Other (See Comments)    Patient cannot recall reaction   Allergies as of 10/12/2022       Reactions   Dicyclomine Nausea Only, Other (See Comments)   Nausea, tingling in her whole  body, couldn't sleep, made lights bright   Nifedipine Other (See Comments)   Dropped blood pressure and heart rate too much   Amlodipine    Dizzy and feel like almost passing out   Nitrofurantoin Nausea And Vomiting   Rosuvastatin    MYALGIA   Simvastatin    MYALGIA   Theophylline Other (See Comments)   Patient cannot recall reaction        Medication List     TAKE these medications    albuterol 108 (90 Base) MCG/ACT inhaler Commonly known as: VENTOLIN HFA INHALE 2 PUFFS EVERY SIX HOURS AS NEEDED FOR WHEEZING OR SHORTNESS OF BREATH   ascorbic acid 500 MG tablet Commonly known as: VITAMIN C daily. Take 2 tablet daily   aspirin EC 81 MG tablet Take 1 tablet (81 mg total) by mouth daily with breakfast.   calcium-vitamin D 500-200 MG-UNIT tablet Take 1 tablet by mouth 2 (two) times daily with a meal.   cephALEXin 250 MG capsule Commonly known as: Keflex Take 1 capsule (250 mg total) by mouth at bedtime.   cetirizine 10 MG tablet Commonly known as: ZYRTEC Take 10 mg by mouth daily.   estradiol 0.1 MG/GM vaginal cream Commonly known as: ESTRACE Place 1 Applicatorful  vaginally 3 (three) times a week.   fluticasone 50 MCG/ACT nasal spray Commonly known as: FLONASE USE 2 SPRAYS INTO BOTH NOSTRILS DAILY.   lisinopril 40 MG tablet Commonly known as: ZESTRIL TAKE 1 TABLET EVERY DAY   Melatonin 5 MG Caps Take by mouth at bedtime.   metoprolol succinate 100 MG 24 hr tablet Commonly known as: TOPROL-XL TAKE 1 TABLET IN THE MORNING AND AT BEDTIME. MUST KEEP SCHEDULED APPOINTMENT   metoprolol tartrate 25 MG tablet Commonly known as: LOPRESSOR TAKE 1 TABLET AS DIRECTED AS NEEDED FOR PALPITATIONS   multivitamin tablet Take 1 tablet by mouth daily.   pantoprazole 40 MG tablet Commonly known as: PROTONIX TAKE 1 TABLET EVERY DAY BEFORE BREAKFAST   Peppermint Oil 90 MG Cpcr Take 2 capsules by mouth 2 (two) times daily.   Repatha SureClick 140 MG/ML Soaj Generic drug: Evolocumab Inject 140 mg into the skin every 14 (fourteen) days.   spironolactone-hydrochlorothiazide 25-25 MG tablet Commonly known as: ALDACTAZIDE Take 1 tablet by mouth daily. What changed: how much to take   Synthroid 100 MCG tablet Generic drug: levothyroxine TAKE 1 TABLET EVERY MORNING BEFORE BREAKFAST   vitamin B-12 500 MCG tablet Commonly known as: CYANOCOBALAMIN Take 500 mcg by mouth daily.   VITAMIN D3 COMPLETE PO Take by mouth.        Procedures/Studies: ECHOCARDIOGRAM COMPLETE  Result Date: 10/12/2022    ECHOCARDIOGRAM REPORT   Patient Name:   Jasmine Wheeler Date of Exam: 10/12/2022 Medical Rec #:  [id number]          Height:       62.5 in Accession #:    [id number]         Weight:       170.0 lb Date of Birth:  1945/07/06          BSA:          1.795 m Patient Age:    77 years           BP:           179/83 mmHg Patient Gender: F                  HR:  66 bpm. Exam Location:  Inpatient Procedure: 2D Echo, Cardiac Doppler and Color Doppler Indications:    Abnormal ECG  History:        Patient has prior history of Echocardiogram examinations, most                  recent 04/02/2022. Arrythmias:Tachycardia,                 Signs/Symptoms:Murmur; Risk Factors:Hypertension.  Sonographer:    Darlys Gales Referring Phys: [id number] ELIZABETH G MATHEWS IMPRESSIONS  1. Left ventricular ejection fraction, by estimation, is 55 to 60%. The left ventricle has normal function. The left ventricle has no regional wall motion abnormalities. There is moderate concentric left ventricular hypertrophy. Indeterminate diastolic filling due to E-A fusion.  2. Right ventricular systolic function is normal. The right ventricular size is normal.  3. Left atrial size was mildly dilated.  4. The mitral valve is grossly normal. Trivial mitral valve regurgitation. No evidence of mitral stenosis.  5. The aortic valve is grossly normal. There is mild calcification of the aortic valve. There is mild thickening of the aortic valve. Aortic valve regurgitation is mild. Aortic valve sclerosis/calcification is present, without any evidence of aortic stenosis. Aortic regurgitation PHT measures 540 msec. Aortic valve mean gradient measures 3.0 mmHg.  6. The inferior vena cava is normal in size with greater than 50% respiratory variability, suggesting right atrial pressure of 3 mmHg. Comparison(s): No prior Echocardiogram. Conclusion(s)/Recommendation(s): Otherwise normal echocardiogram, with minor abnormalities described in the report. FINDINGS  Left Ventricle: Left ventricular ejection fraction, by estimation, is 55 to 60%. The left ventricle has normal function. The left ventricle has no regional wall motion abnormalities. The left ventricular internal cavity size was normal in size. There is  moderate concentric left ventricular hypertrophy. Indeterminate diastolic filling due to E-A fusion. Right Ventricle: The right ventricular size is normal. Right vetricular wall thickness was not well visualized. Right ventricular systolic function is normal. Left Atrium: Left atrial size was mildly dilated. Right  Atrium: Right atrial size was normal in size. Pericardium: Trivial pericardial effusion is present. Mitral Valve: The mitral valve is grossly normal. Trivial mitral valve regurgitation. No evidence of mitral valve stenosis. Tricuspid Valve: The tricuspid valve is grossly normal. Tricuspid valve regurgitation is trivial. No evidence of tricuspid stenosis. Aortic Valve: The aortic valve is grossly normal. There is mild calcification of the aortic valve. There is mild thickening of the aortic valve. Aortic valve regurgitation is mild. Aortic regurgitation PHT measures 540 msec. Aortic valve sclerosis/calcification is present, without any evidence of aortic stenosis. Aortic valve mean gradient measures 3.0 mmHg. Aortic valve peak gradient measures 5.2 mmHg. Aortic valve area, by VTI measures 2.41 cm. Pulmonic Valve: The pulmonic valve was not well visualized. Pulmonic valve regurgitation is mild. No evidence of pulmonic stenosis. Aorta: The aortic root, ascending aorta and aortic arch are all structurally normal, with no evidence of dilitation or obstruction. Venous: The inferior vena cava is normal in size with greater than 50% respiratory variability, suggesting right atrial pressure of 3 mmHg. IAS/Shunts: The interatrial septum was not well visualized.  LEFT VENTRICLE PLAX 2D LVIDd:         4.20 cm   Diastology LVIDs:         3.10 cm   LV e' medial:    5.87 cm/s LV PW:         2.60 cm   LV E/e' medial:  9.6 LV IVS:  1.20 cm   LV e' lateral:   4.57 cm/s LVOT diam:     1.80 cm   LV E/e' lateral: 12.3 LV SV:         67 LV SV Index:   37 LVOT Area:     2.54 cm  RIGHT VENTRICLE             IVC RV S prime:     15.70 cm/s  IVC diam: 1.70 cm TAPSE (M-mode): 3.4 cm LEFT ATRIUM             Index        RIGHT ATRIUM           Index LA Vol (A2C):   52.1 ml 29.03 ml/m  RA Area:     13.50 cm LA Vol (A4C):   56.8 ml 31.65 ml/m  RA Volume:   31.70 ml  17.66 ml/m LA Biplane Vol: 54.5 ml 30.37 ml/m  AORTIC VALVE AV Area  (Vmax):    2.52 cm AV Area (Vmean):   2.43 cm AV Area (VTI):     2.41 cm AV Vmax:           114.00 cm/s AV Vmean:          80.400 cm/s AV VTI:            0.277 m AV Peak Grad:      5.2 mmHg AV Mean Grad:      3.0 mmHg LVOT Vmax:         113.00 cm/s LVOT Vmean:        76.900 cm/s LVOT VTI:          0.262 m LVOT/AV VTI ratio: 0.95 AI PHT:            540 msec  AORTA Ao Root diam: 3.10 cm MITRAL VALVE MV Area (PHT): 5.84 cm     SHUNTS MV Decel Time: 130 msec     Systemic VTI:  0.26 m MV E velocity: 56.10 cm/s   Systemic Diam: 1.80 cm MV A velocity: 109.00 cm/s MV E/A ratio:  0.51 Jodelle Red MD Electronically signed by Jodelle Red MD Signature Date/Time: 10/12/2022/11:04:31 AM    Final    DG Chest Port 1 View  Result Date: 10/11/2022 CLINICAL DATA:  Chest pain EXAM: PORTABLE CHEST 1 VIEW COMPARISON:  04/09/2014 FINDINGS: Cardiomegaly. Aortic atherosclerosis. No focal airspace consolidation, pleural effusion, or pneumothorax. IMPRESSION: Cardiomegaly without acute cardiopulmonary disease. Electronically Signed   By: Duanne Guess D.O.   On: 10/11/2022 12:42     Subjective: Pt says she is feeling well. Pt eager to go home.   Discharge Exam: Vitals:   10/12/22 0725 10/12/22 0730  BP:  (!) 179/83  Pulse: (!) 58   Resp: (!) 21 (!) 22  Temp: 97.8 F (36.6 C) 97.8 F (36.6 C)  SpO2: 95%    Vitals:   10/12/22 0010 10/12/22 0500 10/12/22 0725 10/12/22 0730  BP: (!) 170/70 (!) 169/79  (!) 179/83  Pulse: (!) 56 (!) 57 (!) 58   Resp: 19 18 (!) 21 (!) 22  Temp:  98 F (36.7 C) 97.8 F (36.6 C) 97.8 F (36.6 C)  TempSrc:      SpO2: 97% 94% 95%   Weight:  77.1 kg    Height:        General: Pt is alert, awake, not in acute distress Cardiovascular: normal S1/S2 +, no rubs, no gallops Respiratory: CTA bilaterally, no wheezing, no rhonchi Abdominal:  Soft, NT, ND, bowel sounds + Extremities: no edema, no cyanosis   The results of significant diagnostics from this  hospitalization (including imaging, microbiology, ancillary and laboratory) are listed below for reference.     Microbiology: No results found for this or any previous visit (from the past 240 hour(s)).   Labs: BNP (last 3 results) No results for input(s): "BNP" in the last 8760 hours. Basic Metabolic Panel: Recent Labs  Lab 10/11/22 1237 10/11/22 1758 10/12/22 0035  NA 139  --  137  K 3.3*  --  3.8  CL 95*  --  99  CO2 30  --  31  GLUCOSE 179*  --  112*  BUN 26*  --  27*  CREATININE 1.57* 1.56* 1.56*  CALCIUM 9.6  --  9.3  MG  --  2.2  --    Liver Function Tests: Recent Labs  Lab 10/11/22 1237 10/12/22 0035  AST 23 21  ALT 19 15  ALKPHOS 57 53  BILITOT 0.5 0.6  PROT 6.1* 5.5*  ALBUMIN 3.8 3.4*   No results for input(s): "LIPASE", "AMYLASE" in the last 168 hours. No results for input(s): "AMMONIA" in the last 168 hours. CBC: Recent Labs  Lab 10/11/22 1237 10/11/22 1758 10/12/22 0035  WBC 5.6 5.1 5.5  NEUTROABS 4.4  --   --   HGB 11.4* 11.3* 10.9*  HCT 34.8* 34.7* 33.6*  MCV 91.1 91.8 91.3  PLT 203 200 189   Cardiac Enzymes: No results for input(s): "CKTOTAL", "CKMB", "CKMBINDEX", "TROPONINI" in the last 168 hours. BNP: Invalid input(s): "POCBNP" CBG: No results for input(s): "GLUCAP" in the last 168 hours. D-Dimer No results for input(s): "DDIMER" in the last 72 hours. Hgb A1c No results for input(s): "HGBA1C" in the last 72 hours. Lipid Profile No results for input(s): "CHOL", "HDL", "LDLCALC", "TRIG", "CHOLHDL", "LDLDIRECT" in the last 72 hours. Thyroid function studies No results for input(s): "TSH", "T4TOTAL", "T3FREE", "THYROIDAB" in the last 72 hours.  Invalid input(s): "FREET3" Anemia work up No results for input(s): "VITAMINB12", "FOLATE", "FERRITIN", "TIBC", "IRON", "RETICCTPCT" in the last 72 hours. Urinalysis    Component Value Date/Time   COLORURINE YELLOW 07/01/2021 2106   APPEARANCEUR Clear 10/08/2022 1446   LABSPEC 1.010  07/01/2021 2106   PHURINE 5.0 07/01/2021 2106   GLUCOSEU Negative 10/08/2022 1446   HGBUR MODERATE (A) 07/01/2021 2106   HGBUR negative 08/09/2009 0759   BILIRUBINUR Negative 10/08/2022 1446   KETONESUR negative 07/20/2021 1140   KETONESUR NEGATIVE 07/01/2021 2106   PROTEINUR Negative 10/08/2022 1446   PROTEINUR 100 (A) 07/01/2021 2106   UROBILINOGEN negative (A) 07/31/2021 1521   UROBILINOGEN 1.0 04/26/2010 2340   NITRITE Negative 10/08/2022 1446   NITRITE NEGATIVE 07/01/2021 2106   LEUKOCYTESUR Negative 10/08/2022 1446   LEUKOCYTESUR LARGE (A) 07/01/2021 2106   Sepsis Labs Recent Labs  Lab 10/11/22 1237 10/11/22 1758 10/12/22 0035  WBC 5.6 5.1 5.5   Microbiology No results found for this or any previous visit (from the past 240 hour(s)).  Time coordinating discharge:   SIGNED:  Standley Dakins, MD  [facility] 10/12/2022, 1:01 PM How to contact the [facility] Attending or Consulting provider 7A - 7P or covering provider during after hours 7P -7A, for this patient?  Check the care team in [facility] and look for a) attending/consulting [facility] provider listed and b) the [facility] team listed Log into [email or web address] and use [facility] universal password to access. If you do not have the password, please contact the hospital operator.  Locate the [facility] provider you are looking for under [facility] and page to a number that you can be directly reached. If you still have difficulty reaching the provider, please page the [facility] (Director on Call) for the Hospitalists listed on amion for assistance.

## 2022-10-14 ENCOUNTER — Encounter: Payer: Self-pay | Admitting: Urology

## 2022-10-14 NOTE — Patient Instructions (Signed)
Urinary Tract Infection, Adult  A urinary tract infection (UTI) is an infection of any part of the urinary tract. The urinary tract includes the kidneys, ureters, bladder, and urethra. These organs make, store, and get rid of urine in the body. An upper UTI affects the ureters and kidneys. A lower UTI affects the bladder and urethra. What are the causes? Most urinary tract infections are caused by bacteria in your genital area around your urethra, where urine leaves your body. These bacteria grow and cause inflammation of your urinary tract. What increases the risk? You are more likely to develop this condition if: You have a urinary catheter that stays in place. You are not able to control when you urinate or have a bowel movement (incontinence). You are female and you: Use a spermicide or diaphragm for birth control. Have low estrogen levels. Are pregnant. You have certain genes that increase your risk. You are sexually active. You take antibiotic medicines. You have a condition that causes your flow of urine to slow down, such as: An enlarged prostate, if you are female. Blockage in your urethra. A kidney stone. A nerve condition that affects your bladder control (neurogenic bladder). Not getting enough to drink, or not urinating often. You have certain medical conditions, such as: Diabetes. A weak disease-fighting system (immunesystem). Sickle cell disease. Gout. Spinal cord injury. What are the signs or symptoms? Symptoms of this condition include: Needing to urinate right away (urgency). Frequent urination. This may include small amounts of urine each time you urinate. Pain or burning with urination. Blood in the urine. Urine that smells bad or unusual. Trouble urinating. Cloudy urine. Vaginal discharge, if you are female. Pain in the abdomen or the lower back. You may also have: Vomiting or a decreased appetite. Confusion. Irritability or tiredness. A fever or  chills. Diarrhea. The first symptom in older adults may be confusion. In some cases, they may not have any symptoms until the infection has worsened. How is this diagnosed? This condition is diagnosed based on your medical history and a physical exam. You may also have other tests, including: Urine tests. Blood tests. Tests for STIs (sexually transmitted infections). If you have had more than one UTI, a cystoscopy or imaging studies may be done to determine the cause of the infections. How is this treated? Treatment for this condition includes: Antibiotic medicine. Over-the-counter medicines to treat discomfort. Drinking enough water to stay hydrated. If you have frequent infections or have other conditions such as a kidney stone, you may need to see a health care provider who specializes in the urinary tract (urologist). In rare cases, urinary tract infections can cause sepsis. Sepsis is a life-threatening condition that occurs when the body responds to an infection. Sepsis is treated in the hospital with IV antibiotics, fluids, and other medicines. Follow these instructions at home:  Medicines Take over-the-counter and prescription medicines only as told by your health care provider. If you were prescribed an antibiotic medicine, take it as told by your health care provider. Do not stop using the antibiotic even if you start to feel better. General instructions Make sure you: Empty your bladder often and completely. Do not hold urine for long periods of time. Empty your bladder after sex. Wipe from front to back after urinating or having a bowel movement if you are female. Use each tissue only one time when you wipe. Drink enough fluid to keep your urine pale yellow. Keep all follow-up visits. This is important. Contact a health   care provider if: Your symptoms do not get better after 1-2 days. Your symptoms go away and then return. Get help right away if: You have severe pain in  your back or your lower abdomen. You have a fever or chills. You have nausea or vomiting. Summary A urinary tract infection (UTI) is an infection of any part of the urinary tract, which includes the kidneys, ureters, bladder, and urethra. Most urinary tract infections are caused by bacteria in your genital area. Treatment for this condition often includes antibiotic medicines. If you were prescribed an antibiotic medicine, take it as told by your health care provider. Do not stop using the antibiotic even if you start to feel better. Keep all follow-up visits. This is important. This information is not intended to replace advice given to you by your health care provider. Make sure you discuss any questions you have with your health care provider. Document Revised: 11/13/2019 Document Reviewed: 11/18/2019 Elsevier Patient Education  2024 [employer]  

## 2022-10-16 ENCOUNTER — Ambulatory Visit (INDEPENDENT_AMBULATORY_CARE_PROVIDER_SITE_OTHER): Payer: Medicare HMO | Admitting: Adult Health

## 2022-10-16 ENCOUNTER — Encounter: Payer: Self-pay | Admitting: Adult Health

## 2022-10-16 VITALS — BP 130/86 | HR 60 | Temp 97.8°F | Ht 62.5 in | Wt 169.0 lb

## 2022-10-16 DIAGNOSIS — I471 Supraventricular tachycardia, unspecified: Secondary | ICD-10-CM | POA: Diagnosis not present

## 2022-10-16 DIAGNOSIS — I10 Essential (primary) hypertension: Secondary | ICD-10-CM | POA: Diagnosis not present

## 2022-10-16 DIAGNOSIS — E876 Hypokalemia: Secondary | ICD-10-CM

## 2022-10-16 DIAGNOSIS — N1832 Chronic kidney disease, stage 3b: Secondary | ICD-10-CM | POA: Diagnosis not present

## 2022-10-16 DIAGNOSIS — R7989 Other specified abnormal findings of blood chemistry: Secondary | ICD-10-CM

## 2022-10-16 DIAGNOSIS — E039 Hypothyroidism, unspecified: Secondary | ICD-10-CM

## 2022-10-16 LAB — BASIC METABOLIC PANEL
BUN: 37 mg/dL — ABNORMAL HIGH (ref 6–23)
CO2: 34 mEq/L — ABNORMAL HIGH (ref 19–32)
Calcium: 10.3 mg/dL (ref 8.4–10.5)
Chloride: 95 mEq/L — ABNORMAL LOW (ref 96–112)
Creatinine, Ser: 1.95 mg/dL — ABNORMAL HIGH (ref 0.40–1.20)
GFR: 24.06 mL/min — ABNORMAL LOW (ref >60.00)
Glucose, Bld: 129 mg/dL — ABNORMAL HIGH (ref 70–99)
Potassium: 4.1 mEq/L (ref 3.5–5.1)
Sodium: 138 mEq/L (ref 135–145)
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
## 2022-10-22 ENCOUNTER — Telehealth: Payer: Self-pay | Admitting: Adult Health

## 2022-10-22 ENCOUNTER — Encounter: Payer: Self-pay | Admitting: *Deleted

## 2022-10-22 ENCOUNTER — Encounter: Payer: Self-pay | Admitting: Adult Health

## 2022-10-22 ENCOUNTER — Ambulatory Visit: Payer: Medicare HMO | Attending: Internal Medicine | Admitting: Internal Medicine

## 2022-10-22 VITALS — BP 144/98 | HR 69 | Ht 62.0 in | Wt 169.2 lb

## 2022-10-22 DIAGNOSIS — Z01812 Encounter for preprocedural laboratory examination: Secondary | ICD-10-CM

## 2022-10-22 DIAGNOSIS — I471 Supraventricular tachycardia, unspecified: Secondary | ICD-10-CM | POA: Diagnosis not present
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
## 2022-10-22 NOTE — Patient Instructions (Signed)
Medication Instructions:   Continue all current medications.      Labwork:  BMET, CBC - orders given   Testing/Procedures:  Your physician has recommended that you have an ablation. Catheter ablation is a medical procedure used to treat some cardiac arrhythmias (irregular heartbeats). During catheter ablation, a long, thin, flexible tube is put into a blood vessel in your groin (upper thigh), or neck. This tube is called an ablation catheter. It is then guided to your heart through the blood vessel. Radio frequency waves destroy small areas of heart tissue where abnormal heartbeats may cause an arrhythmia to start. Please see the instruction sheet given to you today.   Follow-Up:  Given at time of discharge   Any Other Special Instructions Will Be Listed Below (If Applicable).   If you need a refill on your cardiac medications before your next appointment, please call your pharmacy.  

## 2022-10-22 NOTE — Telephone Encounter (Signed)
Patient notified of update  and verbalized understanding. 

## 2022-10-22 NOTE — Telephone Encounter (Signed)
Pt is returning kendra call concerning blood work results 
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
## 2022-10-24 ENCOUNTER — Telehealth: Payer: Self-pay | Admitting: *Deleted

## 2022-10-24 NOTE — Telephone Encounter (Signed)
Checking precert for :  SVT ablation 11/10/22 [facility]

## 2022-10-27 DIAGNOSIS — I471 Supraventricular tachycardia, unspecified: Secondary | ICD-10-CM | POA: Diagnosis not present

## 2022-10-27 DIAGNOSIS — Z01812 Encounter for preprocedural laboratory examination: Secondary | ICD-10-CM | POA: Diagnosis not present

## 2022-10-28 LAB — BASIC METABOLIC PANEL
BUN/Creatinine Ratio: 20 (ref 12–28)
BUN: 34 mg/dL — ABNORMAL HIGH (ref 8–27)
CO2: 26 mmol/L (ref 20–29)
Calcium: 9.4 mg/dL (ref 8.7–10.3)
Chloride: 93 mmol/L — ABNORMAL LOW (ref 96–106)
Creatinine, Ser: 1.71 mg/dL — ABNORMAL HIGH (ref 0.57–1.00)
Glucose: 167 mg/dL — ABNORMAL HIGH (ref 70–99)
Potassium: 4.1 mmol/L (ref 3.5–5.2)
Sodium: 135 mmol/L (ref 134–144)
eGFR: 30 mL/min/{1.73_m2} — ABNORMAL LOW (ref >59)

## 2022-10-28 LAB — CBC
Hematocrit: 35 % (ref 34.0–46.6)
Hemoglobin: 11.3 g/dL (ref 11.1–15.9)
MCH: 29.5 pg (ref 26.6–33.0)
MCHC: 32.3 g/dL (ref 31.5–35.7)
MCV: 91 fL (ref 79–97)
Platelets: 198 10*3/uL (ref 150–450)
RBC: 3.83 x10E6/uL (ref 3.77–5.28)
RDW: 12.6 % (ref 11.7–15.4)
WBC: 4.6 10*3/uL (ref 3.4–10.8)

## 2022-10-30 ENCOUNTER — Ambulatory Visit
Admission: RE | Admit: 2022-10-30 | Discharge: 2022-10-30 | Disposition: A | Discharge status: Home / Self Care | Payer: Medicare HMO | Source: Ambulatory Visit | Attending: Obstetrics & Gynecology | Admitting: Obstetrics & Gynecology

## 2022-10-30 DIAGNOSIS — Z0000 Encounter for general adult medical examination without abnormal findings: Secondary | ICD-10-CM

## 2022-10-30 DIAGNOSIS — Z1231 Encounter for screening mammogram for malignant neoplasm of breast: Secondary | ICD-10-CM | POA: Diagnosis not present

## 2022-11-04 ENCOUNTER — Encounter: Payer: Self-pay | Admitting: Internal Medicine

## 2022-11-09 ENCOUNTER — Emergency Department (HOSPITAL_COMMUNITY): Payer: Medicare HMO

## 2022-11-09 ENCOUNTER — Other Ambulatory Visit: Payer: Self-pay

## 2022-11-09 ENCOUNTER — Ambulatory Visit (HOSPITAL_COMMUNITY)
Admission: EM | Admit: 2022-11-09 | Discharge: 2022-11-10 | Disposition: A | Discharge status: Home / Self Care | Payer: Medicare HMO | Source: Home / Self Care | Attending: Emergency Medicine | Admitting: Emergency Medicine

## 2022-11-09 ENCOUNTER — Encounter (HOSPITAL_COMMUNITY): Payer: Self-pay

## 2022-11-09 DIAGNOSIS — R002 Palpitations: Secondary | ICD-10-CM

## 2022-11-09 DIAGNOSIS — I471 Supraventricular tachycardia, unspecified: Secondary | ICD-10-CM | POA: Insufficient documentation

## 2022-11-09 DIAGNOSIS — D71 Functional disorders of polymorphonuclear neutrophils: Secondary | ICD-10-CM | POA: Diagnosis not present

## 2022-11-09 DIAGNOSIS — I4891 Unspecified atrial fibrillation: Secondary | ICD-10-CM

## 2022-11-09 DIAGNOSIS — I700 Atherosclerosis of aorta: Secondary | ICD-10-CM | POA: Diagnosis not present

## 2022-11-09 DIAGNOSIS — I517 Cardiomegaly: Secondary | ICD-10-CM | POA: Diagnosis not present

## 2022-11-09 DIAGNOSIS — R000 Tachycardia, unspecified: Secondary | ICD-10-CM | POA: Diagnosis not present

## 2022-11-09 LAB — BASIC METABOLIC PANEL
Anion gap: 15 (ref 5–15)
BUN: 26 mg/dL — ABNORMAL HIGH (ref 8–23)
CO2: 27 mmol/L (ref 22–32)
Calcium: 9.4 mg/dL (ref 8.9–10.3)
Chloride: 94 mmol/L — ABNORMAL LOW (ref 98–111)
Creatinine, Ser: 1.52 mg/dL — ABNORMAL HIGH (ref 0.44–1.00)
GFR, Estimated: 35 mL/min — ABNORMAL LOW (ref >60)
Glucose, Bld: 103 mg/dL — ABNORMAL HIGH (ref 70–99)
Potassium: 3.5 mmol/L (ref 3.5–5.1)
Sodium: 136 mmol/L (ref 135–145)

## 2022-11-09 LAB — CBC
HCT: 35.6 % — ABNORMAL LOW (ref 36.0–46.0)
Hemoglobin: 11.8 g/dL — ABNORMAL LOW (ref 12.0–15.0)
MCH: 31.3 pg (ref 26.0–34.0)
MCHC: 33.1 g/dL (ref 30.0–36.0)
MCV: 94.4 fL (ref 80.0–100.0)
Platelets: 201 10*3/uL (ref 150–400)
RBC: 3.77 MIL/uL — ABNORMAL LOW (ref 3.87–5.11)
RDW: 12.7 % (ref 11.5–15.5)
WBC: 5.7 10*3/uL (ref 4.0–10.5)
nRBC: 0 % (ref 0.0–0.2)

## 2022-11-09 LAB — TROPONIN I (HIGH SENSITIVITY)
Troponin I (High Sensitivity): 30 ng/L — ABNORMAL HIGH (ref <18)
Troponin I (High Sensitivity): 57 ng/L — ABNORMAL HIGH (ref <18)

## 2022-11-09 NOTE — ED Triage Notes (Signed)
Pt c/o heart palpitations and pt states her Sp02 showed her heart rate went up to 210. Pt states she is scheduled to get a cardiac ablation tomorrow. Pt denies chest pain or SOB.

## 2022-11-09 NOTE — Pre-Procedure Instructions (Signed)
Instructed patient on the following items: Arrival time 0515 Nothing to eat or drink after midnight No meds AM of procedure Responsible person to drive you home and stay with you for 24 hrs  Last dose of Toprol-7/18
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
## 2022-11-10 ENCOUNTER — Ambulatory Visit (HOSPITAL_COMMUNITY): Admission: RE | Admit: 2022-11-10 | Payer: Medicare HMO | Source: Ambulatory Visit | Admitting: Internal Medicine

## 2022-11-10 ENCOUNTER — Ambulatory Visit (HOSPITAL_COMMUNITY)
Admission: EM | Disposition: A | Discharge status: Home / Self Care | Payer: Self-pay | Source: Home / Self Care | Attending: Emergency Medicine

## 2022-11-10 DIAGNOSIS — I471 Supraventricular tachycardia, unspecified: Secondary | ICD-10-CM

## 2022-11-10 HISTORY — PX: SVT ABLATION: EP1225

## 2022-11-10 LAB — BASIC METABOLIC PANEL
Anion gap: 10 (ref 5–15)
BUN: 25 mg/dL — ABNORMAL HIGH (ref 8–23)
CO2: 22 mmol/L (ref 22–32)
Calcium: 7.7 mg/dL — ABNORMAL LOW (ref 8.9–10.3)
Chloride: 105 mmol/L (ref 98–111)
Creatinine, Ser: 1.31 mg/dL — ABNORMAL HIGH (ref 0.44–1.00)
GFR, Estimated: 41 mL/min — ABNORMAL LOW (ref >60)
Glucose, Bld: 99 mg/dL (ref 70–99)
Potassium: 3.3 mmol/L — ABNORMAL LOW (ref 3.5–5.1)
Sodium: 137 mmol/L (ref 135–145)

## 2022-11-10 LAB — TROPONIN I (HIGH SENSITIVITY): Troponin I (High Sensitivity): 51 ng/L — ABNORMAL HIGH (ref <18)

## 2022-11-10 SURGERY — SVT ABLATION

## 2022-11-10 MED ORDER — SODIUM CHLORIDE 0.9% FLUSH: 3.0000 mL | INTRAVENOUS | Status: DC | PRN

## 2022-11-10 MED ORDER — SODIUM CHLORIDE 0.9% FLUSH: 3.0000 mL | Freq: Two times a day (BID) | INTRAVENOUS | Status: DC

## 2022-11-10 MED ORDER — BUPIVACAINE HCL (PF) 0.25 % IJ SOLN
INTRAMUSCULAR | Status: AC
  Filled 2022-11-10: qty 30

## 2022-11-10 MED ORDER — FENTANYL CITRATE (PF) 100 MCG/2ML IJ SOLN
INTRAMUSCULAR | Status: DC | PRN
  Administered 2022-11-10: 25 ug via INTRAVENOUS

## 2022-11-10 MED ORDER — ADENOSINE 6 MG/2ML IV SOLN
INTRAVENOUS | Status: AC
  Administered 2022-11-10: 6 mg
  Filled 2022-11-10: qty 2

## 2022-11-10 MED ORDER — ONDANSETRON HCL 4 MG/2ML IJ SOLN: 4.0000 mg | Freq: Four times a day (QID) | INTRAMUSCULAR | Status: DC | PRN

## 2022-11-10 MED ORDER — HEPARIN (PORCINE) IN NACL 1000-0.9 UT/500ML-% IV SOLN
INTRAVENOUS | Status: DC | PRN
  Administered 2022-11-10: 500 mL

## 2022-11-10 MED ORDER — FENTANYL CITRATE (PF) 100 MCG/2ML IJ SOLN
INTRAMUSCULAR | Status: AC
  Filled 2022-11-10: qty 2

## 2022-11-10 MED ORDER — MIDAZOLAM HCL 5 MG/5ML IJ SOLN
INTRAMUSCULAR | Status: DC | PRN
  Administered 2022-11-10: 2 mg via INTRAVENOUS

## 2022-11-10 MED ORDER — HEPARIN SODIUM (PORCINE) 1000 UNIT/ML IJ SOLN: INTRAMUSCULAR | Status: DC | PRN

## 2022-11-10 MED ORDER — BUPIVACAINE HCL (PF) 0.25 % IJ SOLN
INTRAMUSCULAR | Status: DC | PRN
  Administered 2022-11-10: 30 mL

## 2022-11-10 MED ORDER — MIDAZOLAM HCL 5 MG/5ML IJ SOLN
INTRAMUSCULAR | Status: AC
  Filled 2022-11-10: qty 5

## 2022-11-10 MED ORDER — SODIUM CHLORIDE 0.9 % IV SOLN: 250.0000 mL | INTRAVENOUS | Status: DC | PRN

## 2022-11-10 MED ORDER — HEPARIN SODIUM (PORCINE) 1000 UNIT/ML IJ SOLN
INTRAMUSCULAR | Status: AC
  Filled 2022-11-10: qty 10

## 2022-11-10 MED ORDER — DILTIAZEM HCL-DEXTROSE 125-5 MG/125ML-% IV SOLN (PREMIX)
5.0000 mg/h | INTRAVENOUS | Status: DC
  Administered 2022-11-10: 5 mg/h via INTRAVENOUS
  Filled 2022-11-10: qty 125

## 2022-11-10 MED ORDER — ACETAMINOPHEN 325 MG PO TABS: 650.0000 mg | ORAL_TABLET | ORAL | Status: DC | PRN

## 2022-11-10 MED ORDER — SODIUM CHLORIDE 0.9 % IV SOLN: INTRAVENOUS | Status: DC

## 2022-11-10 SURGICAL SUPPLY — 11 items
BAG SNAP BAND KOVER 36X36 (MISCELLANEOUS) IMPLANT
CATH EZ STEER NAV 4MM D-F CUR (ABLATOR) IMPLANT
CATH JOSEPH QUAD ALLRED 6F REP (CATHETERS) IMPLANT
CATH POLARIS X REPROCESSED (CATHETERS) IMPLANT
PACK EP LATEX FREE (CUSTOM PROCEDURE TRAY) ×1
PACK EP LF (CUSTOM PROCEDURE TRAY) ×1 IMPLANT
PAD DEFIB RADIO PHYSIO CONN (PAD) ×1 IMPLANT
PATCH CARTO3 (PAD) IMPLANT
SHEATH PINNACLE 6F 10CM (SHEATH) IMPLANT
SHEATH PINNACLE 7F 10CM (SHEATH) IMPLANT
SHEATH PINNACLE 8F 10CM (SHEATH) IMPLANT

## 2022-11-10 NOTE — ED Notes (Signed)
Pt returned to sinus rhythm EKG captured.

## 2022-11-10 NOTE — ED Provider Notes (Signed)
  Physical Exam  BP (!) 157/79   Pulse (!) 140   Temp 97.9 F (36.6 C)   Resp 16   Ht 5\' 2"  (1.575 m)   Wt 76.7 kg   SpO2 92%   BMI 30.93 kg/m   Physical Exam  Procedures  .Critical Care  Performed by: Darrick Grinder, PA-C Authorized by: Darrick Grinder, PA-C   Critical care provider statement:    Critical care time (minutes):  30   Critical care was necessary to treat or prevent imminent or life-threatening deterioration of the following conditions:  Cardiac failure   Critical care was time spent personally by me on the following activities:  Development of treatment plan with patient or surrogate, discussions with consultants, evaluation of patient's response to treatment, examination of patient, ordering and review of laboratory studies, ordering and review of radiographic studies, ordering and performing treatments and interventions, pulse oximetry, re-evaluation of patient's condition and review of old charts   ED Course / MDM   Clinical Course as of 11/10/22 0603  Wynelle Link Nov 09, 2022  2206 Hemoglobin(!): 11.8 No significant anemia to explain symptoms [BB]  2207 Creatinine(!): 1.52 Improved from baseline [BB]  2207 Troponin I (High Sensitivity)(!) Elevated but improved from prior [BB]    Clinical Course User Index [BB] Fayrene Helper, MD   Medical Decision Making Amount and/or Complexity of Data Reviewed Labs: ordered. Decision-making details documented in ED Course. Radiology: ordered.   Patient care assumed from Dr. Willeen Cass at shift handoff.  Patient with apparent episode of SVT at home after holding metoprolol for several days in preparation for ablation scheduled for Monday morning.  SVT resolved without any intervention.  Patient signed out to me after cardiology was consulted with plans to see patient in person.  Disposition pending cardiology recommendations.  Cardiology saw the patient at bedside.  Patient is scheduled for first ablation appointment this  morning he was supposed to be at the hospital by 630.  Cardiology stated they could admit the patient upstairs or allow the patient to board in the ED until the procedure but they had no further recommendations for care as patient was in sinus rhythm with a normal rate and stable blood pressure.  Patient was kept in the ED.  Later in the morning it was noted the patient was back in SVT with a rate between 190 and 200.  The patient was treated with vagal maneuvers which were unsuccessful.  The patient was then treated with 6 mg of adenosine followed by 12 mg of adenosine.  Patient heart rate improved to 150 but remained in atrial fibrillation with RVR.  I reconsulted cardiology who agreed with plan to start patient on Cardizem drip.  Cardizem titrated to a rate of the 130s.  Cardiology agrees with plan to discharge at 630 for patient to be transferred upstairs to EP lab for scheduled ablation.  Patient had 1 further episode of SVT with a rate of 220 which resolved without further intervention with a Cardizem drip and patient's rate returned to the 130s 140s.  Plan to discharge at this time so patient may go for her scheduled ablation for definitive management.       Pamala Duffel 11/10/22 4098    Glynn Octave, MD 11/10/22 [phone]

## 2022-11-10 NOTE — Discharge Instructions (Addendum)

## 2022-11-10 NOTE — Progress Notes (Signed)
Site area- right groin venous sheaths 5F, 45F, 54F  Site Prior to Removal- level 0   Pressure Applied For-  15 MInutes   Bedrest Beginning at - 0945 am   Manual- Yes   Patient Status During Pull- Stable    Post Pull Groin Site- Level 0   Post Pull Instructions Given- Yes   Post Pull Pulses Present- palpable DP pulse right foot    Dressing Applied- Tegaderm and Gauze Dressing applied   Comments:  tolerated well

## 2022-11-10 NOTE — ED Notes (Signed)
Patient is waiting for cardiologist consult before going up to EP per Cath Lab RN.  She is now in sinus rhythm. Per the ER doctor we will hold off on the diltiazem drip for now.

## 2022-11-10 NOTE — Interval H&P Note (Signed)
History and Physical Interval Note:  11/10/2022 7:25 AM  Jasmine Wheeler  has presented today for surgery, with the diagnosis of svt.  The various methods of treatment have been discussed with the patient and family. After consideration of risks, benefits and other options for treatment, the patient has consented to  Procedure(s): SVT ABLATION (N/A) as a surgical intervention.  The patient's history has been reviewed, patient examined, no change in status, stable for surgery.  I have reviewed the patient's chart and labs.  Questions were answered to the patient's satisfaction.  Of note the patient presented to the ED last night having experienced SVT at 200/min but this stopped spontaneously.    Lewayne Bunting

## 2022-11-10 NOTE — ED Notes (Signed)
Pt went into SVT. MD and PA came. Patient tried vagal maneuvers but it did not work. Patient given 6mg  of adenosine. 12 mg given and then went into A-fib. Waiting on cardiology.

## 2022-11-10 NOTE — ED Notes (Signed)
Patient went into SVT again trying to use the bedpan. Laid her back and she converted out of it. Purewick put in place.

## 2022-11-11 ENCOUNTER — Encounter (HOSPITAL_COMMUNITY): Payer: Self-pay | Admitting: Internal Medicine

## 2022-11-11 MED FILL — Heparin Sodium (Porcine) Inj 1000 Unit/ML: INTRAMUSCULAR | Qty: 10 | Status: AC

## 2022-11-13 ENCOUNTER — Encounter: Payer: Self-pay | Admitting: Adult Health

## 2022-11-13 NOTE — Telephone Encounter (Signed)
Please advise 

## 2022-11-19 ENCOUNTER — Encounter (INDEPENDENT_AMBULATORY_CARE_PROVIDER_SITE_OTHER): Payer: Self-pay

## 2022-11-20 ENCOUNTER — Telehealth: Payer: Self-pay | Admitting: Cardiovascular Disease

## 2022-11-20 DIAGNOSIS — I7121 Aneurysm of the ascending aorta, without rupture: Secondary | ICD-10-CM

## 2022-11-20 NOTE — Telephone Encounter (Signed)
Patient will need orders for echocardiogram prior to 12/24 visit.

## 2022-11-20 NOTE — Telephone Encounter (Signed)
Called pt to let her know she needs an echo done before her appt in December and to be expecting a call to get it scheduled.

## 2022-11-22 ENCOUNTER — Encounter: Payer: Self-pay | Admitting: Cardiovascular Disease

## 2022-11-22 ENCOUNTER — Other Ambulatory Visit: Payer: Self-pay | Admitting: Adult Health

## 2022-11-22 ENCOUNTER — Other Ambulatory Visit: Payer: Self-pay | Admitting: Cardiovascular Disease

## 2022-11-22 DIAGNOSIS — I10 Essential (primary) hypertension: Secondary | ICD-10-CM

## 2022-11-24 MED ORDER — SPIRONOLACTONE-HCTZ 25-25 MG PO TABS
1.0000 | ORAL_TABLET | Freq: Every day | ORAL | 3 refills | Status: DC
Start: 2022-11-24 — End: 2023-07-08

## 2022-11-24 NOTE — Addendum Note (Signed)
Addended by: Serita Sheller R on: 11/24/2022 10:12 AM   Modules accepted: Orders

## 2022-12-17 ENCOUNTER — Ambulatory Visit (INDEPENDENT_AMBULATORY_CARE_PROVIDER_SITE_OTHER): Payer: Medicare HMO

## 2022-12-17 VITALS — Ht 62.5 in | Wt 166.0 lb

## 2022-12-17 DIAGNOSIS — Z0000 Encounter for general adult medical examination without abnormal findings: Secondary | ICD-10-CM | POA: Diagnosis not present

## 2022-12-17 NOTE — Patient Instructions (Addendum)
[name] , Thank you for taking time to come for your Medicare Wellness Visit. I appreciate your ongoing commitment to your health goals. Please review the following plan we discussed and let me know if I can assist you in the future.   Referrals/Orders/Follow-Ups/Clinician Recommendations:   This is a list of the screening recommended for you and due dates:  Health Maintenance  Topic Date Due   COVID-19 Vaccine (1 - 2023-24 season) Never done   Flu Shot  11/20/2022   Mammogram  10/30/2023   Medicare Annual Wellness Visit  12/17/2023   DTaP/Tdap/Td vaccine (4 - Tdap) 04/09/2030   Pneumonia Vaccine  Completed   DEXA scan (bone density measurement)  Completed   Hepatitis C Screening  Completed   Zoster (Shingles) Vaccine  Completed   HPV Vaccine  Aged Out   Colon Cancer Screening  Discontinued    Advanced directives: (Declined) Advance directive discussed with you today. Even though you declined this today, please call our office should you change your mind, and we can give you the proper paperwork for you to fill out.  Next Medicare Annual Wellness Visit scheduled for next year: Yes
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
## 2022-12-18 ENCOUNTER — Ambulatory Visit: Payer: Medicare HMO | Attending: Internal Medicine | Admitting: Internal Medicine

## 2022-12-18 ENCOUNTER — Encounter: Payer: Self-pay | Admitting: Internal Medicine

## 2022-12-18 VITALS — BP 144/94 | HR 57 | Ht 62.5 in | Wt 158.4 lb

## 2022-12-18 DIAGNOSIS — I471 Supraventricular tachycardia, unspecified: Secondary | ICD-10-CM | POA: Diagnosis not present

## 2022-12-18 DIAGNOSIS — I10 Essential (primary) hypertension: Secondary | ICD-10-CM | POA: Diagnosis not present

## 2022-12-18 DIAGNOSIS — I351 Nonrheumatic aortic (valve) insufficiency: Secondary | ICD-10-CM | POA: Diagnosis not present

## 2022-12-18 DIAGNOSIS — I453 Trifascicular block: Secondary | ICD-10-CM | POA: Diagnosis not present

## 2022-12-18 DIAGNOSIS — K648 Other hemorrhoids: Secondary | ICD-10-CM
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
## 2022-12-18 NOTE — Patient Instructions (Signed)
Medication Instructions:  Your physician recommends that you continue on your current medications as directed. Please refer to the Current Medication list given to you today.  *If you need a refill on your cardiac medications before your next appointment, please call your pharmacy*   Lab Work: NONE   If you have labs (blood work) drawn today and your tests are completely normal, you will receive your results only by: MyChart Message (if you have MyChart) OR A paper copy in the mail If you have any lab test that is abnormal or we need to change your treatment, we will call you to review the results.   Testing/Procedures: NONE    Follow-Up: At [facility], you and your health needs are our priority.  As part of our continuing mission to provide you with exceptional heart care, we have created designated Provider Care Teams.  These Care Teams include your primary Cardiologist (physician) and Advanced Practice Providers (APPs -  Physician Assistants and Nurse Practitioners) who all work together to provide you with the care you need, when you need it.  We recommend signing up for the patient portal called "MyChart".  Sign up information is provided on this After Visit Summary.  MyChart is used to connect with patients for Virtual Visits (Telemedicine).  Patients are able to view lab/test results, encounter notes, upcoming appointments, etc.  Non-urgent messages can be sent to your provider as well.   To learn more about what you can do with MyChart, go to [email or web address].    Your next appointment:    As Needed   Provider:   Gregg Taylor, MD    Other Instructions Thank you for choosing [facility]!    

## 2022-12-23 ENCOUNTER — Encounter: Payer: Self-pay | Admitting: Adult Health

## 2022-12-23 NOTE — Telephone Encounter (Signed)
Please advise 

## 2022-12-25 ENCOUNTER — Ambulatory Visit (INDEPENDENT_AMBULATORY_CARE_PROVIDER_SITE_OTHER): Payer: Medicare HMO

## 2022-12-25 DIAGNOSIS — Z23 Encounter for immunization: Secondary | ICD-10-CM

## 2023-01-05 DIAGNOSIS — N1832 Chronic kidney disease, stage 3b: Secondary | ICD-10-CM | POA: Diagnosis not present

## 2023-01-05 DIAGNOSIS — N289 Disorder of kidney and ureter, unspecified: Secondary | ICD-10-CM | POA: Diagnosis not present

## 2023-01-05 DIAGNOSIS — N189 Chronic kidney disease, unspecified: Secondary | ICD-10-CM | POA: Diagnosis not present

## 2023-01-05 DIAGNOSIS — I129 Hypertensive chronic kidney disease with stage 1 through stage 4 chronic kidney disease, or unspecified chronic kidney disease: Secondary | ICD-10-CM | POA: Diagnosis not present

## 2023-01-05 DIAGNOSIS — D631 Anemia in chronic kidney disease: Secondary | ICD-10-CM | POA: Diagnosis not present

## 2023-01-06 LAB — LAB REPORT - SCANNED
Albumin, Urine POC: 11.4
Albumin/Creatinine Ratio, Urine, POC: 16
Creatinine, POC: 72.2 mg/dL
EGFR: 35

## 2023-01-08 DIAGNOSIS — Z85820 Personal history of malignant melanoma of skin: Secondary | ICD-10-CM | POA: Diagnosis not present

## 2023-01-08 DIAGNOSIS — L821 Other seborrheic keratosis: Secondary | ICD-10-CM | POA: Diagnosis not present

## 2023-01-08 DIAGNOSIS — D225 Melanocytic nevi of trunk: Secondary | ICD-10-CM | POA: Diagnosis not present

## 2023-01-08 DIAGNOSIS — D2261 Melanocytic nevi of right upper limb, including shoulder: Secondary | ICD-10-CM | POA: Diagnosis not present

## 2023-01-08 DIAGNOSIS — D2262 Melanocytic nevi of left upper limb, including shoulder: Secondary | ICD-10-CM | POA: Diagnosis not present

## 2023-01-08 DIAGNOSIS — D2272 Melanocytic nevi of left lower limb, including hip: Secondary | ICD-10-CM | POA: Diagnosis not present

## 2023-01-08 DIAGNOSIS — L720 Epidermal cyst: Secondary | ICD-10-CM | POA: Diagnosis not present

## 2023-01-26 ENCOUNTER — Other Ambulatory Visit: Payer: Self-pay | Admitting: Adult Health

## 2023-02-05 ENCOUNTER — Other Ambulatory Visit: Payer: Self-pay | Admitting: Internal Medicine

## 2023-02-16 DIAGNOSIS — H04123 Dry eye syndrome of bilateral lacrimal glands: Secondary | ICD-10-CM | POA: Diagnosis not present

## 2023-02-27 ENCOUNTER — Encounter: Payer: Self-pay | Admitting: Adult Health

## 2023-02-27 NOTE — Telephone Encounter (Signed)
Called pt but she stated that the message was intended for her heart doctor. Message routed to Dr. Thurmon Fair, MD

## 2023-03-02 NOTE — Telephone Encounter (Signed)
Spoke with patient. Just wanted to make sure all she needed to do was call her pharmacy for a refill of Repatha. Advised for her to call them when she needs refill. Her grant is good till march. She can call us at that time to renew

## 2023-03-30 ENCOUNTER — Encounter: Payer: Self-pay | Admitting: Cardiovascular Disease

## 2023-03-30 MED ORDER — REPATHA SURECLICK 140 MG/ML ~~LOC~~ SOAJ: 140.0000 mg | SUBCUTANEOUS | 3 refills | Status: DC

## 2023-04-07 ENCOUNTER — Encounter: Payer: Self-pay | Admitting: Cardiovascular Disease

## 2023-04-08 ENCOUNTER — Other Ambulatory Visit: Payer: Self-pay | Admitting: Internal Medicine

## 2023-04-10 ENCOUNTER — Encounter (HOSPITAL_COMMUNITY): Payer: Self-pay

## 2023-04-10 ENCOUNTER — Ambulatory Visit (HOSPITAL_COMMUNITY): Payer: Medicare HMO

## 2023-04-14 ENCOUNTER — Ambulatory Visit: Payer: Medicare HMO | Attending: Cardiovascular Disease | Admitting: Cardiovascular Disease

## 2023-04-14 ENCOUNTER — Encounter: Payer: Self-pay | Admitting: Cardiovascular Disease

## 2023-04-14 VITALS — BP 118/82 | HR 56 | Ht 62.5 in | Wt 169.0 lb

## 2023-04-14 DIAGNOSIS — N1831 Chronic kidney disease, stage 3a: Secondary | ICD-10-CM

## 2023-04-14 DIAGNOSIS — I453 Trifascicular block: Secondary | ICD-10-CM

## 2023-04-14 DIAGNOSIS — I10 Essential (primary) hypertension: Secondary | ICD-10-CM | POA: Diagnosis not present

## 2023-04-14 DIAGNOSIS — I351 Nonrheumatic aortic (valve) insufficiency: Secondary | ICD-10-CM | POA: Diagnosis not present

## 2023-04-14 DIAGNOSIS — I471 Supraventricular tachycardia, unspecified: Secondary | ICD-10-CM | POA: Diagnosis not present

## 2023-04-14 DIAGNOSIS — I700 Atherosclerosis of aorta: Secondary | ICD-10-CM | POA: Diagnosis not present

## 2023-04-14 DIAGNOSIS — I7121 Aneurysm of the ascending aorta, without rupture: Secondary | ICD-10-CM | POA: Diagnosis not present

## 2023-04-14 DIAGNOSIS — E7800 Pure hypercholesterolemia, unspecified: Secondary | ICD-10-CM

## 2023-04-14 DIAGNOSIS — E039 Hypothyroidism, unspecified: Secondary | ICD-10-CM

## 2023-04-14 NOTE — Patient Instructions (Signed)
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
## 2023-04-27 ENCOUNTER — Other Ambulatory Visit: Payer: Self-pay | Admitting: Adult Health

## 2023-04-27 ENCOUNTER — Encounter: Payer: Self-pay | Admitting: Cardiovascular Disease

## 2023-05-08 ENCOUNTER — Telehealth: Payer: Self-pay

## 2023-05-08 NOTE — Telephone Encounter (Signed)
Copied from CRM [id number]. Topic: Clinical - Request for Lab/Test Order >> May 08, 2023  2:06 PM Fuller Mandril wrote: Reason for CRM: Patient schedule appt for physical for 07/07/2023. States she usually has fasting labs, no order showing if required. She also wanted to go over restrictions for fasting and would like to know fi she can take her blood pressure medicine that morning. Thank You

## 2023-05-13 NOTE — Telephone Encounter (Signed)
Ok for pt to get lab work before appt.? Please advise

## 2023-05-15 NOTE — Telephone Encounter (Signed)
Patient notified of update  and verbalized understanding.

## 2023-06-02 IMAGING — MR MR ABDOMEN W/O CM
12 series · 48 of 48 positions shown · non-contrast
Comparison: CT abdomen and pelvis 07/01/2021

CLINICAL DATA: Left renal mass

EXAM:
MRI ABDOMEN WITHOUT CONTRAST
TECHNIQUE: Multiplanar multisequence MR imaging was performed without the
administration of intravenous contrast.

[Series 3: cor haste · coronal · 6.0mm · 1.25mm/px · 2 of 30 slices shown]
[im 1/30]
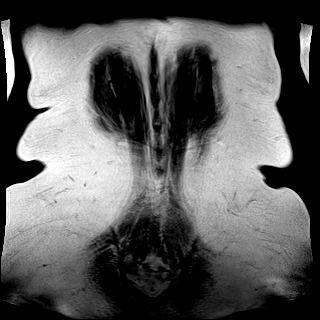
[im 30/30]
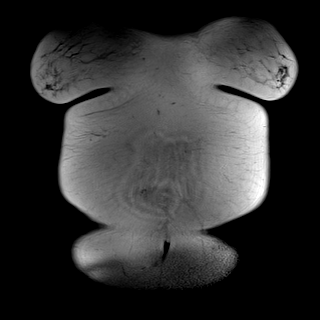

[Series 6: T2 fat-sat · axial · 6.0mm · 1.19mm/px · z∈[-202,+35]mm · 3 of 34 slices shown]
[im 1/34]
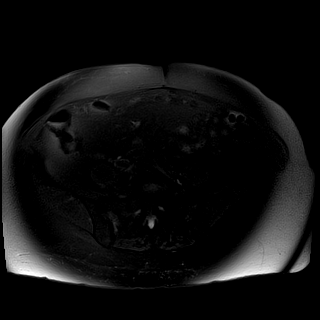
[im 17/34]
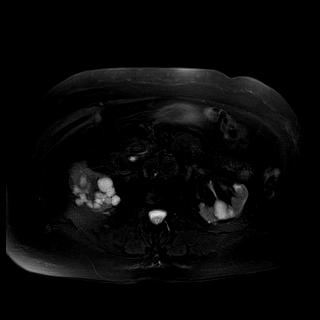
[im 34/34]
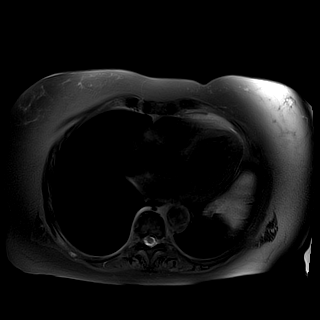

[Series 7: DWI · axial · 6.0mm · 1.42mm/px · z∈[-202,+35]mm · 3 of 34 slices shown (1 of 4)]
[im 1/34]
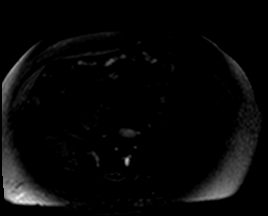
[im 17/34]
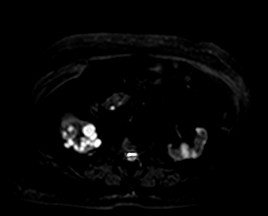
[im 34/34]
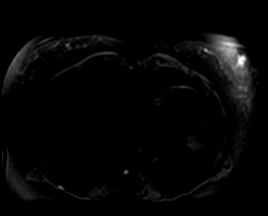

[Series 7: DWI · axial · 6.0mm · 1.42mm/px · z∈[-202,+35]mm · 3 of 34 slices shown (2 of 4)]
[im 1/34]
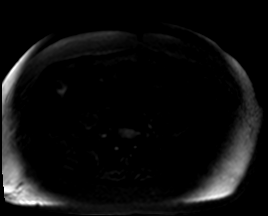
[im 17/34]
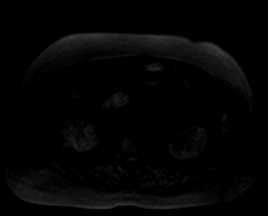
[im 34/34]
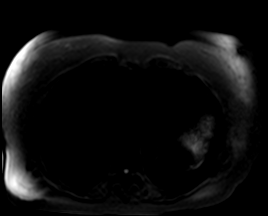

[Series 7: DWI · axial · 6.0mm · 1.42mm/px · z∈[-202,+35]mm · 3 of 34 slices shown (3 of 4)]
[im 1/34]
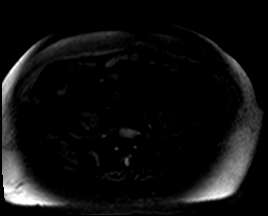
[im 17/34]
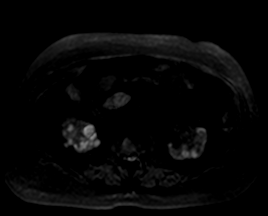
[im 34/34]
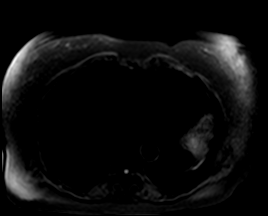

[Series 8: DWI · axial · 6.0mm · 1.42mm/px · z∈[-202,+35]mm · 3 of 34 slices shown (4 of 4)]
[im 1/34]
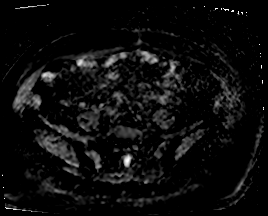
[im 17/34]
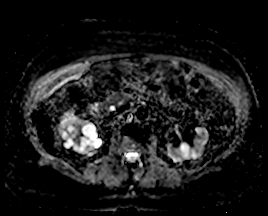
[im 34/34]
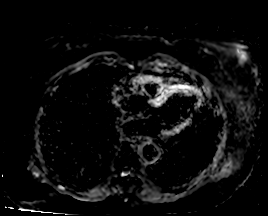

[Series 9: ax in and · axial · 3.0mm · 1.19mm/px · z∈[-202,+35]mm · 6 of 80 slices shown (1 of 2)]
[im 1/80]
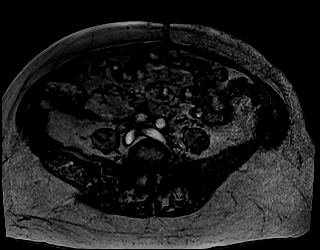
[im 16/80]
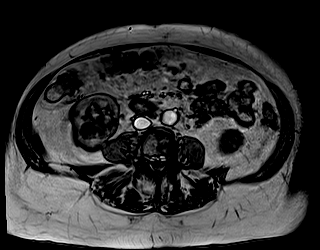
[im 32/80]
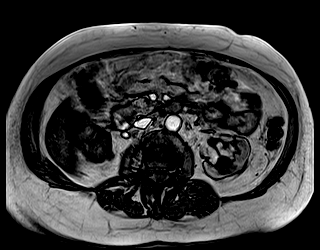
[im 48/80]
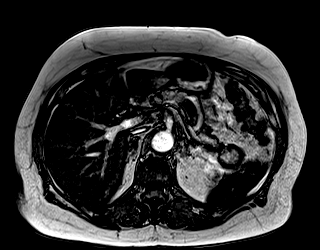
[im 64/80]
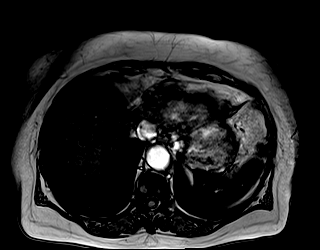
[im 80/80]
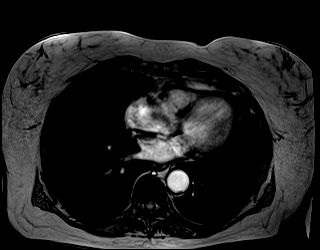

[Series 10: ax in and · axial · 3.0mm · 1.19mm/px · z∈[-202,+35]mm · 6 of 80 slices shown (2 of 2)]
[im 1/80]
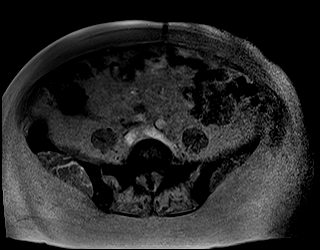
[im 16/80]
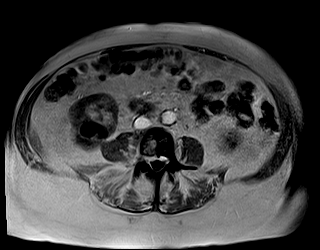
[im 32/80]
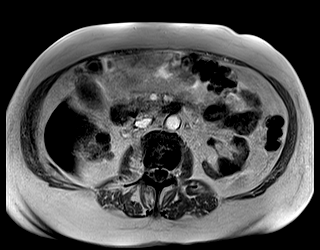
[im 48/80]
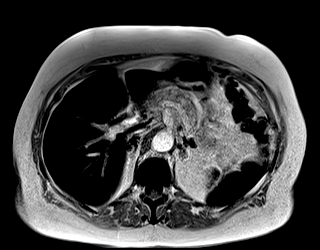
[im 64/80]
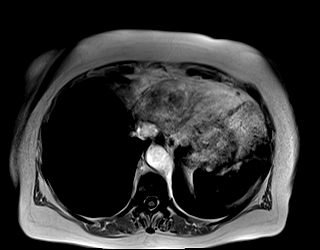
[im 80/80]
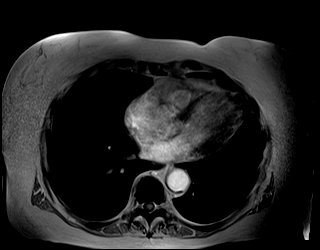

[Series 11: bSSFP · axial · 6.0mm · 0.74mm/px · z∈[-225,+57]mm · 4 of 48 slices shown]
[im 1/48]
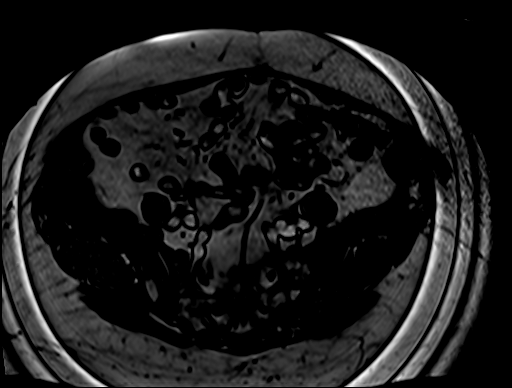
[im 16/48]
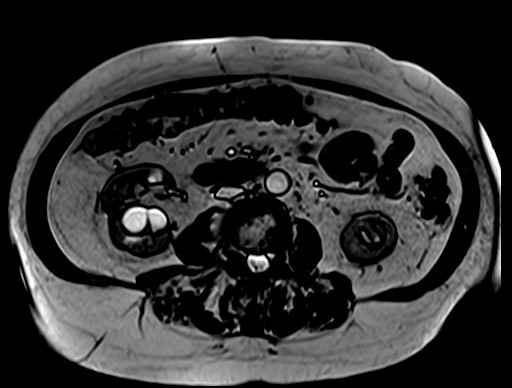
[im 32/48]
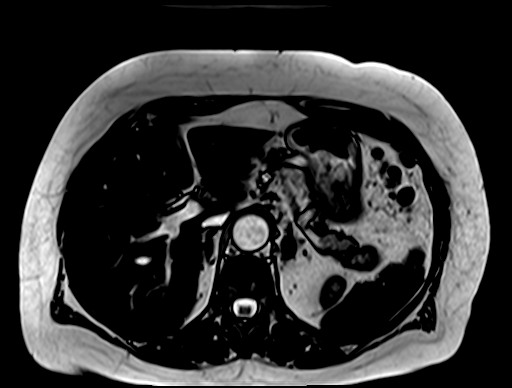
[im 48/48]
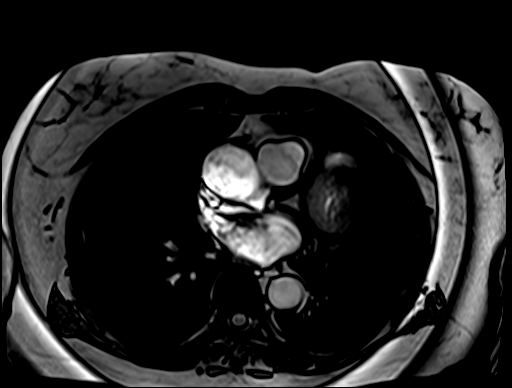

[Series 12: ax haste bh · axial · 6.0mm · 1.19mm/px · z∈[-202,+35]mm · 3 of 34 slices shown]
[im 1/34]
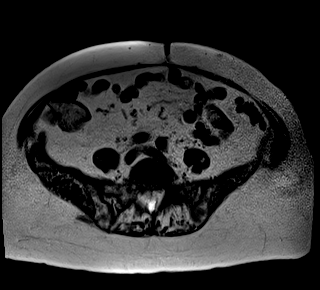
[im 17/34]
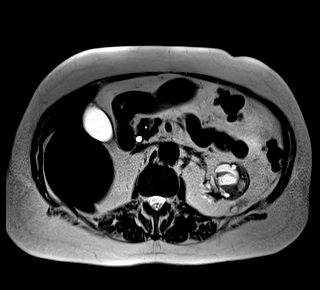
[im 34/34]
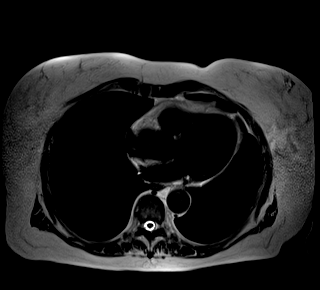

[Series 13: ax vibe_w · axial · 3.0mm · 1.19mm/px · z∈[-202,+35]mm · 6 of 80 slices shown]
[im 1/80]
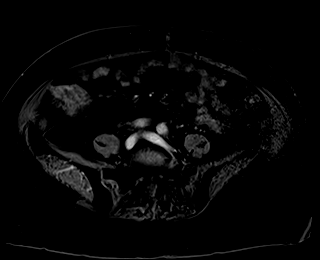
[im 16/80]
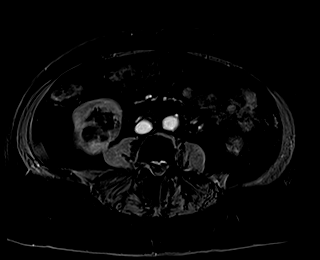
[im 32/80]
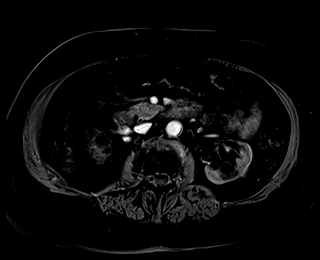
[im 48/80]
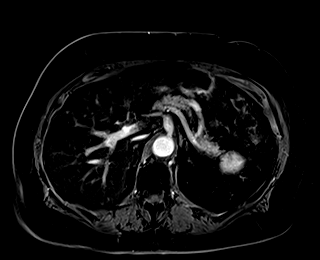
[im 64/80]
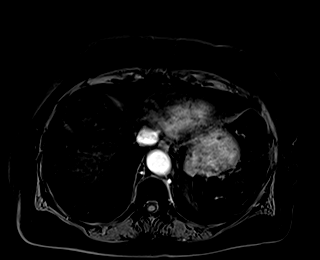
[im 80/80]
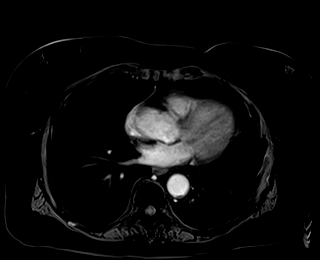

[Series 14: T1 dynamic · coronal · 3.0mm · 1.31mm/px · 6 of 72 slices shown]
[im 1/72]
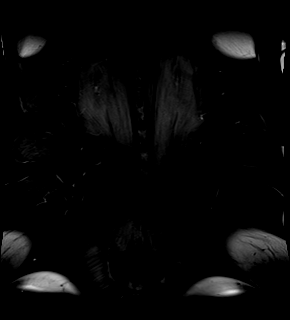
[im 15/72]
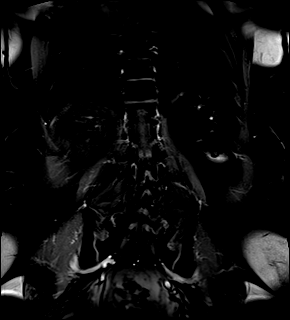
[im 29/72]
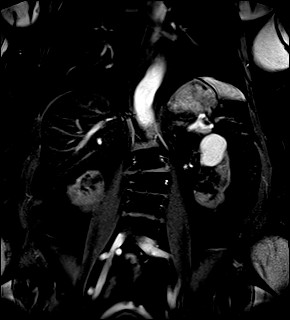
[im 43/72]
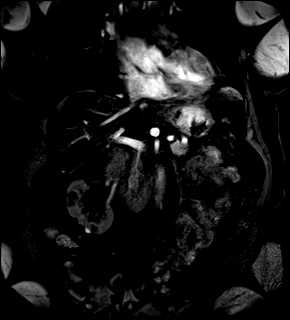
[im 57/72]
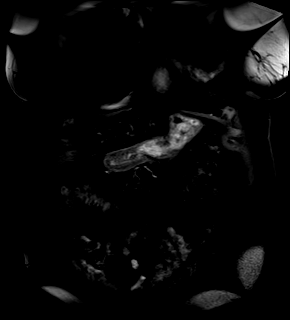
[im 72/72]
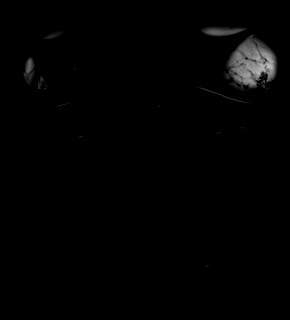

[48 of 48 positions shown; findings below may reference images not displayed]

FINDINGS: Study is limited due to motion and lack of IV contrast.

Lower chest: No acute findings.

Hepatobiliary: Liver is normal in size and contour with diffuse
markedly hypointense T2 signal of the parenchyma. No suspicious
hepatic mass identified. Gallbladder appears normal. No biliary
ductal dilatation.

Pancreas: No mass, inflammatory changes, or other parenchymal
abnormality identified.

Spleen: Normal size with marked hypointense T2 signal of the
parenchyma.

Adrenals/Urinary Tract: Stable appearance of the adrenal glands
including chronic 1.4 cm left adrenal gland nodule, likely adenoma.
Kidneys are atrophic, left worse than right. Numerous cystic lesions
of varying sizes and T1/T2 signal intensities identified throughout
both kidneys most likely representing simple and hemorrhagic cysts.
Evaluation for solid enhancing components not possible without
contrast. The heterogeneous nodular appearing lesion with small
cystic foci at the upper pole right kidney measures approximately
2.2 x 1.6 cm similar to most recent CT, however this was previously
a larger cyst measuring 3.4 x 3.6 cm visualized on CT chest
02/23/2020. No hydronephrosis identified bilaterally.

Stomach/Bowel: Colonic diverticulosis.  No bowel obstruction.

Vascular/Lymphatic: No pathologically enlarged lymph nodes
identified. No abdominal aortic aneurysm demonstrated.

Other:  No ascites.

Musculoskeletal: Heterogeneous bone marrow signal which is
nonspecific.
IMPRESSION: 1. Numerous bilateral renal cystic lesions as described.
Incompletely evaluated without contrast but most likely represent
simple and hemorrhagic cysts. The exophytic nodular lesion in
question at the upper pole left kidney likely represents involuted
cyst since the CT chest 5450, however can not be evaluated further.
Consider six-month follow-up examination with either CT without and
with contrast if the patient is on dialysis, or MRI with contrast if
not on dialysis and GFR is above 30.
2. Evidence of hemosiderosis involving the liver and spleen.
3. Colonic diverticulosis.

## 2023-06-20 ENCOUNTER — Other Ambulatory Visit: Payer: Self-pay | Admitting: Cardiovascular Disease

## 2023-06-20 ENCOUNTER — Encounter: Payer: Self-pay | Admitting: Cardiovascular Disease

## 2023-06-24 ENCOUNTER — Telehealth: Payer: Self-pay

## 2023-06-24 ENCOUNTER — Other Ambulatory Visit (HOSPITAL_COMMUNITY): Payer: Self-pay

## 2023-06-24 NOTE — Telephone Encounter (Signed)
 Patient Advocate Encounter   The patient was approved for a [employer] grant that will help cover the cost of REPATHA Total amount awarded, $2,500.  Effective: 07/02/23 - 06/30/24   [id number] [id number]   Patient informed via Dorcas Carrow, CPhT  Pharmacy Patient Advocate Specialist  Direct Number: [phone] Fax: [phone]

## 2023-06-24 NOTE — Telephone Encounter (Signed)
 Jasmine Wheeler renewed, see separate encounter for details

## 2023-07-07 ENCOUNTER — Ambulatory Visit (INDEPENDENT_AMBULATORY_CARE_PROVIDER_SITE_OTHER): Payer: Medicare HMO | Admitting: Adult Health

## 2023-07-07 ENCOUNTER — Encounter: Payer: Self-pay | Admitting: Adult Health

## 2023-07-07 VITALS — BP 130/80 | HR 54 | Temp 98.3°F | Ht 62.25 in | Wt 169.0 lb

## 2023-07-07 DIAGNOSIS — E782 Mixed hyperlipidemia: Secondary | ICD-10-CM | POA: Diagnosis not present

## 2023-07-07 DIAGNOSIS — I10 Essential (primary) hypertension: Secondary | ICD-10-CM | POA: Diagnosis not present

## 2023-07-07 DIAGNOSIS — I471 Supraventricular tachycardia, unspecified: Secondary | ICD-10-CM

## 2023-07-07 DIAGNOSIS — E039 Hypothyroidism, unspecified: Secondary | ICD-10-CM

## 2023-07-07 DIAGNOSIS — Z0000 Encounter for general adult medical examination without abnormal findings: Secondary | ICD-10-CM

## 2023-07-07 DIAGNOSIS — N1832 Chronic kidney disease, stage 3b: Secondary | ICD-10-CM

## 2023-07-07 DIAGNOSIS — J4520 Mild intermittent asthma, uncomplicated: Secondary | ICD-10-CM

## 2023-07-07 DIAGNOSIS — K219 Gastro-esophageal reflux disease without esophagitis: Secondary | ICD-10-CM | POA: Diagnosis not present

## 2023-07-07 DIAGNOSIS — R7303 Prediabetes: Secondary | ICD-10-CM

## 2023-07-07 LAB — LIPID PANEL
Cholesterol: 130 mg/dL (ref 0–200)
HDL: 48 mg/dL (ref >39.00)
LDL Cholesterol: 51 mg/dL (ref 0–99)
NonHDL: 81.5
Total CHOL/HDL Ratio: 3
Triglycerides: 154 mg/dL — ABNORMAL HIGH (ref 0.0–149.0)
VLDL: 30.8 mg/dL (ref 0.0–40.0)

## 2023-07-07 LAB — COMPREHENSIVE METABOLIC PANEL
ALT: 10 U/L (ref 0–35)
AST: 15 U/L (ref 0–37)
Albumin: 4.2 g/dL (ref 3.5–5.2)
Alkaline Phosphatase: 53 U/L (ref 39–117)
BUN: 25 mg/dL — ABNORMAL HIGH (ref 6–23)
CO2: 31 meq/L (ref 19–32)
Calcium: 9.4 mg/dL (ref 8.4–10.5)
Chloride: 99 meq/L (ref 96–112)
Creatinine, Ser: 1.62 mg/dL — ABNORMAL HIGH (ref 0.40–1.20)
GFR: 29.9 mL/min — ABNORMAL LOW (ref >60.00)
Glucose, Bld: 87 mg/dL (ref 70–99)
Potassium: 4 meq/L (ref 3.5–5.1)
Sodium: 139 meq/L (ref 135–145)
Total Bilirubin: 0.5 mg/dL (ref 0.2–1.2)
Total Protein: 6.4 g/dL (ref 6.0–8.3)

## 2023-07-07 LAB — HEMOGLOBIN A1C: Hgb A1c MFr Bld: 5.8 % (ref 4.6–6.5)

## 2023-07-07 LAB — CBC WITH DIFFERENTIAL/PLATELET
Basophils Absolute: 0 10*3/uL (ref 0.0–0.1)
Basophils Relative: 0.1 % (ref 0.0–3.0)
Eosinophils Absolute: 0.2 10*3/uL (ref 0.0–0.7)
Eosinophils Relative: 3.9 % (ref 0.0–5.0)
HCT: 34.2 % — ABNORMAL LOW (ref 36.0–46.0)
Hemoglobin: 11.4 g/dL — ABNORMAL LOW (ref 12.0–15.0)
Lymphocytes Relative: 22.7 % (ref 12.0–46.0)
Lymphs Abs: 1.1 10*3/uL (ref 0.7–4.0)
MCHC: 33.5 g/dL (ref 30.0–36.0)
MCV: 92.3 fl (ref 78.0–100.0)
Monocytes Absolute: 0.3 10*3/uL (ref 0.1–1.0)
Monocytes Relative: 7.4 % (ref 3.0–12.0)
Neutro Abs: 3.1 10*3/uL (ref 1.4–7.7)
Neutrophils Relative %: 65.9 % (ref 43.0–77.0)
Platelets: 189 10*3/uL (ref 150.0–400.0)
RBC: 3.7 Mil/uL — ABNORMAL LOW (ref 3.87–5.11)
RDW: 13.2 % (ref 11.5–15.5)
WBC: 4.6 10*3/uL (ref 4.0–10.5)

## 2023-07-07 LAB — TSH: TSH: 4.06 u[IU]/mL (ref 0.35–5.50)

## 2023-07-07 NOTE — Patient Instructions (Signed)
 It was great seeing you today   We will follow up with you regarding your lab work   Please let me know if you need anything
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
## 2023-07-08 ENCOUNTER — Other Ambulatory Visit: Payer: Self-pay | Admitting: Cardiovascular Disease

## 2023-07-08 ENCOUNTER — Encounter: Payer: Self-pay | Admitting: Adult Health

## 2023-07-08 DIAGNOSIS — I10 Essential (primary) hypertension: Secondary | ICD-10-CM

## 2023-07-20 DIAGNOSIS — I129 Hypertensive chronic kidney disease with stage 1 through stage 4 chronic kidney disease, or unspecified chronic kidney disease: Secondary | ICD-10-CM | POA: Diagnosis not present

## 2023-07-20 DIAGNOSIS — N289 Disorder of kidney and ureter, unspecified: Secondary | ICD-10-CM | POA: Diagnosis not present

## 2023-07-20 DIAGNOSIS — N1832 Chronic kidney disease, stage 3b: Secondary | ICD-10-CM | POA: Diagnosis not present

## 2023-07-20 DIAGNOSIS — D631 Anemia in chronic kidney disease: Secondary | ICD-10-CM | POA: Diagnosis not present

## 2023-08-18 DIAGNOSIS — N905 Atrophy of vulva: Secondary | ICD-10-CM | POA: Diagnosis not present

## 2023-08-18 DIAGNOSIS — Z01419 Encounter for gynecological examination (general) (routine) without abnormal findings: Secondary | ICD-10-CM | POA: Diagnosis not present

## 2023-08-18 DIAGNOSIS — Z780 Asymptomatic menopausal state: Secondary | ICD-10-CM | POA: Diagnosis not present

## 2023-09-17 ENCOUNTER — Other Ambulatory Visit: Payer: Self-pay | Admitting: Obstetrics & Gynecology

## 2023-09-17 DIAGNOSIS — Z1231 Encounter for screening mammogram for malignant neoplasm of breast: Secondary | ICD-10-CM

## 2023-10-05 ENCOUNTER — Ambulatory Visit: Payer: Medicare HMO | Admitting: Urology

## 2023-10-05 ENCOUNTER — Telehealth: Payer: Self-pay

## 2023-10-05 NOTE — Telephone Encounter (Signed)
 Pt called b/c she believed she had an appt today and was notified via mychart that it is actually 06/23 pt was notified that her appointment looked as if it was rescheduled but the correct appt is 06/23 at 2:20 pm and pt voiced her understanding

## 2023-10-09 ENCOUNTER — Other Ambulatory Visit: Payer: Self-pay | Admitting: Adult Health

## 2023-10-12 ENCOUNTER — Ambulatory Visit: Payer: Medicare HMO | Admitting: Urology

## 2023-10-12 ENCOUNTER — Encounter: Payer: Self-pay | Admitting: Urology

## 2023-10-12 VITALS — BP 179/77 | HR 68

## 2023-10-12 DIAGNOSIS — N281 Cyst of kidney, acquired: Secondary | ICD-10-CM

## 2023-10-12 DIAGNOSIS — Z09 Encounter for follow-up examination after completed treatment for conditions other than malignant neoplasm: Secondary | ICD-10-CM | POA: Diagnosis not present

## 2023-10-12 DIAGNOSIS — Z87440 Personal history of urinary (tract) infections: Secondary | ICD-10-CM

## 2023-10-12 DIAGNOSIS — N390 Urinary tract infection, site not specified: Secondary | ICD-10-CM

## 2023-10-12 MED ORDER — CEPHALEXIN 250 MG PO CAPS: 250.0000 mg | ORAL_CAPSULE | Freq: Every day | ORAL | 11 refills | Status: AC

## 2023-10-12 MED ORDER — ESTRADIOL 0.1 MG/GM VA CREA: 1.0000 | TOPICAL_CREAM | VAGINAL | 12 refills | Status: AC
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
## 2023-10-13 LAB — URINALYSIS, ROUTINE W REFLEX MICROSCOPIC
Bilirubin, UA: NEGATIVE
Glucose, UA: NEGATIVE
Ketones, UA: NEGATIVE
Nitrite, UA: NEGATIVE
Protein,UA: NEGATIVE
RBC, UA: NEGATIVE
Specific Gravity, UA: 1.015 (ref 1.005–1.030)
Urobilinogen, Ur: 0.2 mg/dL (ref 0.2–1.0)
pH, UA: 6 (ref 5.0–7.5)

## 2023-10-13 LAB — MICROSCOPIC EXAMINATION: Bacteria, UA: NONE SEEN

## 2023-10-15 ENCOUNTER — Encounter: Payer: Self-pay | Admitting: Adult Health

## 2023-10-16 MED ORDER — FLUTICASONE PROPIONATE 50 MCG/ACT NA SUSP: 2.0000 | Freq: Every day | NASAL | 0 refills | Status: DC | PRN

## 2023-11-04 ENCOUNTER — Ambulatory Visit

## 2023-11-10 ENCOUNTER — Ambulatory Visit
Admission: RE | Admit: 2023-11-10 | Discharge: 2023-11-10 | Disposition: A | Discharge status: Home / Self Care | Source: Ambulatory Visit | Attending: Obstetrics & Gynecology | Admitting: Obstetrics & Gynecology

## 2023-11-10 DIAGNOSIS — Z1231 Encounter for screening mammogram for malignant neoplasm of breast: Secondary | ICD-10-CM | POA: Diagnosis not present

## 2023-12-23 ENCOUNTER — Ambulatory Visit (INDEPENDENT_AMBULATORY_CARE_PROVIDER_SITE_OTHER): Payer: Medicare HMO

## 2023-12-23 VITALS — Ht 62.0 in | Wt 166.0 lb

## 2023-12-23 DIAGNOSIS — Z0000 Encounter for general adult medical examination without abnormal findings: Secondary | ICD-10-CM | POA: Diagnosis not present
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
## 2023-12-23 NOTE — Patient Instructions (Addendum)
 Ms. Perras , Thank you for taking time out of your busy schedule to complete your Annual Wellness Visit with me. I enjoyed our conversation and look forward to speaking with you again next year. I, as well as your care team,  appreciate your ongoing commitment to your health goals. Please review the following plan we discussed and let me know if I can assist you in the future. Your Game plan/ To Do List    Referrals: If you haven't heard from the office you've been referred to, please reach out to them at the phone provided.   Follow up Visits: We will see or speak with you next year for your Next Medicare AWV with our clinical staff 12/28/24 @ 1p   Have you seen your provider in the last 6 months (3 months if uncontrolled diabetes)?   Clinician Recommendations:  Aim for 30 minutes of exercise or brisk walking, 6-8 glasses of water , and 5 servings of fruits and vegetables each day.       This is a list of the screenings recommended for you:  Health Maintenance  Topic Date Due   Flu Shot  11/20/2023   COVID-19 Vaccine (1 - 2024-25 season) Never done   Mammogram  11/09/2024   Medicare Annual Wellness Visit  12/22/2024   DTaP/Tdap/Td vaccine (4 - Tdap) 04/09/2030   Pneumococcal Vaccine for age over 66  Completed   DEXA scan (bone density measurement)  Completed   Zoster (Shingles) Vaccine  Completed   HPV Vaccine  Aged Out   Meningitis B Vaccine  Aged Out   Colon Cancer Screening  Discontinued   Hepatitis C Screening  Discontinued    Advanced directives: (Declined) Advance directive discussed with you today. Even though you declined this today, please call our office should you change your mind, and we can give you the proper paperwork for you to fill out. Advance Care Planning is important because it:  [x]  Makes sure you receive the medical care that is consistent with your values, goals, and preferences  [x]  It provides guidance to your family and loved ones and reduces their  decisional burden about whether or not they are making the right decisions based on your wishes.  Follow the link provided in your after visit summary or read over the paperwork we have mailed to you to help you started getting your Advance Directives in place. If you need assistance in completing these, please reach out to us  so that we can help you!  See attachments for Preventive Care and Fall Prevention Tips.

## 2024-01-01 ENCOUNTER — Ambulatory Visit (INDEPENDENT_AMBULATORY_CARE_PROVIDER_SITE_OTHER)

## 2024-01-01 DIAGNOSIS — Z23 Encounter for immunization: Secondary | ICD-10-CM | POA: Diagnosis not present

## 2024-01-18 ENCOUNTER — Other Ambulatory Visit: Payer: Self-pay | Admitting: Adult Health

## 2024-01-25 DIAGNOSIS — N189 Chronic kidney disease, unspecified: Secondary | ICD-10-CM | POA: Diagnosis not present

## 2024-01-25 DIAGNOSIS — N1832 Chronic kidney disease, stage 3b: Secondary | ICD-10-CM | POA: Diagnosis not present

## 2024-01-25 DIAGNOSIS — N2581 Secondary hyperparathyroidism of renal origin: Secondary | ICD-10-CM | POA: Diagnosis not present

## 2024-01-25 DIAGNOSIS — D631 Anemia in chronic kidney disease: Secondary | ICD-10-CM | POA: Diagnosis not present

## 2024-01-25 DIAGNOSIS — N289 Disorder of kidney and ureter, unspecified: Secondary | ICD-10-CM | POA: Diagnosis not present

## 2024-01-25 DIAGNOSIS — I129 Hypertensive chronic kidney disease with stage 1 through stage 4 chronic kidney disease, or unspecified chronic kidney disease: Secondary | ICD-10-CM | POA: Diagnosis not present

## 2024-01-27 ENCOUNTER — Encounter: Payer: Self-pay | Admitting: Adult Health

## 2024-01-28 MED ORDER — FLUTICASONE PROPIONATE 50 MCG/ACT NA SUSP: 2.0000 | Freq: Every day | NASAL | 0 refills | Status: DC | PRN

## 2024-02-01 DIAGNOSIS — L918 Other hypertrophic disorders of the skin: Secondary | ICD-10-CM | POA: Diagnosis not present

## 2024-02-01 DIAGNOSIS — L821 Other seborrheic keratosis: Secondary | ICD-10-CM | POA: Diagnosis not present

## 2024-02-01 DIAGNOSIS — D2262 Melanocytic nevi of left upper limb, including shoulder: Secondary | ICD-10-CM | POA: Diagnosis not present

## 2024-02-01 DIAGNOSIS — L814 Other melanin hyperpigmentation: Secondary | ICD-10-CM | POA: Diagnosis not present

## 2024-02-01 DIAGNOSIS — Z85820 Personal history of malignant melanoma of skin: Secondary | ICD-10-CM | POA: Diagnosis not present

## 2024-02-15 ENCOUNTER — Encounter: Payer: Self-pay | Admitting: Adult Health

## 2024-02-17 MED ORDER — FLUTICASONE PROPIONATE 50 MCG/ACT NA SUSP: 2.0000 | Freq: Every day | NASAL | 0 refills | Status: AC | PRN

## 2024-02-27 ENCOUNTER — Encounter: Payer: Self-pay | Admitting: Cardiovascular Disease

## 2024-02-27 DIAGNOSIS — E7800 Pure hypercholesterolemia, unspecified: Secondary | ICD-10-CM

## 2024-02-29 MED ORDER — REPATHA SURECLICK 140 MG/ML ~~LOC~~ SOAJ: 140.0000 mg | SUBCUTANEOUS | 0 refills | Status: DC

## 2024-03-03 DIAGNOSIS — Z79899 Other long term (current) drug therapy: Secondary | ICD-10-CM | POA: Diagnosis not present

## 2024-03-03 DIAGNOSIS — K219 Gastro-esophageal reflux disease without esophagitis: Secondary | ICD-10-CM | POA: Diagnosis not present

## 2024-03-03 DIAGNOSIS — I10 Essential (primary) hypertension: Secondary | ICD-10-CM | POA: Diagnosis not present

## 2024-03-03 DIAGNOSIS — E039 Hypothyroidism, unspecified: Secondary | ICD-10-CM | POA: Diagnosis not present

## 2024-03-03 DIAGNOSIS — Z7982 Long term (current) use of aspirin: Secondary | ICD-10-CM | POA: Diagnosis not present

## 2024-03-03 DIAGNOSIS — Z6830 Body mass index (BMI) 30.0-30.9, adult: Secondary | ICD-10-CM | POA: Diagnosis not present

## 2024-03-03 DIAGNOSIS — J45909 Unspecified asthma, uncomplicated: Secondary | ICD-10-CM | POA: Diagnosis not present

## 2024-03-03 DIAGNOSIS — M1990 Unspecified osteoarthritis, unspecified site: Secondary | ICD-10-CM | POA: Diagnosis not present

## 2024-03-03 DIAGNOSIS — K589 Irritable bowel syndrome without diarrhea: Secondary | ICD-10-CM | POA: Diagnosis not present

## 2024-03-03 DIAGNOSIS — E669 Obesity, unspecified: Secondary | ICD-10-CM | POA: Diagnosis not present

## 2024-03-03 DIAGNOSIS — N952 Postmenopausal atrophic vaginitis: Secondary | ICD-10-CM | POA: Diagnosis not present

## 2024-03-03 DIAGNOSIS — Z85820 Personal history of malignant melanoma of skin: Secondary | ICD-10-CM | POA: Diagnosis not present

## 2024-03-03 DIAGNOSIS — R32 Unspecified urinary incontinence: Secondary | ICD-10-CM | POA: Diagnosis not present

## 2024-03-03 DIAGNOSIS — Z79890 Hormone replacement therapy (postmenopausal): Secondary | ICD-10-CM | POA: Diagnosis not present

## 2024-03-03 DIAGNOSIS — N390 Urinary tract infection, site not specified: Secondary | ICD-10-CM | POA: Diagnosis not present

## 2024-03-03 DIAGNOSIS — E785 Hyperlipidemia, unspecified: Secondary | ICD-10-CM | POA: Diagnosis not present

## 2024-03-14 ENCOUNTER — Other Ambulatory Visit: Payer: Self-pay | Admitting: Adult Health

## 2024-03-30 ENCOUNTER — Other Ambulatory Visit: Payer: Self-pay | Admitting: Cardiovascular Disease

## 2024-03-30 ENCOUNTER — Other Ambulatory Visit: Payer: Self-pay | Admitting: Internal Medicine

## 2024-04-16 ENCOUNTER — Other Ambulatory Visit: Payer: Self-pay | Admitting: Cardiovascular Disease

## 2024-04-16 DIAGNOSIS — I10 Essential (primary) hypertension: Secondary | ICD-10-CM

## 2024-05-09 ENCOUNTER — Encounter: Payer: Self-pay | Admitting: Cardiovascular Disease

## 2024-05-09 ENCOUNTER — Ambulatory Visit: Attending: Cardiovascular Disease | Admitting: Cardiovascular Disease

## 2024-05-09 VITALS — BP 166/90 | HR 62 | Ht 62.5 in | Wt 168.0 lb

## 2024-05-09 DIAGNOSIS — I700 Atherosclerosis of aorta: Secondary | ICD-10-CM

## 2024-05-09 DIAGNOSIS — I7121 Aneurysm of the ascending aorta, without rupture: Secondary | ICD-10-CM | POA: Diagnosis not present

## 2024-05-09 DIAGNOSIS — I471 Supraventricular tachycardia, unspecified: Secondary | ICD-10-CM

## 2024-05-09 DIAGNOSIS — N1832 Chronic kidney disease, stage 3b: Secondary | ICD-10-CM | POA: Diagnosis not present

## 2024-05-09 DIAGNOSIS — I452 Bifascicular block: Secondary | ICD-10-CM

## 2024-05-09 DIAGNOSIS — E039 Hypothyroidism, unspecified: Secondary | ICD-10-CM | POA: Diagnosis not present

## 2024-05-09 DIAGNOSIS — I10 Essential (primary) hypertension: Secondary | ICD-10-CM | POA: Diagnosis not present

## 2024-05-09 DIAGNOSIS — E7800 Pure hypercholesterolemia, unspecified: Secondary | ICD-10-CM | POA: Diagnosis not present

## 2024-05-09 DIAGNOSIS — I351 Nonrheumatic aortic (valve) insufficiency: Secondary | ICD-10-CM

## 2024-05-09 NOTE — Patient Instructions (Addendum)
 Medication Instructions:  No changes *If you need a refill on your cardiac medications before your next appointment, please call your pharmacy*  Lab Work: None ordered If you have labs (blood work) drawn today and your tests are completely normal, you will receive your results only by: MyChart Message (if you have MyChart) OR A paper copy in the mail If you have any lab test that is abnormal or we need to change your treatment, we will call you to review the results.  Testing/Procedures: Your physician has requested that you have an echocardiogram in June. Echocardiography is a painless test that uses sound waves to create images of your heart. It provides your doctor with information about the size and shape of your heart and how well your hearts chambers and valves are working. This procedure takes approximately one hour. There are no restrictions for this procedure. Please do NOT wear cologne, perfume, aftershave, or lotions (deodorant is allowed). Please arrive 15 minutes prior to your appointment time.  Please note: We ask at that you not bring children with you during ultrasound (echo/ vascular) testing. Due to room size and safety concerns, children are not allowed in the ultrasound rooms during exams. Our front office staff cannot provide observation of children in our lobby area while testing is being conducted. An adult accompanying a patient to their appointment will only be allowed in the ultrasound room at the discretion of the ultrasound technician under special circumstances. We apologize for any inconvenience.   Follow-Up: At [facility], you and your health needs are our priority.  As part of our continuing mission to provide you with exceptional heart care, our providers are all part of one team.  This team includes your primary Cardiologist (physician) and Advanced Practice Providers or APPs (Physician Assistants and Nurse Practitioners) who all work together to  provide you with the care you need, when you need it.  Your next appointment:   1 year(s)  Provider:   Jerel Balding, MD    We recommend signing up for the patient portal called MyChart.  Sign up information is provided on this After Visit Summary.  MyChart is used to connect with patients for Virtual Visits (Telemedicine).  Patients are able to view lab/test results, encounter notes, upcoming appointments, etc.  Non-urgent messages can be sent to your provider as well.   To learn more about what you can do with MyChart, go to [email or web address].
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
## 2024-05-14 DIAGNOSIS — I700 Atherosclerosis of aorta: Secondary | ICD-10-CM | POA: Insufficient documentation

## 2024-05-19 ENCOUNTER — Encounter: Payer: Self-pay | Admitting: Cardiovascular Disease

## 2024-05-19 DIAGNOSIS — E7800 Pure hypercholesterolemia, unspecified: Secondary | ICD-10-CM

## 2024-05-20 MED ORDER — REPATHA SURECLICK 140 MG/ML ~~LOC~~ SOAJ: 140.0000 mg | SUBCUTANEOUS | 2 refills | Status: AC

## 2024-05-27 NOTE — Progress Notes (Signed)
 Jasmine Wheeler                                          MRN: [id number]   05/27/2024   The [facility] Quality Team Specialist reviewed this patient medical record for the purposes of chart review for care gap closure. The following were reviewed: chart review for care gap closure-controlling blood pressure.    [facility] Quality Team

## 2024-07-07 ENCOUNTER — Encounter: Admitting: Adult Health

## 2024-10-10 ENCOUNTER — Ambulatory Visit (HOSPITAL_COMMUNITY)

## 2024-10-12 ENCOUNTER — Ambulatory Visit: Admitting: Urology

## 2024-12-28 ENCOUNTER — Ambulatory Visit
